# Patient Record
Sex: Male | Born: 1960 | Race: White | Hispanic: No | Smoking: Former smoker
Health system: Southern US, Community
[De-identification: ages and names within clinical notes are randomized; demographics above are authoritative.]

## PROBLEM LIST (undated history)

## (undated) DIAGNOSIS — Z125 Encounter for screening for malignant neoplasm of prostate: Secondary | ICD-10-CM

## (undated) DIAGNOSIS — N281 Cyst of kidney, acquired: Principal | ICD-10-CM

## (undated) DIAGNOSIS — I10 Essential (primary) hypertension: Secondary | ICD-10-CM

## (undated) DIAGNOSIS — M546 Pain in thoracic spine: Secondary | ICD-10-CM

## (undated) DIAGNOSIS — F40240 Claustrophobia: Secondary | ICD-10-CM

## (undated) DIAGNOSIS — K219 Gastro-esophageal reflux disease without esophagitis: Secondary | ICD-10-CM

## (undated) DIAGNOSIS — R030 Elevated blood-pressure reading, without diagnosis of hypertension: Secondary | ICD-10-CM

## (undated) DIAGNOSIS — U071 COVID-19: Secondary | ICD-10-CM

## (undated) DIAGNOSIS — M5416 Radiculopathy, lumbar region: Secondary | ICD-10-CM

## (undated) DIAGNOSIS — M5441 Lumbago with sciatica, right side: Secondary | ICD-10-CM

## (undated) DIAGNOSIS — Z765 Malingerer [conscious simulation]: Secondary | ICD-10-CM

## (undated) DIAGNOSIS — D649 Anemia, unspecified: Secondary | ICD-10-CM

## (undated) DIAGNOSIS — K649 Unspecified hemorrhoids: Secondary | ICD-10-CM

## (undated) DIAGNOSIS — M5020 Other cervical disc displacement, unspecified cervical region: Secondary | ICD-10-CM

## (undated) DIAGNOSIS — F419 Anxiety disorder, unspecified: Secondary | ICD-10-CM

## (undated) DIAGNOSIS — Z5189 Encounter for other specified aftercare: Secondary | ICD-10-CM

## (undated) HISTORY — PX: CERVICAL FUSION: SHX112

## (undated) HISTORY — DX: Encounter for other specified aftercare: Z51.89

## (undated) HISTORY — PX: SEPTOPLASTY: SUR1290

## (undated) HISTORY — PX: FRACTURE SURGERY: SHX138

---

## 1995-04-30 HISTORY — PX: OTHER SURGICAL HISTORY: SHX169

## 2000-03-16 ENCOUNTER — Encounter: Payer: Self-pay | Admitting: Emergency Medicine

## 2000-03-16 ENCOUNTER — Emergency Department (HOSPITAL_COMMUNITY): Admission: EM | Admit: 2000-03-16 | Discharge: 2000-03-16 | Payer: Self-pay | Admitting: Emergency Medicine

## 2006-10-29 ENCOUNTER — Emergency Department: Payer: Self-pay | Admitting: Internal Medicine

## 2006-10-29 ENCOUNTER — Other Ambulatory Visit: Payer: Self-pay

## 2009-11-09 ENCOUNTER — Emergency Department: Payer: Self-pay | Admitting: Emergency Medicine

## 2010-02-04 ENCOUNTER — Emergency Department: Payer: Self-pay | Admitting: Emergency Medicine

## 2010-11-07 ENCOUNTER — Emergency Department: Payer: Self-pay | Admitting: Emergency Medicine

## 2011-03-12 ENCOUNTER — Emergency Department: Payer: Self-pay | Admitting: Unknown Physician Specialty

## 2011-04-13 ENCOUNTER — Emergency Department: Payer: Self-pay

## 2011-04-13 LAB — CBC
HCT: 40.8 % (ref 40.0–52.0)
HGB: 13.5 g/dL (ref 13.0–18.0)
MCH: 30.6 pg (ref 26.0–34.0)
MCHC: 33.1 g/dL (ref 32.0–36.0)
MCV: 93 fL (ref 80–100)
Platelet: 188 10*3/uL (ref 150–440)
RBC: 4.4 10*6/uL (ref 4.40–5.90)
RDW: 13.8 % (ref 11.5–14.5)
WBC: 11.9 10*3/uL — ABNORMAL HIGH (ref 3.8–10.6)

## 2011-04-13 LAB — URINALYSIS, COMPLETE
Bilirubin,UR: NEGATIVE
Glucose,UR: NEGATIVE mg/dL (ref 0–75)
Hyaline Cast: 2
Leukocyte Esterase: NEGATIVE
Nitrite: NEGATIVE
Ph: 5 (ref 4.5–8.0)
Protein: 100
RBC,UR: 3 /HPF (ref 0–5)
Specific Gravity: 1.029 (ref 1.003–1.030)
Squamous Epithelial: NONE SEEN
WBC UR: 3 /HPF (ref 0–5)

## 2011-04-13 LAB — LIPASE, BLOOD: Lipase: 167 U/L (ref 73–393)

## 2011-04-13 LAB — COMPREHENSIVE METABOLIC PANEL
Albumin: 3.4 g/dL (ref 3.4–5.0)
Alkaline Phosphatase: 105 U/L (ref 50–136)
Anion Gap: 10 (ref 7–16)
BUN: 19 mg/dL — ABNORMAL HIGH (ref 7–18)
Bilirubin,Total: 0.7 mg/dL (ref 0.2–1.0)
Calcium, Total: 8.7 mg/dL (ref 8.5–10.1)
Chloride: 99 mmol/L (ref 98–107)
Co2: 29 mmol/L (ref 21–32)
Creatinine: 1.28 mg/dL (ref 0.60–1.30)
EGFR (African American): >60
EGFR (Non-African Amer.): >60
Glucose: 102 mg/dL — ABNORMAL HIGH (ref 65–99)
Osmolality: 278 (ref 275–301)
Potassium: 3.6 mmol/L (ref 3.5–5.1)
SGOT(AST): 43 U/L — ABNORMAL HIGH (ref 15–37)
SGPT (ALT): 40 U/L
Sodium: 138 mmol/L (ref 136–145)
Total Protein: 7.4 g/dL (ref 6.4–8.2)

## 2014-01-22 ENCOUNTER — Inpatient Hospital Stay: Payer: Self-pay | Admitting: Internal Medicine

## 2014-01-22 LAB — BASIC METABOLIC PANEL
Anion Gap: 7 (ref 7–16)
BUN: 6 mg/dL — AB (ref 7–18)
CREATININE: 0.9 mg/dL (ref 0.60–1.30)
Calcium, Total: 8.4 mg/dL — ABNORMAL LOW (ref 8.5–10.1)
Chloride: 105 mmol/L (ref 98–107)
Co2: 27 mmol/L (ref 21–32)
EGFR (African American): >60
EGFR (Non-African Amer.): >60
Glucose: 107 mg/dL — ABNORMAL HIGH (ref 65–99)
Osmolality: 276 (ref 275–301)
POTASSIUM: 3.8 mmol/L (ref 3.5–5.1)
Sodium: 139 mmol/L (ref 136–145)

## 2014-01-22 LAB — CBC WITH DIFFERENTIAL/PLATELET
BASOS ABS: 2 %
HCT: 24.4 % — ABNORMAL LOW (ref 40.0–52.0)
HGB: 6.9 g/dL — AB (ref 13.0–18.0)
Lymphocytes: 20 %
MCH: 19.6 pg — ABNORMAL LOW (ref 26.0–34.0)
MCHC: 28.1 g/dL — ABNORMAL LOW (ref 32.0–36.0)
MCV: 70 fL — ABNORMAL LOW (ref 80–100)
MONOS PCT: 6 %
Platelet: 309 10*3/uL (ref 150–440)
RBC: 3.5 10*6/uL — ABNORMAL LOW (ref 4.40–5.90)
RDW: 17.2 % — ABNORMAL HIGH (ref 11.5–14.5)
SEGMENTED NEUTROPHILS: 72 %
WBC: 5 10*3/uL (ref 3.8–10.6)

## 2014-01-22 LAB — URINALYSIS, COMPLETE
BLOOD: NEGATIVE
Bacteria: NONE SEEN
Bilirubin,UR: NEGATIVE
Glucose,UR: NEGATIVE mg/dL (ref 0–75)
Ketone: NEGATIVE
Leukocyte Esterase: NEGATIVE
Nitrite: NEGATIVE
PH: 6 (ref 4.5–8.0)
PROTEIN: NEGATIVE
RBC,UR: NONE SEEN /HPF (ref 0–5)
SPECIFIC GRAVITY: 1.003 (ref 1.003–1.030)
Squamous Epithelial: NONE SEEN
WBC UR: <1 /HPF (ref 0–5)

## 2014-01-22 LAB — TROPONIN I: Troponin-I: <0.02 ng/mL

## 2014-01-23 LAB — BASIC METABOLIC PANEL
ANION GAP: 7 (ref 7–16)
BUN: 5 mg/dL — ABNORMAL LOW (ref 7–18)
CALCIUM: 8 mg/dL — AB (ref 8.5–10.1)
CO2: 28 mmol/L (ref 21–32)
Chloride: 107 mmol/L (ref 98–107)
Creatinine: 0.84 mg/dL (ref 0.60–1.30)
Glucose: 100 mg/dL — ABNORMAL HIGH (ref 65–99)
OSMOLALITY: 280 (ref 275–301)
Potassium: 3.9 mmol/L (ref 3.5–5.1)
Sodium: 142 mmol/L (ref 136–145)

## 2014-01-23 LAB — CBC WITH DIFFERENTIAL/PLATELET
BASOS ABS: 0.1 10*3/uL (ref 0.0–0.1)
Basophil %: 1.5 %
EOS ABS: 0.1 10*3/uL (ref 0.0–0.7)
Eosinophil %: 1.3 %
HCT: 23.7 % — ABNORMAL LOW (ref 40.0–52.0)
HGB: 6.9 g/dL — AB (ref 13.0–18.0)
LYMPHS ABS: 1.5 10*3/uL (ref 1.0–3.6)
Lymphocyte %: 28 %
MCH: 20.8 pg — ABNORMAL LOW (ref 26.0–34.0)
MCHC: 29 g/dL — ABNORMAL LOW (ref 32.0–36.0)
MCV: 72 fL — ABNORMAL LOW (ref 80–100)
MONO ABS: 0.7 x10 3/mm (ref 0.2–1.0)
Monocyte %: 12.7 %
NEUTROS ABS: 3.1 10*3/uL (ref 1.4–6.5)
Neutrophil %: 56.5 %
Platelet: 271 10*3/uL (ref 150–440)
RBC: 3.3 10*6/uL — AB (ref 4.40–5.90)
RDW: 17.8 % — ABNORMAL HIGH (ref 11.5–14.5)
WBC: 5.5 10*3/uL (ref 3.8–10.6)

## 2014-01-23 LAB — HEMOGLOBIN
HGB: 7.9 g/dL — ABNORMAL LOW (ref 13.0–18.0)
HGB: 9 g/dL — AB (ref 13.0–18.0)

## 2014-01-23 LAB — IRON: Iron: 141 ug/dL (ref 65–175)

## 2014-01-23 LAB — HEMATOCRIT
HCT: 27.8 % — AB (ref 40.0–52.0)
HCT: 30.9 % — ABNORMAL LOW (ref 40.0–52.0)

## 2014-01-23 LAB — FERRITIN: Ferritin (ARMC): 3 ng/mL — ABNORMAL LOW (ref 8–388)

## 2014-01-24 ENCOUNTER — Inpatient Hospital Stay: Payer: Self-pay | Admitting: Internal Medicine

## 2014-01-24 LAB — CBC WITH DIFFERENTIAL/PLATELET
BASOS ABS: 0.1 10*3/uL (ref 0.0–0.1)
BASOS ABS: 0.2 10*3/uL — AB (ref 0.0–0.1)
Basophil %: 1.4 %
Basophil %: 2.3 %
EOS ABS: 0.1 10*3/uL (ref 0.0–0.7)
EOS PCT: 2.1 %
Eosinophil #: 0.1 10*3/uL (ref 0.0–0.7)
Eosinophil %: 2.1 %
HCT: 24.3 % — AB (ref 40.0–52.0)
HCT: 30 % — ABNORMAL LOW (ref 40.0–52.0)
HGB: 7.1 g/dL — ABNORMAL LOW (ref 13.0–18.0)
HGB: 8.6 g/dL — ABNORMAL LOW (ref 13.0–18.0)
LYMPHS PCT: 27.5 %
Lymphocyte #: 2 10*3/uL (ref 1.0–3.6)
Lymphocyte #: 1.6 10*3/uL (ref 1.0–3.6)
Lymphocyte %: 21.7 %
MCH: 21.6 pg — ABNORMAL LOW (ref 26.0–34.0)
MCH: 21.4 pg — ABNORMAL LOW (ref 26.0–34.0)
MCHC: 29.3 g/dL — AB (ref 32.0–36.0)
MCHC: 28.8 g/dL — AB (ref 32.0–36.0)
MCV: 74 fL — ABNORMAL LOW (ref 80–100)
MCV: 75 fL — AB (ref 80–100)
MONOS PCT: 11.6 %
Monocyte #: 0.8 x10 3/mm (ref 0.2–1.0)
Monocyte #: 0.7 x10 3/mm (ref 0.2–1.0)
Monocyte %: 10.3 %
NEUTROS ABS: 4.1 10*3/uL (ref 1.4–6.5)
Neutrophil #: 4.6 10*3/uL (ref 1.4–6.5)
Neutrophil %: 57.4 %
Neutrophil %: 63.6 %
PLATELETS: 252 10*3/uL (ref 150–440)
Platelet: 327 10*3/uL (ref 150–440)
RBC: 3.3 10*6/uL — ABNORMAL LOW (ref 4.40–5.90)
RBC: 4.03 10*6/uL — ABNORMAL LOW (ref 4.40–5.90)
RDW: 18.9 % — AB (ref 11.5–14.5)
RDW: 19.1 % — AB (ref 11.5–14.5)
WBC: 7.1 10*3/uL (ref 3.8–10.6)
WBC: 7.2 10*3/uL (ref 3.8–10.6)

## 2014-01-24 LAB — HEMOGLOBIN: HGB: 8.8 g/dL — ABNORMAL LOW (ref 13.0–18.0)

## 2014-01-24 LAB — COMPREHENSIVE METABOLIC PANEL
ANION GAP: 5 — AB (ref 7–16)
Albumin: 3.8 g/dL (ref 3.4–5.0)
Alkaline Phosphatase: 71 U/L
BILIRUBIN TOTAL: 1.2 mg/dL — AB (ref 0.2–1.0)
BUN: 7 mg/dL (ref 7–18)
CALCIUM: 8.5 mg/dL (ref 8.5–10.1)
CHLORIDE: 106 mmol/L (ref 98–107)
CO2: 30 mmol/L (ref 21–32)
CREATININE: 0.86 mg/dL (ref 0.60–1.30)
Glucose: 100 mg/dL — ABNORMAL HIGH (ref 65–99)
Osmolality: 279 (ref 275–301)
Potassium: 4.3 mmol/L (ref 3.5–5.1)
SGOT(AST): 21 U/L (ref 15–37)
SGPT (ALT): 17 U/L
Sodium: 141 mmol/L (ref 136–145)
Total Protein: 7.2 g/dL (ref 6.4–8.2)

## 2014-01-24 LAB — TROPONIN I

## 2014-01-25 LAB — CBC WITH DIFFERENTIAL/PLATELET
BASOS PCT: 0.7 %
Basophil #: 0.1 10*3/uL (ref 0.0–0.1)
EOS PCT: 0.7 %
Eosinophil #: 0.1 10*3/uL (ref 0.0–0.7)
HCT: 27.5 % — AB (ref 40.0–52.0)
HGB: 8.1 g/dL — AB (ref 13.0–18.0)
LYMPHS ABS: 1.4 10*3/uL (ref 1.0–3.6)
Lymphocyte %: 10.1 %
MCH: 21.7 pg — ABNORMAL LOW (ref 26.0–34.0)
MCHC: 29.5 g/dL — ABNORMAL LOW (ref 32.0–36.0)
MCV: 73 fL — AB (ref 80–100)
Monocyte #: 0.9 x10 3/mm (ref 0.2–1.0)
Monocyte %: 7.1 %
Neutrophil #: 10.9 10*3/uL — ABNORMAL HIGH (ref 1.4–6.5)
Neutrophil %: 81.4 %
PLATELETS: 312 10*3/uL (ref 150–440)
RBC: 3.74 10*6/uL — AB (ref 4.40–5.90)
RDW: 19.4 % — ABNORMAL HIGH (ref 11.5–14.5)
WBC: 13.3 10*3/uL — ABNORMAL HIGH (ref 3.8–10.6)

## 2014-01-25 LAB — BASIC METABOLIC PANEL
ANION GAP: 8 (ref 7–16)
BUN: 9 mg/dL (ref 7–18)
CO2: 28 mmol/L (ref 21–32)
Calcium, Total: 8.7 mg/dL (ref 8.5–10.1)
Chloride: 106 mmol/L (ref 98–107)
Creatinine: 0.8 mg/dL (ref 0.60–1.30)
EGFR (African American): >60
EGFR (Non-African Amer.): >60
Glucose: 87 mg/dL (ref 65–99)
OSMOLALITY: 281 (ref 275–301)
Potassium: 4 mmol/L (ref 3.5–5.1)
Sodium: 142 mmol/L (ref 136–145)

## 2014-01-30 ENCOUNTER — Emergency Department: Payer: Self-pay | Admitting: Emergency Medicine

## 2014-01-30 LAB — BASIC METABOLIC PANEL
Anion Gap: 8 (ref 7–16)
BUN: 11 mg/dL (ref 7–18)
CREATININE: 0.81 mg/dL (ref 0.60–1.30)
Calcium, Total: 9.1 mg/dL (ref 8.5–10.1)
Chloride: 104 mmol/L (ref 98–107)
Co2: 28 mmol/L (ref 21–32)
GLUCOSE: 95 mg/dL (ref 65–99)
OSMOLALITY: 279 (ref 275–301)
Potassium: 4 mmol/L (ref 3.5–5.1)
Sodium: 140 mmol/L (ref 136–145)

## 2014-01-30 LAB — CBC WITH DIFFERENTIAL/PLATELET
Basophil #: 0.1 10*3/uL (ref 0.0–0.1)
Basophil %: 1.1 %
EOS ABS: 0 10*3/uL (ref 0.0–0.7)
EOS PCT: 0.5 %
HCT: 32.4 % — ABNORMAL LOW (ref 40.0–52.0)
HGB: 9.7 g/dL — AB (ref 13.0–18.0)
Lymphocyte #: 1.4 10*3/uL (ref 1.0–3.6)
Lymphocyte %: 18.4 %
MCH: 22 pg — ABNORMAL LOW (ref 26.0–34.0)
MCHC: 29.8 g/dL — ABNORMAL LOW (ref 32.0–36.0)
MCV: 74 fL — ABNORMAL LOW (ref 80–100)
Monocyte #: 0.7 x10 3/mm (ref 0.2–1.0)
Monocyte %: 9.6 %
NEUTROS PCT: 70.4 %
Neutrophil #: 5.4 10*3/uL (ref 1.4–6.5)
Platelet: 314 10*3/uL (ref 150–440)
RBC: 4.39 10*6/uL — ABNORMAL LOW (ref 4.40–5.90)
RDW: 21.2 % — AB (ref 11.5–14.5)
WBC: 7.7 10*3/uL (ref 3.8–10.6)

## 2014-01-30 LAB — TROPONIN I: Troponin-I: <0.02 ng/mL

## 2014-02-05 HISTORY — PX: COLONOSCOPY: SHX174

## 2014-03-06 ENCOUNTER — Encounter: Payer: Self-pay | Admitting: *Deleted

## 2014-03-15 ENCOUNTER — Ambulatory Visit (INDEPENDENT_AMBULATORY_CARE_PROVIDER_SITE_OTHER): Payer: Medicaid Other | Admitting: General Surgery

## 2014-03-15 ENCOUNTER — Encounter: Payer: Self-pay | Admitting: General Surgery

## 2014-03-15 VITALS — BP 104/58 | HR 84 | Resp 14 | Ht 69.0 in | Wt 144.0 lb

## 2014-03-15 DIAGNOSIS — K625 Hemorrhage of anus and rectum: Secondary | ICD-10-CM | POA: Diagnosis not present

## 2014-03-15 DIAGNOSIS — K648 Other hemorrhoids: Secondary | ICD-10-CM | POA: Diagnosis not present

## 2014-03-15 NOTE — Progress Notes (Signed)
Patient ID: Shane Munsonimothy Aubrey Murtaugh, male   DOB: 06/29/1960, 54 y.o.   MRN: [id number]  Chief Complaint  Patient presents with  . Rectal Problems    hemorrhoids    HPI Shane [name] is a 54 y.o. male  Here for problems with hemorrhoids. He has had problems with bleeding hemorrhoids since 1999. He has a history of repaired prolapsed rectum in 1997 and has had bleeding since then. He states that this comes and goes and can be a lot at times. He had a colonoscopy done in February 2016 and only hemorrhoids were found. He started taking stool softeners about 2 weeks ago. He reports the bleeding is greatly reduced after starting stool softeners.   The [facility] was reviewed. He was hospitalized and transfused in 2005. He presented with a history of rectal bleeding on January 18 at that time his hemoglobin was found to be 6.9. He was transfused and refused colonoscopy, planning to [facility]. He left AGAINST MEDICAL ADVICE returning 2 hours later because he was dizzy. No change in hemoglobin at that time (8.8 post transfusion). He subsequently did have a colonoscopy completed by Lynnae Prudeobert Elliott, M.D. Large internal hemorrhoids were identified that did not prolapse. Scant active bleeding.    HPI  Past Medical History  Diagnosis Date  . Encounter for blood transfusion 2005, 02/2014    Past Surgical History  Procedure Laterality Date  . Colonoscopy  2/16    Dr. Mechele CollinElliott  . Prolapsed rectum surgery  04/30/1995    [facility], trans abdominal mesh repair per patient report    No family history on file.  Social History History  Substance Use Topics  . Smoking status: Former Smoker -- 20 years    Quit date: 01/05/2005  . Smokeless tobacco: Current User  . Alcohol Use: 0.0 oz/week    0 Standard drinks or equivalent per week    Allergies  Allergen Reactions  . Codeine     Stomach pain     Current Outpatient Prescriptions  Medication Sig Dispense  Refill  . docusate sodium (COLACE) 100 MG capsule Take 250 mg by mouth 2 (two) times daily.     Marland Kitchen. HYDROcodone-acetaminophen (NORCO) 10-325 MG per tablet Take 1 tablet by mouth every 6 (six) hours.  0  . methocarbamol (ROBAXIN) 750 MG tablet      No current facility-administered medications for this visit.    Review of Systems Review of Systems  Constitutional: Positive for unexpected weight change (weight loss). Negative for fever, chills, diaphoresis, activity change, appetite change and fatigue.  Respiratory: Negative.   Cardiovascular: Negative.   Gastrointestinal: Positive for anal bleeding. Negative for nausea, vomiting, abdominal pain, diarrhea, constipation, blood in stool, abdominal distention and rectal pain.    Blood pressure 104/58, pulse 84, resp. rate 14, height 5\' 9"  (1.753 m), weight 144 lb (65.318 kg).  Physical Exam Physical Exam  Constitutional: He is oriented to person, place, and time. He appears well-developed and well-nourished.  Cardiovascular: Normal rate and regular rhythm.   Pulmonary/Chest: Effort normal and breath sounds normal.  Abdominal: A hernia is present. Hernia confirmed positive in the right inguinal area (Modest bulge with straining at the external ring. Reducible and standing position.) and confirmed positive in the left inguinal area (small impulse at the left internal ring with straining.).    Neurological: He is alert and oriented to person, place, and time.   Anoscopy was completed. The visualized lower rectal mucosa was  unremarkable. There were no clearly identifiable internal hemorrhoids although there is somewhat prominent mucosa just at the dentate line. No bleeding noted. No prolapse with vigorous straining.  Data Reviewed Discharge CBC dated 01/30/2014 showed a hemoglobin of 9.7 with an MCV of 74, white blood cell count 7700. Normal platelet count.  Laboratory studies dated 02/20/2014 from Dr. Jake Church office showed a hemoglobin of 11.6  with an MCV of 82. Platelet count of 316,000. Normal electrolytes and renal function.  Assessment    Recurrent rectal bleeding likely secondary to internal hemorrhoids, now resolved.  Long-standing inguinal hernias without change.  Reported pending disc surgery for chronic back pain.    Plan    The patient finds it making use of stool softeners results in marked improvement in his rectal bleeding. This has been encouraged. He should go ahead with planned spinal surgery with the idea that discontinuation of chronic narcotics will improve his bowel lamination and lessen the likelihood of straining.  With minimal findings on anoscopy today, no intervention is required.  The small, long-standing hernias by patient report, did not require urgent intervention.    Ref: Dr Schuyler Amor, Merrily Pew 03/17/2014, 8:46 AM

## 2014-03-17 ENCOUNTER — Encounter: Payer: Self-pay | Admitting: General Surgery

## 2014-05-06 NOTE — Discharge Summary (Signed)
PATIENT NAME:  Shane [name], Juda A MR#:  [id number] DATE OF BIRTH:  1960-07-17  ADMITTING DIAGNOSIS:  Bright red blood per rectum.   DISCHARGE DIAGNOSES: 1.  Bright red blood per rectum, possible internal hemorrhoids, possible recurrence of his rectal prolapse. The patient recommended to have a colonoscopy by gastroenterology, but he did not want to have a colonoscopy here. He wanted to [facility].  I called [facility] to [facility] to transfer the patient, but they are on diversion, so patient wanted me to discharge him home.  2.  Acute blood loss anemia, status post transfusion.  3.  Near syncope due to anemia.  4.  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  PERTINENT LABORATORY AND EVALUATIONS: Admitting glucose 107, BUN 6, creatinine 0.90, sodium 139, potassium 3.8, chloride 105, CO2 of 27, calcium 8.4, troponin less than 0.02. WBC 5.0, hemoglobin 6.9, platelet count was 309,000.  Most recent hemoglobin at 10:42 was 8.8.   HOSPITAL COURSE: Please refer to H and P done by the admitting physician. The patient is a 54 year old white male with previous history of lower GI bleeding related to internal hemorrhoids, prolapsed rectum, who presented with bright red blood per rectum ongoing for a while. The patient was admitted and transfused. He was symptomatic with his anemia. The patient also was seen by Dr. Mechele CollinElliott, who recommended a colonoscopy on Thursday. However, the patient stated that he wants to have all of his workup done [facility]. Since his hemoglobin is stable, he is not having any active bleeding, he is being discharged.  I also attempted to call [facility], but they were on diversion. He is doing well and is stable for discharge.   DISCHARGE MEDICATIONS: Methocarbamol 750, 1/2 tab p.o. at bedtime, acetaminophen and hydrocodone 325/10, 0.5-1 tab p.o. q. 6 p.r.n. for pain, Colace 100, 1 tab p.o. b.i.d. p.r.n. for constipation, ranitidine 150 daily.   DIET: Low sodium.   ACTIVITY:  As tolerated.   FOLLOWUP:  With primary MD in 1-2 weeks, if not [facility]. If rebleeds, will need to go to [facility] since he does not want to have evaluation here.   TIME SPENT: 35 minutes.    ____________________________ Lacie ScottsShreyang H. Allena KatzPatel, MD shp:LT D: 01/24/2014 14:01:03 ET T: 01/24/2014 16:19:37 ET JOB#: [id number]  cc: Allen Egerton H. Allena KatzPatel, MD, <Dictator> Charise CarwinSHREYANG H Selicia Windom MD ELECTRONICALLY SIGNED 02/01/2014 10:00

## 2014-05-06 NOTE — Discharge Summary (Signed)
PATIENT NAME:  Shane [name], Shane [name] MR#:  [id number] DATE OF BIRTH:  08-Sep-1960  DATE OF ADMISSION:  01/24/2014 DATE OF DISCHARGE:  01/25/2014  ADMITTING DIAGNOSES:  1.  Symptomatic anemia, likely from rectal bleed.  2.  Rectal bleed, likely from underlying hemorrhoids. 3.  Dizziness versus presyncope from severe anemia. 4.  Chronic neck pain.  DISCHARGE DIAGNOSES:  1.  Symptomatic anemia, likely from rectal bleed.  2.  Rectal bleed from underlying internal hemorrhoids.  3.  Presyncope versus dizziness, improved with intravenous fluids.  4.  Chronic neck pain.   PROCEDURES: Colonoscopy performed by Dr. Mechele Collin on 01/25/2014.   CONSULTATIONS: Gastroenterology.  BRIEF HISTORY AND PHYSICAL AND HOSPITAL COURSE: The patient is a 54 year old Caucasian male with a history of internal hemorrhoids and hemorrhoidal bleed who came into the ED for rectal bleeding from January 18. He left the hospital against medical advice with the hope that he will get colonoscopy done at [facility]. He left the hospital and within 2 hours he went to [employer], feeling dizzy, and came back to the ED. His hemoglobin went down  Please review history and physical for details. The patient is admitted to the hospital for dizziness, symptomatic anemia, and rectal bleed. He was placed on IV fluids and started on GoLYTELY for possible colonoscopy. Admitting physician, Dr. Sherryll Burger, has discussed with the on-call GI doctor, Dr. Bluford Kaufmann, and scheduled for colonoscopy. The patient's hemoglobin and hematocrit were monitored closely. The patient had colonoscopy done today by Dr. Mechele Collin. Colonoscopy revealed internal hemorrhoids. Dr. Mechele Collin has recommended the patient to follow up with surgery regarding his hemorrhoids. The patient's dizziness is resolved with IV fluids. Hemoglobin being stable at 8.1 , no blood transfusions were provided. The patient was recommended to take iron supplements. GI has recommended that the patient can be discharged from their  standpoint. The patient was tolerating the diet well and no other episodes of bleeding were noted. Patient was ambulating in the hallway without any difficulty. Denies any abdominal pain. The patient was recommended to take iron supplements and over-the-counter Preparation H or Anusol rectal suppositories regarding his internal hemorrhoids. The patient was discharged home in stable condition. His vital signs were stable.   LABORATORY DATA AND IMAGING STUDIES: The patient's hemoglobin was at 8.8, 8.6, and 8.1. White count was elevated at 13.3, probably reactive.  The patient was afebrile. BMP was normal.  MEDICATIONS AT THE TIME OF DISCHARGE: Iron supplement Feosol over the counter 1 tablet p.o. once daily, hydrocortisone 25 mg rectal suppository rectally 2 times a day as needed for internal hemorrhoids, ranitidine 150 mg 1 tablet p.o. once daily in a.m., Colace 100 mg 1 tablet p.o. 2 times a day as needed for constipation, Tylenol with hydrocodone 325/10 mg 1/2 tablet to 1 tablet p.o. every 6 hours as needed for pain, methocarbamol 750 mg 1/2 tablet p.o. once daily at bedtime.  CODE STATUS: Full code.   DIET: Regular. Regular consistency.   ACTIVITY: As tolerated.   FOLLOWUP: Follow up with the primary care physician in a week, general surgery regarding internal hemorrhoids in 1 to 2 weeks as an outpatient, and gastroenterology in 2 to 4 weeks as needed.   Diagnosis and plan of care were discussed with the patient. He verbalized understanding of the plan.  TOTAL TIME SPENT ON THE DISCHARGE AND COORDINATION OF CARE: 45 minutes.   ____________________________ Ramonita Lab, MD ag:ST D: 01/25/2014 21:25:54 ET T: 01/25/2014 22:52:16 ET JOB#: [id number]  cc: Ramonita Lab, MD, <Dictator> Primary care physician  Gastroenterology Ramonita LabARUNA Kashia Brossard MD ELECTRONICALLY SIGNED 01/26/2014 19:03

## 2014-05-06 NOTE — Discharge Summary (Signed)
PATIENT NAME:  [name], Shane A MR#:  [id number] DATE OF BIRTH:  Mar 21, 1960  DATE OF ADMISSION:  01/22/2014 DATE OF DISCHARGE:  01/24/2014  ADMITTING DIAGNOSIS: Bright red blood per rectum.   DISCHARGE DIAGNOSES:  1.  Bright red blood per rectum due to a lower gastrointestinal bleed, possible causes include internal hemorrhoids and recurrence of rectal prolapse. The patient refused colonoscopy here. He stated that he will [facility] and [facility] this done.  2.  Acute blood loss anemia, symptomatic status post transfusion, hemoglobin stable.  3.  Chronic history of internal hemorrhoids.  4.  History of rectal prolapse.  5.  Chronic neck pain.  6.  History of lower gastrointestinal bleed due to internal hemorrhoids and prolapse in the past status post rectal prolapse surgery.   CONSULTANTS: Dr. Mechele CollinElliott.    PERTINENT LABORATORY AND EVALUATIONS:  Admitting glucose 107, BUN was 6, creatinine 0.90, sodium 139, potassium 3.8, hemoglobin was 6.9, platelet count was 309, and hemoglobin on discharge was 8.6.   HOSPITAL COURSE: Please refer to H and P done by me on admission. The patient is a 54 year old white male who was admitted with bright red blood per rectum. He has a history of internal hemorrhoids. He was admitted for further evaluation and treatment. The patient was transfused a total 2 units of packed RBCs and was seen in consultation by GI. He was recommended to stay in the hospital and have a colonoscopy; however, he stated that he wanted to [facility].  He wanted to be transferred there.  I recommended that he needed a colonoscopy regardless. The patient stated that I should call [facility].  I called [facility] for transfer; however, they were on diversion. He was very anxious and wanted to leave. So at this point he is being discharged to be followed up there and have his further evaluation. The patient was very anxious during the hospitalization and questioning his hemoglobin and other things. He is  stable for discharge.  DISCHARGE MEDICATIONS:  Methocarbamol 750 mg 0.5 at bedtime, acetaminophen hydrocodone 325/10 mg, 0.5 to 1 tab p.o. q.6 p.r.n. for pain, Colace 100 mg 1 tab p.o. b.i.d. p.r.n. for constipation, ranitidine 150 mg daily.   DIET: Low-sodium.   ACTIVITY: As tolerated.   FOLLOW UP:  Primary M.D. in 1 to 2 weeks, [facility] if no other M.D.    TIME SPENT: 35 minutes on this patient.    ____________________________ Lacie ScottsShreyang H. Allena KatzPatel, MD shp:at D: 01/25/2014 08:39:22 ET T: 01/25/2014 10:24:42 ET JOB#: [id number]  cc: Geet Hosking H. Allena KatzPatel, MD, <Dictator> Charise CarwinSHREYANG H Kennidee Heyne MD ELECTRONICALLY SIGNED 02/01/2014 10:01

## 2014-05-06 NOTE — Consult Note (Signed)
Pt with rectal bleeding and anemia with feeling weak and dizzy, hgb was 6.9 but now up to 9 with 2 units.  He had a large meal today so i will have to postpone his colonoscopy to Thursday.  Will prep tomorrow.  Possible neoplasm, possible prolapse, possible large internal hemorrhoids. etc.  Mom died of colon cancer.  Electronic Signatures: Scot JunElliott, Robert T (MD)  (Signed on 19-Jan-16 18:44)  Authored  Last Updated: 19-Jan-16 18:44 by Scot JunElliott, Robert T (MD)

## 2014-05-06 NOTE — H&P (Signed)
PATIENT NAME:  Shane [name], Shane A MR#:  [id number] DATE OF BIRTH:  05/04/1960  DATE OF ADMISSION:  01/24/2014  PRIMARY CARE PHYSICIAN:  Nonlocal.   REQUESTING PHYSICIAN:  Gwynneth AlbrightPhillip A Stafford, MD   CHIEF COMPLAINT: Dizziness.   HISTORY OF PRESENT ILLNESS: The patient is a 54 year old male with a known history of internal hemorrhoids and hemorrhoidal bleed who was in the hospital for rectal bleed from January 18, until this afternoon, when he left against medical advice, with the hope that he will get [facility]. He left less than 2 hours ago went to [employer], to exchange some of his clothes, got extremely dizzy and decided that he will not be able to make it to [facility], and came back to the Emergency Department. He is being readmitted for evaluation of rectal bleed. His hemoglobin is down to 8.6, from 8.8, this morning. He is agreeable to get colonoscopy here. He does feel dizzy but much better right now in the emergency bed. He does have tangential thought, and is going all over the place while talking in different subjects.   PAST MEDICAL HISTORY:  1.  Internal hemorrhoids.  2.  Hemorrhoidal bleed.  3.  Chronic neck pain.  4.  Rectal prolapse.   PAST SURGICAL HISTORY:  Rectal prolapse surgery.   ALLERGIES: AMOXICILLIN AND CODEINE.   PSYCHOSOCIAL HISTORY: Lives alone, no history of smoking, occasional alcohol, no illicit drug use.   FAMILY HISTORY: Mother died of intestinal cancer, history of diabetes mellitus also present.   REVIEW OF SYSTEMS:   CONSTITUTIONAL: No fever, positive fatigue and weakness.  EYES: No blurred or double vision.  ENT: No tinnitus or ear pain.  RESPIRATORY: No cough, wheezing, hemoptysis.  CARDIOVASCULAR: No chest pain, orthopnea, edema.  GASTROINTESTINAL: No nausea, vomiting, diarrhea; positive for rectal bleed.  GENITOURINARY:  No dysuria, hematuria.  ENDOCRINE: No polyuria or nocturia.  HEMATOLOGY: Positive for anemia and gastrointestinal bleed.   SKIN: No rash or lesion.  MUSCULOSKELETAL: No arthritis or muscle cramp.  NEUROLOGIC: No tingling or numbness, positive for dizziness.  PSYCHIATRIC: No history of anxiety or depression.   MEDICATIONS:  At home: 1.  Ranitidine 150 mg p.o. daily.  2.  Methocarbamol 0.5 mg p.o. daily.  3.  Colace 100 mg p.o. b.i.d.  4.  Acetaminophen/hydrocodone 325/10 mg 1 tablet p.o. every 6 hours as needed.   PHYSICAL EXAMINATION: VITAL SIGNS: Temperature 97.7, heart rate 92, per minute; respirations 16, per minute; blood pressure 149/94, he is saturating 100% on room air.  GENERAL: The patient is a 54 year old male lying in the bed comfortably without any acute distress.  EYES: Pupils are equal, round and reactive to light and accommodation, no scleral icterus, extraocular muscles intact.  HEENT: Head atraumatic, normocephalic, oropharynx and nasopharynx clear.  NECK: Supple. No jugular venous distention, No thyroid enlargement or tenderness.  LUNGS: Clear auscultation bilaterally. No wheezing, rales, rhonchi or crepitation.  CARDIOVASCULAR: S1, S2, normal; no murmurs, rubs, or gallops.  ABDOMEN: Soft, nontender, nondistended, bowel sounds present, no organomegaly or mass.  EXTREMITIES: No peripheral edema, cyanosis or clubbing.  NEUROLOGIC: Cranial nerves III through XII intact, muscle strength 5/5 in extremities, sensation intact.  PSYCHIATRIC: The patient is alert and oriented x 3.  SKIN: No obvious rash, lesion, or ulcer.  MUSCULOSKELETAL: No joint effusion, or tenderness.   LABORATORY PANEL: Normal BMP, normal liver function tests, normal troponin, CBC within normal limits except hemoglobin of 8.6, hematocrit 30.0.   IMPRESSION AND PLAN: 1.  Symptomatic  anemia, likely from rectal bleed. Does not need any transfusion at this point; we will monitor his hemoglobin, recheck in the morning. He has not had any further bleed at this point.  2.  Rectal bleed, likely from underlying hemorrhoids. He does  have a strong family history of gastrointestinal cancer, he was already scheduled for colonoscopy tomorrow. I have discussed the case with Dr. Lutricia Feil, who will put him on scheduled for tomorrow for colonoscopy. The patient is agreeable. I will start him on GoLYTELY at this time.  3.  Dizziness/presyncope, likely from severe anemia. We will hydrate him and monitor for need for blood transfusion if need.  4.  Chronic neck pain. We will continue his Percocet as needed basis.   CODE STATUS: Full code.   Total time taking care of this patient is 40 minutes.    ____________________________ Ellamae Sia. Sherryll Burger, MD vss:nt D: 01/24/2014 19:17:16 ET T: 01/24/2014 19:36:15 ET JOB#: [id number]  cc: Shaylee Stanislawski S. Sherryll Burger, MD, <Dictator> Scot Jun, MD Ellamae Sia [facility] MD ELECTRONICALLY SIGNED 01/25/2014 10:22

## 2014-05-06 NOTE — H&P (Signed)
PATIENT NAME:  Shane [name], Shane [name] MR#:  [id number] DATE OF BIRTH:  April 26, 1960  DATE OF ADMISSION:  01/22/2014  PRIMARY CARE PHYSICIAN: Nonlocal.    CHIEF COMPLAINT:  Lower GI bleed.  HISTORY OF PRESENT ILLNESS:  The patient is [name] 54 year old pleasant Caucasian man with [name] history of rectal prolapse in the past, internal hemorrhoids, is presenting to the ED with [name] chief complaint of lower GI bleed. The patient reports that he has been squirting fresh blood from the lower gastrointestinal tract whenever he is bearing down. He has chronic history of internal hemorrhoids and he bleeds on and off. In the year 2005, he was admitted to [facility] and had blood transfusion for the same reason.  In the year 2013, his hemoglobin was at 13.5, but today it is at 6.9. The patient is feeling weak and dizzy.  Denies any loss of consciousness. No abdominal pain. Denies any nausea, vomiting or hematemesis. He is not acutely bleeding.  He bleeds whenever he bears down. No other complaints in the ED.  Blood transfusion consent was done by the ED physician and the patient is going to get 1 unit of blood transfusion.   PAST MEDICAL HISTORY: Chronic history of internal hemorrhoids, history of hemorrhoidal bleed in the past, chronic neck pain, rectal prolapse.   PAST SURGICAL HISTORY: Rectal prolapse surgery.   ALLERGIES: AMOXICILLIN AND CODEINE.   PSYCHOSOCIAL HISTORY: Lives alone. No history of smoking. Occasional alcohol. Denies any illicit drug usage.   FAMILY HISTORY: Mother deceased with intestinal cancer and has [name] history of diabetes mellitus.   REVIEW OF SYSTEMS:  CONSTITUTIONAL: Denies any fever. Complaining of fatigue and weakness.  EYES: Denies blurry vision, double vision.  EARS, NOSE, THROAT: Denies epistaxis or discharge.  RESPIRATION: Denies cough, COPD.   CARDIOVASCULAR: No chest pain, palpitations.  Complaining of dizziness.  No syncope.   GASTROINTESTINAL: Denies nausea, vomiting,  diarrhea.  No abdominal pain. Complaining of fresh rectal bleed whenever he is bearing down.  Has history of internal hemorrhoids.  GENITOURINARY: No dysuria or hematuria or renal calculi.  ENDOCRINE: Denies polyuria, nocturia, thyroid problems. HEMATOLOGIC/LYMPHATICS:  Has acute anemia probably from blood loss.  No easy bruising, bleeding.  INTEGUMENTARY: No acne, rash, lesions.  MUSCULOSKELETAL: No joint pain.   BACK:  Complaining of neck pain which is chronic.  Denies gout.   NEUROLOGIC:  Denies any vertigo, ataxia, dementia. PSYCHIATRIC: No ADD or OCD.  HOME MEDICATIONS:  methocarbamol 750 mg half tablet p.o. once daily, Colace 100 mg 1 capsule p.o. b.i.d., acetaminophen/hydrocodone 325/10 half tablet to 1 tablet p.o. every 6 hours as needed for pain.  PHYSICAL EXAMINATION: VITAL SIGNS:  Temperature 98.1, pulse 103, respirations 20, blood pressure 147/90, pulse oximetry 100% on room air.   GENERAL APPEARANCE: Not in acute distress. Moderately built and nourished.  HEENT: Normocephalic, atraumatic. Pupils are equally reacting to light and accommodation. No scleral icterus. No conjunctival injection. No sinus tenderness. No postnasal drip. Moist mucous membranes.  NECK: Supple. No JVD. No thyromegaly. Range of motion is intact.  LUNGS: Clear to auscultation bilaterally. No accessory muscle use and no anterior chest wall tenderness on palpation.  CARDIAC: S1 and S2 normal. Regular rate and rhythm. Positive ESM.   GASTROINTESTINAL: Soft. Bowel sounds are positive in all 4 quadrants. Nontender, nondistended. No hepatosplenomegaly. No masses felt.  NEUROLOGIC: Awake, alert and oriented x 3. Cranial nerves II through XII are grossly intact. Motor and sensory are intact. Reflexes are 2+.  EXTREMITIES: No edema.  No cyanosis. No clubbing.  SKIN: Warm to touch. Normal turgor. No rashes. No lesions.  MUSCULOSKELETAL: No joint effusion, tenderness, erythema.  PSYCHIATRIC: Normal mood and affect.    LABORATORY DATA AND IMAGING STUDIES: Accu-Chek 106, glucose 107, BUN 6.  The rest of the BMP is normal. Troponin less than 0.02. WBC 5.0, hemoglobin 6.9, hematocrit 24.4, platelet count 309,000. MCV is 70. He is AB positive, antibody positive.  Urinalysis:  Straw-colored, clear in appearance; nitrites and leukocyte esterase are negative. Protein is negative, glucose negative. [name] 12-lead EKG: Normal sinus rhythm at 82 beats per minute. No acute ST-T wave changes.   ASSESSMENT AND PLAN: [name] 54 year old Caucasian male with past medical history of internal hemorrhoids and history of lower gastrointestinal bleed in the past, status post colonoscopy is presenting to the Emergency Department with [name] chief complaint of rectal bleed. The patient is also complaining of dizziness and weakness. The patient reports that intermittently he bleeds whenever he bears down, but lately it is worse. Hemoglobin is at 6.9.  Emergency Room physician has ordered 1 unit of blood transfusion and 2 more units of crossmatched.   1. Acute lower gastrointestinal bleed, probably from internal hemorrhoids.  Admit him to  telemetry. We will put him on clear liquid diet, provide Protonix 40 mg IV q. 12 hours.  Transfuse 1 unit of blood.  Check hemoglobin and hematocrit every 6 hours more blood transfusion will be provided if needed. Gastroenterology consult is placed. Currently, the patient is not complaining of any abdominal pain. We will provide pain management as needed basis.  2. Near syncope, probably from acute blood loss anemia. We will provide intravenous fluids and [name] blood transfusion and monitor closely and no further syncope workup is needed at this time.   3. Acute blood loss anemia from lower gastrointestinal bleed.  Monitor hemoglobin and hematocrits closely and provide blood transfusion.  4. Chronic neck pain.  The patient is on Percocet on an as-needed basis. We will continue the same.  5. History of rectal prolapse status  post surgery. No complaints at this time.   6. He is full code.  Brother is the medical power of attorney.  7. Gastrointestinal prophylaxis with Protonix 40 mg IV q. 12 hours.  8. Deep vein thrombosis prophylaxis with sequential compression devices.    Plan of care discussed with the patient. He is aware of the plan.   TOTAL TIME SPENT: 50 minutes.    ____________________________ Ramonita Lab, MD ag:by D: 01/22/2014 19:54:07 ET T: 01/22/2014 20:56:34 ET JOB#: [id number]  cc: Ramonita Lab, MD, <Dictator> Ramonita Lab MD ELECTRONICALLY SIGNED 01/26/2014 18:54

## 2014-05-06 NOTE — Consult Note (Signed)
Pt had large hemorrhoids with irritation on the mucosa, no other bleeding site in the entire colon. Can go home on suppositories and possible stool softeners.  May wish to have surgery for this, if so please get appt to see a surgeon or consult one in the hospital.  Electronic Signatures: Scot JunElliott, Robert T (MD)  (Signed on 21-Jan-16 15:09)  Authored  Last Updated: 21-Jan-16 15:09 by Scot JunElliott, Robert T (MD)

## 2014-05-06 NOTE — Consult Note (Signed)
PATIENT NAME:  Shane [name], Shane [name] MR#:  [id number] DATE OF BIRTH:  27-May-1960  DATE OF CONSULTATION:  01/23/2014  REFERRING PHYSICIAN:  Ramonita Lab, MD  CONSULTING PHYSICIAN:  Ranae Plumber. Arvilla Market, ANP (Adult Nurse Practitioner), Dr. Scot Jun, MD    REASON FOR CONSULTATION: GI bleed.   HISTORY OF PRESENT ILLNESS: This 54 year old Caucasian male reports a history of rectal prolapse requiring surgery in the 1990s. He says he has had internal hemorrhoids that bleed periodically since 2005. He says he will see squirting fresh blood when he bears down. This may or may not be associated with a bowel movement. He does not utilize Anusol suppositories. Stool habits are normal, formed, every day, sometimes he has gas.  He reports in 2013, his hemoglobin was 13.5, but on admission yesterday his hemoglobin was 6.9. He came in because he started to feel weak and dizzy. He denies nausea, vomiting, hematemesis  or acute lower GI bleed. He says he has had good appetite, but he has had some unspecified weight loss from about 12 pounds. The patient denies prior history of colonoscopy and did have a remote flexible sigmoidoscopy when he was incarcerated.   PAST MEDICAL HISTORY: 1.  Internal hemorrhoids.  2.  Hemorrhoidal bleed in the past.  3.  Chronic neck pain.  4.  Rectal prolapse.  5.  Patient has cervical back pain and is anticipating surgery once he has medical clearance. He is followed by pain clinic.   PAST SURGICAL HISTORY:  Rectal prolapse surgery.   ALLERGIES: To amoxicillin and codeine.   PSYCHOSOCIAL:  1.  The patient lives alone.  2.  Divorced, no children.  3.  No history of tobacco.  4.  Reports occasional alcohol.  5.  Denies illicit drug use.  6.  Multiple tattoos, none are new.  7.  Incarcerated in 1997.   FAMILY HISTORY: Mother deceased with some type of intestinal cancer at age 56; father deceased at 37, with alcoholic complications.   HOME MEDICATIONS:   1.  Methocarbamol 750 mg  half tablet once daily.  2.  Colace 100 mg 1 tablet twice daily.  3.  Acetaminophen/hydrocodone 325/10 half tablet to 1 tablet every 6 hours as needed for chronic back pain.   REVIEW OF SYSTEMS: Ten systems are reviewed, positive as noted in the history of present illness, remaining systems otherwise negative.   PHYSICAL EXAMINATION: VITAL SIGNS: Temperature 97.9, pulse 90, respirations 18, blood pressure 111/65, pulse oximetry on room air is 100%.  GENERAL: Well-appearing male sitting at the side of bed eating a regular dinner meal. He is outgoing in personality.  HEENT: Shows head is normocephalic, conjunctivae are pink, sclerae are anicteric.  NECK: Supple, trachea midline.  CARDIAC: S1, S2, regular rhythm.  LUNGS: CTA, respirations are nonlabored.  GASTROINTESTINAL: Abdomen is soft, flat, he has a little umbilical firmness secondary to postsurgical changes.  RECTAL: Deferred.  SKIN: Warm and dry without rash or edema. Multiple tattoos noted.  NEUROLOGIC: He is alert and oriented. Cranial nerves II through XII grossly intact. Motor and sensory appear intact.  MUSCULOSKELETAL: No joint effusion or tenderness or erythema.  PSYCHIATRIC: Affect and mood within normal. The patient states that he wants nothing done to him that he does not have an input in.   DIAGNOSTIC DATA: Admission blood work notable for BUN of 6, creatinine 0.90; electrolytes unremarkable; troponin negative, WBC 5.0, hemoglobin 6.9, hematocrit 24.4; hemoglobin up 7.9, after 1 unit, he has had 2 units of blood; hemoglobin 9.0  today, hematocrit 30.9, MCV on admission 70, MCH 19.6, consistent with chronic history; urinalysis unremarkable, no radiological studies to report.   IMPRESSION: The patient presents with bright red blood per rectum over several decade history. He reports internal hemorrhoids in the distant past with rectal prolapse surgery. He described his rectal bleeding as fresh blood dripping into the toilet even with  bearing down without passage of stool, likely etiology of hemorrhoids. The patient acknowledges that he has never had a full colonoscopy. Etiology for rectal bleeding can include colorectal neoplasm, polyps, internal hemorrhoids. He does report some weight loss. Unsure if the patient has had history of rectal trauma. FH of likely colon cancer in mother noted.   PLAN:  Recommend colonoscopy after patient's hemoglobin. Recommend colonoscopy and since the patient's hemoglobin is now about 8, Dr. Mechele CollinElliott is to decide on timing. The patient denies upper gastrointestinal complaints. No history of EGD. Further gastroenterology recommendations pending.   Thanks for this consultation.   These services provided by Theadore NanKimberly A Avrianna Smart, MS, APRN, [facility], ANP (Adult Nurse Practitioner) under collaborative agreement with Scot Junobert T Elliott, MD.    ____________________________ Ranae PlumberKimberly A. Arvilla MarketMills, ANP (Adult Nurse Practitioner) kam:nt D: 01/23/2014 17:43:13 ET T: 01/23/2014 18:24:31 ET JOB#: [id number]  cc: Cala BradfordKimberly A. Arvilla MarketMills, ANP (Adult Nurse Practitioner), <Dictator> Ranae PlumberKimberly A. Suzette BattiestMills RN, MSN, ANP-BC Adult Nurse Practitioner ELECTRONICALLY SIGNED 01/24/2014 8:35

## 2014-05-08 ENCOUNTER — Ambulatory Visit (INDEPENDENT_AMBULATORY_CARE_PROVIDER_SITE_OTHER): Payer: Medicaid Other | Admitting: General Surgery

## 2014-05-08 ENCOUNTER — Encounter: Payer: Self-pay | Admitting: General Surgery

## 2014-05-08 VITALS — BP 130/72 | HR 74 | Resp 12 | Ht 69.0 in | Wt 149.0 lb

## 2014-05-08 DIAGNOSIS — L989 Disorder of the skin and subcutaneous tissue, unspecified: Secondary | ICD-10-CM

## 2014-05-08 DIAGNOSIS — K625 Hemorrhage of anus and rectum: Secondary | ICD-10-CM | POA: Diagnosis not present

## 2014-05-08 DIAGNOSIS — K649 Unspecified hemorrhoids: Secondary | ICD-10-CM | POA: Diagnosis not present

## 2014-05-08 NOTE — Progress Notes (Signed)
Patient ID: Shane [name], male   DOB: 07/15/1960, 54 y.o.   MRN: [id number]  Chief Complaint  Patient presents with  . Follow-up    hemorrhiods    HPI Shane [name] is a 54 y.o. male here today following up with hemorrhoids. He states he is still bleeding in the bowel and on the paper. He states he is using stool softer and they are not helping as they had at the time of his last visit. The patient reports that the spine physician will not operate until his rectal bleeding is resolved.  HPI  Past Medical History  Diagnosis Date  . Encounter for blood transfusion 2005, 02/2014    Past Surgical History  Procedure Laterality Date  . Colonoscopy  2/16    Dr. Mechele CollinElliott  . Prolapsed rectum surgery  04/30/1995    [facility], trans abdominal mesh repair per patient report    No family history on file.  Social History History  Substance Use Topics  . Smoking status: Former Smoker -- 20 years    Quit date: 01/05/2005  . Smokeless tobacco: Current User  . Alcohol Use: 0.0 oz/week    0 Standard drinks or equivalent per week    Allergies  Allergen Reactions  . Codeine     Stomach pain     Current Outpatient Prescriptions  Medication Sig Dispense Refill  . docusate sodium (COLACE) 100 MG capsule Take 250 mg by mouth 2 (two) times daily.     . Ferrous Sulfate (IRON) 325 (65 FE) MG TABS Take 1 tablet by mouth daily.    . fluticasone (FLONASE) 50 MCG/ACT nasal spray     . HYDROcodone-acetaminophen (NORCO) 10-325 MG per tablet Take 1 tablet by mouth every 6 (six) hours.  0  . methocarbamol (ROBAXIN) 750 MG tablet     . omeprazole (PRILOSEC) 20 MG capsule      No current facility-administered medications for this visit.    Review of Systems Review of Systems  Constitutional: Negative.   Respiratory: Negative.   Cardiovascular: Negative.   Gastrointestinal: Positive for anal bleeding. Negative for nausea, vomiting, abdominal pain, diarrhea, constipation, blood in  stool, abdominal distention and rectal pain.    Blood pressure 130/72, pulse 74, resp. rate 12, height 5\' 9"  (1.753 m), weight 149 lb (67.586 kg).  Physical Exam Physical Exam  Constitutional: He is oriented to person, place, and time. He appears well-developed and well-nourished.  Genitourinary:     Neurological: He is alert and oriented to person, place, and time.  Skin: Skin is warm and dry.    Data Reviewed The patient reports a recently completed hemoglobin at his primary care office was 14. This result will be requested.  Assessment    Internal and external hemorrhoids, symptomatic with bleeding.  Skin nodule, possible melanoma.    Plan    We'll arrange for hemorrhoidectomy and excision of the suspicious skin nodule as an outpatient in the near future.  The patient had questions about whether he needed to undergo general anesthesia. This could be completed under local anesthesia with sedation if desired. He will discuss this with the anesthesia staff.  The risk of bleeding and incontinence and ongoing pain were reviewed.      PCP:  Darliss RidgelEason, Ernest Bridgette Wolden W 05/09/2014, 9:20 PM

## 2014-05-09 DIAGNOSIS — L989 Disorder of the skin and subcutaneous tissue, unspecified: Secondary | ICD-10-CM | POA: Insufficient documentation

## 2014-05-09 DIAGNOSIS — K649 Unspecified hemorrhoids: Secondary | ICD-10-CM | POA: Insufficient documentation

## 2014-05-10 ENCOUNTER — Other Ambulatory Visit: Payer: Self-pay | Admitting: General Surgery

## 2014-05-10 DIAGNOSIS — Z9889 Other specified postprocedural states: Secondary | ICD-10-CM

## 2014-05-10 MED ORDER — ERTAPENEM SODIUM 1 G IJ SOLR: 1.0000 g | Freq: Once | INTRAMUSCULAR | Status: AC

## 2014-05-10 NOTE — H&P (Signed)
HPI Shane [name] is a 54 y.o. male here today following up with hemorrhoids. He states he is still bleeding in the bowel and on the paper. He states he is using stool softer and they are not helping as they had at the time of his last visit. The patient reports that the spine physician will not operate until his rectal bleeding is resolved. HPI  Past Medical History  Diagnosis Date  . Encounter for blood transfusion 2005, 02/2014    Past Surgical History  Procedure Laterality Date  . Colonoscopy  2/16    Dr. Mechele CollinElliott  . Prolapsed rectum surgery  04/30/1995    [facility], trans abdominal mesh repair per patient report    No family history on file.  Social History History  Substance Use Topics  . Smoking status: Former Smoker -- 20 years    Quit date: 01/05/2005  . Smokeless tobacco: Current User  . Alcohol Use: 0.0 oz/week    0 Standard drinks or equivalent per week    Allergies  Allergen Reactions  . Codeine     Stomach pain     Current Outpatient Prescriptions  Medication Sig Dispense Refill  . docusate sodium (COLACE) 100 MG capsule Take 250 mg by mouth 2 (two) times daily.     . Ferrous Sulfate (IRON) 325 (65 FE) MG TABS Take 1 tablet by mouth daily.    . fluticasone (FLONASE) 50 MCG/ACT nasal spray     . HYDROcodone-acetaminophen (NORCO) 10-325 MG per tablet Take 1 tablet by mouth every 6 (six) hours.  0  . methocarbamol (ROBAXIN) 750 MG tablet     . omeprazole (PRILOSEC) 20 MG capsule      No current facility-administered medications for this visit.    Review of Systems Review of Systems  Constitutional: Negative.  Respiratory: Negative.  Cardiovascular: Negative.  Gastrointestinal: Positive for anal bleeding. Negative for nausea, vomiting, abdominal pain, diarrhea, constipation, blood in stool, abdominal distention and rectal pain.    Blood  pressure 130/72, pulse 74, resp. rate 12, height 5\' 9"  (1.753 m), weight 149 lb (67.586 kg).  Physical Exam Physical Exam  Constitutional: He is oriented to person, place, and time. He appears well-developed and well-nourished.  Genitourinary:     Neurological: He is alert and oriented to person, place, and time.  Skin: Skin is warm and dry.    Data Reviewed The patient reports a recently completed hemoglobin at his primary care office was 14. This result will be requested.  Assessment    Internal and external hemorrhoids, symptomatic with bleeding.  Skin nodule, possible melanoma.    Plan    We'll arrange for hemorrhoidectomy and excision of the suspicious skin nodule as an outpatient in the near future.  The patient had questions about whether he needed to undergo general anesthesia. This could be completed under local anesthesia with sedation if desired. He will discuss this with the anesthesia staff.  The risk of bleeding and incontinence and ongoing pain were reviewed.

## 2014-05-11 ENCOUNTER — Telehealth: Payer: Self-pay

## 2014-05-11 NOTE — Telephone Encounter (Signed)
Patient is scheduled for surgery at [facility] on 05/21/14. He will pre admit at the hospital on 05/14/14 at 10:15 am. Patient is aware of dates, time, and instructions.

## 2014-05-12 ENCOUNTER — Emergency Department
Admission: EM | Admit: 2014-05-12 | Discharge: 2014-05-13 | Discharge status: Against Medical Advice | Payer: Medicaid Other | Attending: Emergency Medicine | Admitting: Emergency Medicine

## 2014-05-12 DIAGNOSIS — R42 Dizziness and giddiness: Secondary | ICD-10-CM | POA: Insufficient documentation

## 2014-05-12 DIAGNOSIS — R079 Chest pain, unspecified: Secondary | ICD-10-CM | POA: Diagnosis not present

## 2014-05-12 LAB — CBC
HCT: 42.6 % (ref 40.0–52.0)
Hemoglobin: 14 g/dL (ref 13.0–18.0)
MCH: 30.7 pg (ref 26.0–34.0)
MCHC: 33 g/dL (ref 32.0–36.0)
MCV: 93.1 fL (ref 80.0–100.0)
Platelets: 238 10*3/uL (ref 150–440)
RBC: 4.57 MIL/uL (ref 4.40–5.90)
RDW: 15.1 % — ABNORMAL HIGH (ref 11.5–14.5)
WBC: 7.6 10*3/uL (ref 3.8–10.6)

## 2014-05-12 LAB — COMPREHENSIVE METABOLIC PANEL
ALT: 30 U/L (ref 17–63)
AST: 30 U/L (ref 15–41)
Albumin: 4.1 g/dL (ref 3.5–5.0)
Alkaline Phosphatase: 118 U/L (ref 38–126)
Anion gap: 7 (ref 5–15)
BUN: 13 mg/dL (ref 6–20)
CO2: 31 mmol/L (ref 22–32)
Calcium: 9.2 mg/dL (ref 8.9–10.3)
Chloride: 103 mmol/L (ref 101–111)
Creatinine, Ser: 0.82 mg/dL (ref 0.61–1.24)
GFR calc Af Amer: >60 mL/min (ref >60)
GFR calc non Af Amer: >60 mL/min (ref >60)
Glucose, Bld: 106 mg/dL — ABNORMAL HIGH (ref 65–99)
Potassium: 4.3 mmol/L (ref 3.5–5.1)
Sodium: 141 mmol/L (ref 135–145)
Total Bilirubin: 0.5 mg/dL (ref 0.3–1.2)
Total Protein: 7.3 g/dL (ref 6.5–8.1)

## 2014-05-12 LAB — URINALYSIS COMPLETE WITH MICROSCOPIC (ARMC ONLY)
Bacteria, UA: NONE SEEN
Bilirubin Urine: NEGATIVE
Glucose, UA: NEGATIVE mg/dL
Hgb urine dipstick: NEGATIVE
Ketones, ur: NEGATIVE mg/dL
Leukocytes, UA: NEGATIVE
Nitrite: NEGATIVE
Protein, ur: NEGATIVE mg/dL
RBC / HPF: NONE SEEN RBC/hpf (ref 0–5)
Specific Gravity, Urine: 1.003 — ABNORMAL LOW (ref 1.005–1.030)
Squamous Epithelial / HPF: NONE SEEN
WBC, UA: NONE SEEN WBC/hpf (ref 0–5)
pH: 7 (ref 5.0–8.0)

## 2014-05-12 LAB — TROPONIN I

## 2014-05-12 NOTE — ED Notes (Signed)
Pt states hx of anemia, reports dizziness today. Pt ambulatory with steady gait. States dizziness for 2 weeks.

## 2014-05-13 ENCOUNTER — Encounter: Payer: Self-pay | Admitting: Emergency Medicine

## 2014-05-13 ENCOUNTER — Other Ambulatory Visit: Payer: Self-pay

## 2014-05-13 ENCOUNTER — Emergency Department
Admission: EM | Admit: 2014-05-13 | Discharge: 2014-05-13 | Disposition: A | Discharge status: Home / Self Care | Payer: Medicaid Other | Attending: Emergency Medicine | Admitting: Emergency Medicine

## 2014-05-13 DIAGNOSIS — R42 Dizziness and giddiness: Secondary | ICD-10-CM | POA: Diagnosis not present

## 2014-05-13 DIAGNOSIS — Z87891 Personal history of nicotine dependence: Secondary | ICD-10-CM | POA: Diagnosis not present

## 2014-05-13 DIAGNOSIS — Z79899 Other long term (current) drug therapy: Secondary | ICD-10-CM | POA: Diagnosis not present

## 2014-05-13 DIAGNOSIS — R079 Chest pain, unspecified: Secondary | ICD-10-CM | POA: Insufficient documentation

## 2014-05-13 DIAGNOSIS — R002 Palpitations: Secondary | ICD-10-CM | POA: Diagnosis present

## 2014-05-13 DIAGNOSIS — Z7951 Long term (current) use of inhaled steroids: Secondary | ICD-10-CM | POA: Insufficient documentation

## 2014-05-13 DIAGNOSIS — Z880 Allergy status to penicillin: Secondary | ICD-10-CM | POA: Diagnosis not present

## 2014-05-13 HISTORY — DX: Other cervical disc displacement, unspecified cervical region: M50.20

## 2014-05-13 HISTORY — DX: Unspecified hemorrhoids: K64.9

## 2014-05-13 LAB — CBC
HEMATOCRIT: 43 % (ref 40.0–52.0)
HEMOGLOBIN: 14.1 g/dL (ref 13.0–18.0)
MCH: 30.4 pg (ref 26.0–34.0)
MCHC: 32.8 g/dL (ref 32.0–36.0)
MCV: 92.6 fL (ref 80.0–100.0)
Platelets: 229 10*3/uL (ref 150–440)
RBC: 4.64 MIL/uL (ref 4.40–5.90)
RDW: 14.7 % — AB (ref 11.5–14.5)
WBC: 6.1 10*3/uL (ref 3.8–10.6)

## 2014-05-13 LAB — TROPONIN I: Troponin I: <0.03 ng/mL (ref <0.031)

## 2014-05-13 NOTE — ED Notes (Signed)
Pt up to desk, inquiring about wait times. Pt in NAD. RN offered comfort measures.

## 2014-05-13 NOTE — Discharge Instructions (Signed)
Please seek medical attention for any high fevers, chest pain, shortness of breath, change in behavior, persistent vomiting, bloody stool or any other new or concerning symptoms. ° °Chest Pain (Nonspecific) °It is often hard to give a specific diagnosis for the cause of chest pain. There is always a chance that your pain could be related to something serious, such as a heart attack or a blood clot in the lungs. You need to follow up with your health care provider for further evaluation. °CAUSES  °· Heartburn. °· Pneumonia or bronchitis. °· Anxiety or stress. °· Inflammation around your heart (pericarditis) or lung (pleuritis or pleurisy). °· A blood clot in the lung. °· A collapsed lung (pneumothorax). It can develop suddenly on its own (spontaneous pneumothorax) or from trauma to the chest. °· Shingles infection (herpes zoster virus). °The chest wall is composed of bones, muscles, and cartilage. Any of these can be the source of the pain. °· The bones can be bruised by injury. °· The muscles or cartilage can be strained by coughing or overwork. °· The cartilage can be affected by inflammation and become sore (costochondritis). °DIAGNOSIS  °Lab tests or other studies may be needed to find the cause of your pain. Your health care provider may have you take a test called an ambulatory electrocardiogram (ECG). An ECG records your heartbeat patterns over a 24-hour period. You may also have other tests, such as: °· Transthoracic echocardiogram (TTE). During echocardiography, sound waves are used to evaluate how blood flows through your heart. °· Transesophageal echocardiogram (TEE). °· Cardiac monitoring. This allows your health care provider to monitor your heart rate and rhythm in real time. °· Holter monitor. This is a portable device that records your heartbeat and can help diagnose heart arrhythmias. It allows your health care provider to track your heart activity for several days, if needed. °· Stress tests by  exercise or by giving medicine that makes the heart beat faster. °TREATMENT  °· Treatment depends on what may be causing your chest pain. Treatment may include: °¨ Acid blockers for heartburn. °¨ Anti-inflammatory medicine. °¨ Pain medicine for inflammatory conditions. °¨ Antibiotics if an infection is present. °· You may be advised to change lifestyle habits. This includes stopping smoking and avoiding alcohol, caffeine, and chocolate. °· You may be advised to keep your head raised (elevated) when sleeping. This reduces the chance of acid going backward from your stomach into your esophagus. °Most of the time, nonspecific chest pain will improve within 2-3 days with rest and mild pain medicine.  °HOME CARE INSTRUCTIONS  °· If antibiotics were prescribed, take them as directed. Finish them even if you start to feel better. °· For the next few days, avoid physical activities that bring on chest pain. Continue physical activities as directed. °· Do not use any tobacco products, including cigarettes, chewing tobacco, or electronic cigarettes. °· Avoid drinking alcohol. °· Only take medicine as directed by your health care provider. °· Follow your health care provider's suggestions for further testing if your chest pain does not go away. °· Keep any follow-up appointments you made. If you do not go to an appointment, you could develop lasting (chronic) problems with pain. If there is any problem keeping an appointment, call to reschedule. °SEEK MEDICAL CARE IF:  °· Your chest pain does not go away, even after treatment. °· You have a rash with blisters on your chest. °· You have a fever. °SEEK IMMEDIATE MEDICAL CARE IF:  °· You have increased chest   pain or pain that spreads to your arm, neck, jaw, back, or abdomen. °· You have shortness of breath. °· You have an increasing cough, or you cough up blood. °· You have severe back or abdominal pain. °· You feel nauseous or vomit. °· You have severe weakness. °· You  faint. °· You have chills. °This is an emergency. Do not wait to see if the pain will go away. Get medical help at once. Call your local emergency services (911 in [place] Do not drive yourself to the hospital. °MAKE SURE YOU:  °· Understand these instructions. °· Will watch your condition. °· Will get help right away if you are not doing well or get worse. °Document Released: 10/01/2004 Document Revised: 12/27/2012 Document Reviewed: 07/28/2007 °[employer]® Patient Information ©2015 [employer]. This information is not intended to replace advice given to you by your health care provider. Make sure you discuss any questions you have with your health care provider. ° °

## 2014-05-13 NOTE — ED Notes (Signed)
States feels palpitations and face gets splotchy

## 2014-05-13 NOTE — ED Notes (Signed)
Patient reports feeling "heart fluttering", dizziness, and general malaise for last 1-2 weeks. Reports he currently has hemorrhoids that he needs to have surgery for, as well as back/neck surgery. Patient states he was recently seen here for anemia r/t hemorrhoids. Patient states he was here last night, but couldn't wait to see MD. +"numbness" down left arm. +Tingling to lips.

## 2014-05-13 NOTE — ED Provider Notes (Signed)
[facility] Emergency Department Provider Note   ____________________________________________  Time seen: 1815  I have reviewed the triage vital signs and the nursing notes.   HISTORY  Chief Complaint Palpitations and Dizziness   History limited by: Not Limited   HPI Shane [name] is a 54 y.o. male who comes into the emergency department today because of concerns for episodes of brief sharp left sided chest pain that is accompanied by facial flushing and "splotching". These episodes have been going on for weeks now. No obvious eliciting factors. Patient not currently having one of these episodes. Patient did come to the emergency department last night to be evaluated for this but left before being evaluated. He states that these episodes are accompanied with some dizziness.   Past Medical History  Diagnosis Date  . Encounter for blood transfusion 2005, 02/2014  . Hemorrhoid   . Herniated cervical disc     Patient Active Problem List   Diagnosis Date Noted  . Hemorrhoid 05/09/2014  . Skin abnormality 05/09/2014    Past Surgical History  Procedure Laterality Date  . Colonoscopy  2/16    Dr. Mechele CollinElliott  . Prolapsed rectum surgery  04/30/1995    [facility], trans abdominal mesh repair per patient report    Current Outpatient Rx  Name  Route  Sig  Dispense  Refill  . docusate sodium (COLACE) 100 MG capsule   Oral   Take 250 mg by mouth 2 (two) times daily.          . Ferrous Sulfate (IRON) 325 (65 FE) MG TABS   Oral   Take 1 tablet by mouth daily.         . fluticasone (FLONASE) 50 MCG/ACT nasal spray               . HYDROcodone-acetaminophen (NORCO) 10-325 MG per tablet   Oral   Take 1 tablet by mouth every 6 (six) hours.      0   . methocarbamol (ROBAXIN) 750 MG tablet               . omeprazole (PRILOSEC) 20 MG capsule                 Allergies Codeine and Amoxicillin  History reviewed. No pertinent family  history.  Social History History  Substance Use Topics  . Smoking status: Former Smoker -- 20 years    Quit date: 01/05/2005  . Smokeless tobacco: Current User  . Alcohol Use: 0.0 oz/week    0 Standard drinks or equivalent per week    Review of Systems  Constitutional: Negative for fever. Cardiovascular: Positive for chest pain. Respiratory: Negative for shortness of breath. Gastrointestinal: Negative for abdominal pain, vomiting and diarrhea. Genitourinary: Negative for dysuria. Musculoskeletal: Negative for back pain. Skin: Negative for rash. Neurological: Negative for headaches, focal weakness or numbness.   10-point ROS otherwise negative.  ____________________________________________   PHYSICAL EXAM:  VITAL SIGNS: ED Triage Vitals  Enc Vitals Group     BP 05/13/14 1609 129/80 mmHg     Pulse Rate 05/13/14 1609 95     Resp 05/13/14 1609 18     Temp 05/13/14 1609 98.1 F (36.7 C)     Temp Source 05/13/14 1609 Oral     SpO2 05/13/14 1609 99 %     Weight 05/13/14 1609 149 lb (67.586 kg)     Height 05/13/14 1609 5\' 9"  (1.753 m)   Constitutional: Alert and oriented. Well appearing and  in no distress. Eyes: Conjunctivae are normal. PERRL. Normal extraocular movements. ENT   Head: Normocephalic and atraumatic.   Nose: No congestion/rhinnorhea.   Mouth/Throat: Mucous membranes are moist.   Neck: No stridor. Hematological/Lymphatic/Immunilogical: No cervical lymphadenopathy. Cardiovascular: Normal rate, regular rhythm.  No murmurs, rubs, or gallops. Respiratory: Normal respiratory effort without tachypnea nor retractions. Breath sounds are clear and equal bilaterally. No wheezes/rales/rhonchi. Gastrointestinal: Soft and nontender. No distention.  Genitourinary: Deferred Musculoskeletal: Normal range of motion in all extremities. No joint effusions.  No lower extremity tenderness nor edema. Neurologic:  Normal speech and language. No gross focal  neurologic deficits are appreciated. Speech is normal.  Skin:  Skin is warm, dry and intact. No rash noted. Psychiatric: Mood and affect are normal. Speech and behavior are normal. Patient exhibits appropriate insight and judgment.  ____________________________________________    LABS (pertinent positives/negatives)  Labs Reviewed  CBC - Abnormal; Notable for the following:    RDW 14.7 (*)    All other components within normal limits  TROPONIN I     ____________________________________________   EKG  EKG Time: 1606 Rate: 94 Rhythm: Normal sinus rhythm Axis: Normal Intervals: QTc 425 QRS: Normal ST changes: No ST elevation or depression    ____________________________________________    RADIOLOGY  None  ____________________________________________   PROCEDURES  Procedure(s) performed: None  Critical Care performed: No  ____________________________________________   INITIAL IMPRESSION / ASSESSMENT AND PLAN / ED COURSE  Pertinent labs & imaging results that were available during my care of the patient were reviewed by me and considered in my medical decision making (see chart for details).  Patient presents because of intermittent episodes of brief sharp chest pain that is accompanied with facial flushing and some dizziness.  On exam patient appears very well, no concerning physical exam findings.  Blood work without concerning findings, troponin negative.  Discussed and encouraged patient to follow up with primary care doctor for further evaluation for the symptoms.  In my judgment, in view of the above findings, the patient has a reassuring evaluation and can be safely discharged.Issues concerning treatment and diagnosis were discussed. There were no barriers to understanding. The plan of treatment explanation was well received by the patient and/or family who then verbalized understanding.  ____________________________________________   FINAL  CLINICAL IMPRESSION(S) / ED DIAGNOSES  Final diagnoses:  Chest pain, unspecified chest pain type     Shane SemenGraydon Caulder Wehner, MD 05/13/14 [phone]

## 2014-05-14 ENCOUNTER — Inpatient Hospital Stay: Admission: RE | Admit: 2014-05-14 | Payer: Medicaid Other | Source: Ambulatory Visit

## 2014-05-15 ENCOUNTER — Encounter
Admission: RE | Admit: 2014-05-15 | Discharge: 2014-05-15 | Disposition: A | Discharge status: Home / Self Care | Payer: Medicaid Other | Source: Ambulatory Visit | Attending: General Surgery | Admitting: General Surgery

## 2014-05-15 ENCOUNTER — Other Ambulatory Visit: Payer: Self-pay

## 2014-05-15 HISTORY — DX: Gastro-esophageal reflux disease without esophagitis: K21.9

## 2014-05-15 HISTORY — DX: Anxiety disorder, unspecified: F41.9

## 2014-05-15 HISTORY — DX: Anemia, unspecified: D64.9

## 2014-05-21 ENCOUNTER — Ambulatory Visit: Admission: RE | Admit: 2014-05-21 | Payer: Medicaid Other | Source: Ambulatory Visit | Admitting: General Surgery

## 2014-05-21 ENCOUNTER — Telehealth: Payer: Self-pay | Admitting: *Deleted

## 2014-05-21 ENCOUNTER — Encounter: Admission: RE | Payer: Self-pay | Source: Ambulatory Visit

## 2014-05-21 SURGERY — HEMORRHOIDECTOMY
Anesthesia: General

## 2014-05-21 MED ORDER — BUPIVACAINE LIPOSOME 1.3 % IJ SUSP
INTRAMUSCULAR | Status: AC
Start: 2014-05-21 — End: 2014-05-21
  Filled 2014-05-21: qty 20

## 2014-05-21 MED ORDER — BUPIVACAINE-EPINEPHRINE (PF) 0.5% -1:200000 IJ SOLN
INTRAMUSCULAR | Status: AC
  Filled 2014-05-21: qty 30

## 2014-05-21 NOTE — Telephone Encounter (Signed)
Message for patient to call the office.  

## 2014-05-21 NOTE — Telephone Encounter (Signed)
Schedule an elective appointment, Not a work in/ urgent appt to review plans.

## 2014-05-21 NOTE — Telephone Encounter (Signed)
Patient has an appointment tomorrow, 05-22-14 at 3:15 pm.

## 2014-05-21 NOTE — Telephone Encounter (Signed)
[name] surgery has been cancelled today.   Dr. Lemar LivingsByrnett to inform the O.R.   This patient is undecided on treatment options at this time.

## 2014-05-22 ENCOUNTER — Ambulatory Visit (INDEPENDENT_AMBULATORY_CARE_PROVIDER_SITE_OTHER): Payer: Medicaid Other | Admitting: General Surgery

## 2014-05-22 ENCOUNTER — Encounter: Payer: Self-pay | Admitting: General Surgery

## 2014-05-22 VITALS — BP 108/58 | HR 84 | Resp 16 | Ht 69.0 in | Wt 150.0 lb

## 2014-05-22 DIAGNOSIS — K649 Unspecified hemorrhoids: Secondary | ICD-10-CM

## 2014-05-22 MED ORDER — HYDROCORTISONE 2.5 % RE CREA: 1.0000 "application " | TOPICAL_CREAM | Freq: Two times a day (BID) | RECTAL | Status: DC

## 2014-05-22 MED ORDER — HYDROCORTISONE ACETATE 25 MG RE SUPP: 25.0000 mg | Freq: Two times a day (BID) | RECTAL | Status: AC

## 2014-05-22 NOTE — Patient Instructions (Signed)
Follow up as scheduled.  

## 2014-05-22 NOTE — Progress Notes (Signed)
Patient ID: Shane [name], male   DOB: 03/21/1960, 54 y.o.   MRN: [id number]  Chief Complaint  Patient presents with  . Rectal Problems    HPI Shane [name] is a 54 y.o. male.  Here today to discuss options for hemorrhoids. His planned hemorrhoid surgery was cancelled yesterday. The patient had found the infrared coagulator video 1 unit tube and questioned whether this would be a more appropriate than surgical excision.    HPI  Past Medical History  Diagnosis Date  . Encounter for blood transfusion 2005, 02/2014  . Hemorrhoid   . Herniated cervical disc   . Anxiety   . GERD (gastroesophageal reflux disease)   . Anemia     Past Surgical History  Procedure Laterality Date  . Colonoscopy  2/16    Dr. Mechele CollinElliott  . Prolapsed rectum surgery  04/30/1995    [facility], trans abdominal mesh repair per patient report  . Septoplasty    . Fracture surgery Left     fractured femur    No family history on file.  Social History History  Substance Use Topics  . Smoking status: Former Smoker -- 20 years    Quit date: 01/05/2005  . Smokeless tobacco: Current User  . Alcohol Use: 0.0 oz/week    0 Standard drinks or equivalent per week    Allergies  Allergen Reactions  . Codeine     Stomach pain   . Amoxicillin Rash    Current Outpatient Prescriptions  Medication Sig Dispense Refill  . docusate sodium (COLACE) 100 MG capsule Take 250 mg by mouth 2 (two) times daily.     . Ferrous Sulfate (IRON) 325 (65 FE) MG TABS Take 1 tablet by mouth daily.    . fluticasone (FLONASE) 50 MCG/ACT nasal spray Place 1 spray into both nostrils daily.     Marland Kitchen. HYDROcodone-acetaminophen (NORCO) 10-325 MG per tablet Take 1 tablet by mouth every 6 (six) hours as needed.   0  . methocarbamol (ROBAXIN) 750 MG tablet Take 750 mg by mouth at bedtime.     Marland Kitchen. omeprazole (PRILOSEC) 20 MG capsule Take 20 mg by mouth daily.     . hydrocortisone (ANUSOL-HC) 25 MG suppository Place 1 suppository (25 mg total)  rectally 2 (two) times daily. 12 suppository 1  . hydrocortisone (PROCTOSOL HC) 2.5 % rectal cream Place 1 application rectally 2 (two) times daily. 30 g 1   Current Facility-Administered Medications  Medication Dose Route Frequency Provider Last Rate Last Dose  . ertapenem (INVANZ) injection 1 g  1 g Intravenous Once Earline MayotteJeffrey W Lamarr Feenstra, MD        Review of Systems Review of Systems  Constitutional: Negative.   Respiratory: Negative.   Cardiovascular: Negative.   Neurological: Positive for light-headedness.    Blood pressure 108/58, pulse 84, resp. rate 16, height 5\' 9"  (1.753 m), weight 150 lb (68.04 kg).  Physical Exam Physical Exam  Constitutional: He is oriented to person, place, and time. He appears well-developed and well-nourished.  Neurological: He is alert and oriented to person, place, and time.  Skin: Skin is warm and dry.       Assessment    Symptomatic internal and sternal hemorrhoids. Patient presently declined surgical intervention.     Plan    The patient brought up seeing a "specialist" and hemorrhoid surgery. This would be a colon Art gallery managerrectal surgeon. None available in town. He might find one at [place], [place] or in [place]. He can speak with his  PCP for referral in this regard. In the interval he expressed an interest in using suppositories. These are not covered under his Medicaid coverage, and for that reason Proctosol cream will be substituted. Follow-up here will be on an as-needed basis.      Proctosol HC 2.5% rectal cream BID  PCP:  Darliss Ridgelason, Ernest  Donalyn Schneeberger W 05/22/2014, 5:09 PM

## 2014-06-08 ENCOUNTER — Emergency Department
Admission: EM | Admit: 2014-06-08 | Discharge: 2014-06-08 | Discharge status: Against Medical Advice | Payer: Medicaid Other | Attending: Emergency Medicine | Admitting: Emergency Medicine

## 2014-06-08 DIAGNOSIS — K922 Gastrointestinal hemorrhage, unspecified: Secondary | ICD-10-CM | POA: Diagnosis not present

## 2014-06-08 DIAGNOSIS — Z87891 Personal history of nicotine dependence: Secondary | ICD-10-CM | POA: Diagnosis not present

## 2014-06-08 DIAGNOSIS — R42 Dizziness and giddiness: Secondary | ICD-10-CM | POA: Insufficient documentation

## 2014-06-08 LAB — CBC
HEMATOCRIT: 42.4 % (ref 40.0–52.0)
Hemoglobin: 14.1 g/dL (ref 13.0–18.0)
MCH: 31.7 pg (ref 26.0–34.0)
MCHC: 33.3 g/dL (ref 32.0–36.0)
MCV: 95.2 fL (ref 80.0–100.0)
PLATELETS: 247 10*3/uL (ref 150–440)
RBC: 4.45 MIL/uL (ref 4.40–5.90)
RDW: 14 % (ref 11.5–14.5)
WBC: 5.9 10*3/uL (ref 3.8–10.6)

## 2014-06-08 LAB — TYPE AND SCREEN
ABO/RH(D): AB POS
ANTIBODY SCREEN: NEGATIVE

## 2014-06-08 LAB — BASIC METABOLIC PANEL
ANION GAP: 5 (ref 5–15)
BUN: 12 mg/dL (ref 6–20)
CALCIUM: 9.2 mg/dL (ref 8.9–10.3)
CHLORIDE: 105 mmol/L (ref 101–111)
CO2: 29 mmol/L (ref 22–32)
Creatinine, Ser: 0.76 mg/dL (ref 0.61–1.24)
Glucose, Bld: 105 mg/dL — ABNORMAL HIGH (ref 65–99)
POTASSIUM: 4.3 mmol/L (ref 3.5–5.1)
SODIUM: 139 mmol/L (ref 135–145)

## 2014-06-08 LAB — ABO/RH: ABO/RH(D): AB POS

## 2014-06-08 NOTE — ED Notes (Signed)
Pt c/o generalilzed dizziness, bright red blood from rectum for the past week..states he has a hx of blood tranfusions

## 2015-10-22 ENCOUNTER — Emergency Department
Admission: EM | Admit: 2015-10-22 | Discharge: 2015-10-22 | Disposition: A | Discharge status: Home / Self Care | Payer: Medicaid Other | Attending: Emergency Medicine | Admitting: Emergency Medicine

## 2015-10-22 DIAGNOSIS — Z79899 Other long term (current) drug therapy: Secondary | ICD-10-CM | POA: Diagnosis not present

## 2015-10-22 DIAGNOSIS — F17200 Nicotine dependence, unspecified, uncomplicated: Secondary | ICD-10-CM | POA: Diagnosis not present

## 2015-10-22 DIAGNOSIS — Z7951 Long term (current) use of inhaled steroids: Secondary | ICD-10-CM | POA: Insufficient documentation

## 2015-10-22 DIAGNOSIS — M4802 Spinal stenosis, cervical region: Secondary | ICD-10-CM

## 2015-10-22 DIAGNOSIS — G8929 Other chronic pain: Secondary | ICD-10-CM | POA: Diagnosis not present

## 2015-10-22 DIAGNOSIS — M542 Cervicalgia: Secondary | ICD-10-CM | POA: Diagnosis present

## 2015-10-22 MED ORDER — HYDROCODONE-ACETAMINOPHEN 10-325 MG PO TABS: 1.0000 | ORAL_TABLET | Freq: Four times a day (QID) | ORAL | 0 refills | Status: DC | PRN

## 2015-10-22 NOTE — ED Notes (Signed)
Pt ambulatory to treatment room. C/o recurrent chronic neck pain that radiates "everywhere'". Pt alert and oriented X4, active, cooperative, pt in NAD. RR even and unlabored, color WNL.

## 2015-10-22 NOTE — ED Notes (Signed)
Pt states understanding of discharge instructions. NAD noted at this time.  

## 2015-10-22 NOTE — Discharge Instructions (Signed)
Your chart, records, and MRI have been reviewed. The [place] Controlled Substances Database has also been reviewed which confirms your prescription practices. Take the provided Norco tabs as directed for pain relief. Follow-up with your primary care provider, spinal surgeon, or your selected pain management specialist for ongoing care. You should consider a pain management provider in [place], [place], or [place]. Please review the Chronic Pain Management Policy of [facility], below.   Emergency care providers appreciate that many patients coming to us are in severe pain and we wish to address their pain in the safest, most responsible manner.  It is important to recognize, however, that the proper treatment of chronic pain differs from that of the pain of injuries and acute illnesses.  Our goal is to provider quality, safe, personalized care and we thank you for giving us the opportunity to serve you.  The use of narcotics and related agents for chronic pain syndromes may lead to additional physical and psychological problems.  Nearly as many people die from prescription narcotics each year as die from car crashes.  Additionally, this risk is increased if such prescriptions are obtained from a variety of sources.  Therefore, only your primary care physician or a pain management specialist is able to safely treat such syndromes with narcotic medications long-term.  Documentation revealing such prescriptions have been sought from multiple sources may prohibit us from providing a refill or different narcotic medication.  Your name may be checked first through the [facility] Controlled Substances Reporting System.  This database is a record of controlled substance medication prescriptions that the patient has received.  This has been established by [facility] in an effort to eliminate the dangerous, and often life-threatening, practice of obtaining multiple prescriptions from  different medical providers.  If you have a chronic pain syndrome (i.e. chronic headaches, recurrent back or neck pain, dental pain, abdominal or pelvic pain without a specific diagnosis, or neuropathic pain such as fibromyalgia) or recurrent visits for the same condition without an acute diagnosis, you may be treated with non-narcotics and other non-addictive medicines.  Allergic reactions or negative side effects that may be reported by a patient to such medications will not typically lead to the use of a narcotic analgesic or other controlled substance as an alternative.  Patients managing chronic pain with a personal physician should have provisions in place for breakthrough pain.  If you are in crisis, you should call your physician.  If your physician directs you to the emergency department, please have the doctor call and speak to our attending physician concerning your care.  When patients come to the Emergency Department (ED) with acute medical conditions in which the ED physician feels it is appropriate to prescribe narcotic or sedating pain medication, the physician will prescribe these in very limited quantities.  The amount of these medications will last only until you can see your primary care physician in his/her office.  Any patient who returns to the ED seeking refills should expect only non-narcotic pain medications.  In the event an acute medical condition exists and the emergency physician feels it is necessary that the patient be given a narcotic or sedating medication, a responsible adult driver should be present in the room prior to the medication being given by the nurse.  Prescriptions for narcotic or sedating medications that have been lost, stolen, or expired will NOT be refilled in the ED.  Patients who have chronic pain may receive non-narcotic prescriptions until seen  by their primary care physician.  It is every patient's personal responsibility to maintain active  prescriptions with his or her primary care physician or specialist.

## 2015-10-22 NOTE — ED Triage Notes (Signed)
Pt c/o neck pain that is radiating "everywhere".. Pt is scheduled for surgery in less then a month.

## 2015-10-25 NOTE — ED Provider Notes (Signed)
[facility] Emergency Department Provider Note ____________________________________________  Time seen: 1535  I have reviewed the triage vital signs and the nursing notes.  HISTORY  Chief Complaint  Neck Pain  HPI Shane [name] is a 55 y.o. male presents to the ED for evaluation and management of his chronic neck pain. The patient presents his health records and MRI images for review. He is forthright and honest about his premature discharge from his pain management provider last month. He was apparently discharged after a profane outburst while on the phone trying to rectify his prior approval for prescriptions. He completed his last oxycodone last week. His is scheduled for cervical ACDF surgery on November 12, 2014 with Dr. Jaynie Collins. His is at this time unable to secure a timely referral to pain management ahead of his surgery. He denies any recent injury, trauma, or accident.   Past Medical History:  Diagnosis Date  . Anemia   . Anxiety   . Encounter for blood transfusion 2005, 02/2014  . GERD (gastroesophageal reflux disease)   . Hemorrhoid   . Herniated cervical disc     Patient Active Problem List   Diagnosis Date Noted  . Hemorrhoid 05/09/2014  . Skin abnormality 05/09/2014    Past Surgical History:  Procedure Laterality Date  . COLONOSCOPY  2/16   Dr. Mechele Collin  . FRACTURE SURGERY Left    fractured femur  . prolapsed rectum surgery  04/30/1995   [facility], trans abdominal mesh repair per patient report  . SEPTOPLASTY      Prior to Admission medications   Medication Sig Start Date End Date Taking? Authorizing Provider  docusate sodium (COLACE) 100 MG capsule Take 250 mg by mouth 2 (two) times daily.     Historical Provider, MD  Ferrous Sulfate (IRON) 325 (65 FE) MG TABS Take 1 tablet by mouth daily.    Historical Provider, MD  fluticasone (FLONASE) 50 MCG/ACT nasal spray Place 1 spray into both nostrils daily.  04/20/14   Historical Provider, MD   HYDROcodone-acetaminophen (NORCO) 10-325 MG per tablet Take 1 tablet by mouth every 6 (six) hours as needed.  03/02/14   Historical Provider, MD  HYDROcodone-acetaminophen (NORCO) 10-325 MG tablet Take 1 tablet by mouth every 6 (six) hours as needed. 10/22/15   Turquoise Esch V Bacon Kongmeng Santoro, PA-C  hydrocortisone (ANUSOL-HC) 25 MG suppository Place 1 suppository (25 mg total) rectally 2 (two) times daily. 05/22/14   Earline Mayotte, MD  hydrocortisone (PROCTOSOL HC) 2.5 % rectal cream Place 1 application rectally 2 (two) times daily. 05/22/14   Earline Mayotte, MD  methocarbamol (ROBAXIN) 750 MG tablet Take 750 mg by mouth at bedtime.  03/02/14   Historical Provider, MD  omeprazole (PRILOSEC) 20 MG capsule Take 20 mg by mouth daily.  04/16/14   Historical Provider, MD    Allergies Codeine and Amoxicillin  No family history on file.  Social History Social History  Substance Use Topics  . Smoking status: Former Smoker    Years: 20.00    Quit date: 01/05/2005  . Smokeless tobacco: Current User  . Alcohol use 0.0 oz/week    Review of Systems  Constitutional: Negative for fever. Cardiovascular: Negative for chest pain. Respiratory: Negative for shortness of breath. Gastrointestinal: Negative for abdominal pain, vomiting and diarrhea. Musculoskeletal: Positive for neck pain. Skin: Negative for rash. Neurological: Negative for headaches, focal weakness or numbness. ____________________________________________  PHYSICAL EXAM:  VITAL SIGNS: ED Triage Vitals  Enc Vitals Group  BP 10/22/15 1457 119/70     Pulse Rate 10/22/15 1457 83     Resp 10/22/15 1457 16     Temp 10/22/15 1457 97.6 F (36.4 C)     Temp Source 10/22/15 1457 Oral     SpO2 10/22/15 1457 97 %     Weight 10/22/15 1458 155 lb (70.3 kg)     Height 10/22/15 1458 5\' 10"  (1.778 m)     Head Circumference --      Peak Flow --      Pain Score 10/22/15 1458 10     Pain Loc --      Pain Edu? --      Excl. in GC? --      Constitutional: Alert and oriented. Well appearing and in no distress. Head: Normocephalic and atraumatic. Respiratory: Normal respiratory effort.  Musculoskeletal: Normal neck ROM. Nontender with normal range of motion in all extremities.  Neurologic:  Normal gait without ataxia. Normal speech and language. No gross focal neurologic deficits are appreciated. Skin:  Skin is warm, dry and intact. No rash noted. Psychiatric: Mood and affect are normal. Patient exhibits appropriate insight and judgment. ____________________________________________  INITIAL IMPRESSION / ASSESSMENT AND PLAN / ED COURSE  Patient is a very talkative and pleasant male on this encounter. I advised him that I appreciate his honesty, openness, and candor. He is further advised about the ED's chronic pain policy. Give the review of the [place] Controlled Substances Database, it appears his prescriptions have been filled as prescribed. He is provided with a short 7-day course of hydrocodone 10-325 mg. He is encouraged to dose the meds sparingly. He will follow-up with his PMD and Dr. Jaynie CollinsLim as scheduled.   Clinical Course   ____________________________________________  FINAL CLINICAL IMPRESSION(S) / ED DIAGNOSES  Final diagnoses:  Chronic cervical pain  Spinal stenosis of cervical region      Lissa HoardJenise V Bacon Demarquez Ciolek, PA-C 10/25/15 1710    Minna AntisKevin Paduchowski, MD 10/28/15 [phone]

## 2015-12-05 ENCOUNTER — Emergency Department
Admission: EM | Admit: 2015-12-05 | Discharge: 2015-12-05 | Disposition: A | Discharge status: Home / Self Care | Payer: Medicaid Other | Attending: Emergency Medicine | Admitting: Emergency Medicine

## 2015-12-05 DIAGNOSIS — F17200 Nicotine dependence, unspecified, uncomplicated: Secondary | ICD-10-CM | POA: Diagnosis not present

## 2015-12-05 DIAGNOSIS — G8918 Other acute postprocedural pain: Secondary | ICD-10-CM | POA: Diagnosis not present

## 2015-12-05 DIAGNOSIS — Z9889 Other specified postprocedural states: Secondary | ICD-10-CM | POA: Diagnosis not present

## 2015-12-05 DIAGNOSIS — Z79899 Other long term (current) drug therapy: Secondary | ICD-10-CM | POA: Diagnosis not present

## 2015-12-05 DIAGNOSIS — M542 Cervicalgia: Secondary | ICD-10-CM | POA: Diagnosis not present

## 2015-12-05 DIAGNOSIS — R42 Dizziness and giddiness: Secondary | ICD-10-CM | POA: Insufficient documentation

## 2015-12-05 HISTORY — DX: Malingerer (conscious simulation): Z76.5

## 2015-12-05 LAB — CBC
HEMATOCRIT: 44.8 % (ref 40.0–52.0)
Hemoglobin: 15.4 g/dL (ref 13.0–18.0)
MCH: 32.7 pg (ref 26.0–34.0)
MCHC: 34.3 g/dL (ref 32.0–36.0)
MCV: 95.5 fL (ref 80.0–100.0)
Platelets: 287 10*3/uL (ref 150–440)
RBC: 4.69 MIL/uL (ref 4.40–5.90)
RDW: 13.9 % (ref 11.5–14.5)
WBC: 7 10*3/uL (ref 3.8–10.6)

## 2015-12-05 LAB — URINALYSIS COMPLETE WITH MICROSCOPIC (ARMC ONLY)
BACTERIA UA: NONE SEEN
BILIRUBIN URINE: NEGATIVE
GLUCOSE, UA: NEGATIVE mg/dL
HGB URINE DIPSTICK: NEGATIVE
Ketones, ur: NEGATIVE mg/dL
Leukocytes, UA: NEGATIVE
NITRITE: NEGATIVE
Protein, ur: NEGATIVE mg/dL
SQUAMOUS EPITHELIAL / LPF: NONE SEEN
Specific Gravity, Urine: 1.002 — ABNORMAL LOW (ref 1.005–1.030)
WBC, UA: NONE SEEN WBC/hpf (ref 0–5)
pH: 7 (ref 5.0–8.0)

## 2015-12-05 LAB — BASIC METABOLIC PANEL
ANION GAP: 7 (ref 5–15)
BUN: 10 mg/dL (ref 6–20)
CO2: 32 mmol/L (ref 22–32)
Calcium: 9.6 mg/dL (ref 8.9–10.3)
Chloride: 102 mmol/L (ref 101–111)
Creatinine, Ser: 0.83 mg/dL (ref 0.61–1.24)
GLUCOSE: 78 mg/dL (ref 65–99)
Potassium: 4.1 mmol/L (ref 3.5–5.1)
SODIUM: 141 mmol/L (ref 135–145)

## 2015-12-05 NOTE — ED Provider Notes (Signed)
[facility] Emergency Department Provider Note  ____________________________________________   First MD Initiated Contact with Patient 12/05/15 1608     (approximate)  I have reviewed the triage vital signs and the nursing notes.   HISTORY  Chief Complaint Dizziness    HPI Shane [name] is a 55 y.o. male with a medical history that includes both a recent anterior approach cervical spinal fusion at [facility] (about 3 weeks ago) and a history of drug-seeking behavior and narcotics dependence who presents with complaints of dizziness.  He states that he has been dizzy for 3 weeks since his surgery.  He also complains that his pain is severe and not getting any better after his surgery.  He also states that he is worried that his hemoglobin is too low because he has a history of hemorrhoids and sometimes he passes blood in his stool and he thinks he maybe anemic and needed a blood transfusion.  Mostly he is worried about the pain in his neck after the surgery.  He is ambulatory without difficulty.  He is wearing a soft neck collar at this time.  He denies any numbness or tingling in his extremities as well as any focal weakness in any of his extremities.  He has not had any episodes of passing out.  He saw his surgeon, Dr. Jaynie CollinsLim, for a postoperative visit couple of weeks ago.  He denies fever/chills, chest pain, shortness of breath, nausea, vomiting, abdominal pain.  He states that the pain in his neck is "all over" and that it is severe and everything makes it worse and rest makes it a little bit better but he is out of the Norco that his  Spine surgeon provided.  He cannot describe exactly how his dizziness feels, he just does that he thinks is hemoglobin is too low.  Past Medical History:  Diagnosis Date  . Anemia   . Anxiety   . Drug-seeking behavior    hx of narcotics dependence, previously discharged from pain management clinic  . Encounter for blood transfusion  2005, 02/2014  . GERD (gastroesophageal reflux disease)   . Hemorrhoid   . Herniated cervical disc     Patient Active Problem List   Diagnosis Date Noted  . Hemorrhoid 05/09/2014  . Skin abnormality 05/09/2014    Past Surgical History:  Procedure Laterality Date  . CERVICAL FUSION    . COLONOSCOPY  2/16   Dr. Mechele CollinElliott  . FRACTURE SURGERY Left    fractured femur  . prolapsed rectum surgery  04/30/1995   [facility], [facility] per patient report  . SEPTOPLASTY      Prior to Admission medications   Medication Sig Start Date End Date Taking? Authorizing Provider  docusate sodium (COLACE) 100 MG capsule Take 250 mg by mouth 2 (two) times daily.     Historical Provider, MD  Ferrous Sulfate (IRON) 325 (65 FE) MG TABS Take 1 tablet by mouth daily.    Historical Provider, MD  fluticasone (FLONASE) 50 MCG/ACT nasal spray Place 1 spray into both nostrils daily.  04/20/14   Historical Provider, MD  HYDROcodone-acetaminophen (NORCO) 10-325 MG per tablet Take 1 tablet by mouth every 6 (six) hours as needed.  03/02/14   Historical Provider, MD  HYDROcodone-acetaminophen (NORCO) 10-325 MG tablet Take 1 tablet by mouth every 6 (six) hours as needed. 10/22/15   Jenise V Bacon Menshew, PA-C  hydrocortisone (ANUSOL-HC) 25 MG suppository Place 1 suppository (25 mg total) rectally 2 (  two) times daily. 05/22/14   Earline MayotteJeffrey W Byrnett, MD  hydrocortisone (PROCTOSOL HC) 2.5 % rectal cream Place 1 application rectally 2 (two) times daily. 05/22/14   Earline MayotteJeffrey W Byrnett, MD  methocarbamol (ROBAXIN) 750 MG tablet Take 750 mg by mouth at bedtime.  03/02/14   Historical Provider, MD  omeprazole (PRILOSEC) 20 MG capsule Take 20 mg by mouth daily.  04/16/14   Historical Provider, MD    Allergies Codeine and Amoxicillin  No family history on file.  Social History Social History  Substance Use Topics  . Smoking status: Former Smoker    Years: 20.00    Quit date: 01/05/2005  . Smokeless tobacco:  Current User  . Alcohol use 0.0 oz/week    Review of Systems Constitutional: No fever/chills Eyes: No visual changes. ENT: No sore throat. Cardiovascular: Denies chest pain. Respiratory: Denies shortness of breath. Gastrointestinal: No abdominal pain.  No nausea, no vomiting.  No diarrhea.  No constipation. Genitourinary: Negative for dysuria. Musculoskeletal: Severe postoperative neck pain Skin: Negative for rash. Neurological: Negative for headaches, focal weakness or numbness.  Generalized dizziness  10-point ROS otherwise negative.  ____________________________________________   PHYSICAL EXAM:  VITAL SIGNS: ED Triage Vitals  Enc Vitals Group     BP 12/05/15 1250 120/79     Pulse Rate 12/05/15 1250 87     Resp 12/05/15 1250 18     Temp 12/05/15 1250 97.9 F (36.6 C)     Temp Source 12/05/15 1250 Oral     SpO2 12/05/15 1250 100 %     Weight 12/05/15 1251 144 lb (65.3 kg)     Height 12/05/15 1251 5\' 9"  (1.753 m)     Head Circumference --      Peak Flow --      Pain Score 12/05/15 1252 9     Pain Loc --      Pain Edu? --      Excl. in GC? --     Constitutional: Alert and oriented. Well appearing and in no acute distress. Eyes: Conjunctivae are normal. PERRL. EOMI. Head: Atraumatic. Nose: No congestion/rhinnorhea. Mouth/Throat: Mucous membranes are moist.  Oropharynx non-erythematous. Wearing a soft neck collar.  I removed it and his surgical wound has completely healed with no fluctuance, induration, or dehiscence or surrounding cellulitis Neck: No stridor.  No meningeal signs.   Cardiovascular: Normal rate, regular rhythm. Good peripheral circulation. Grossly normal heart sounds. Respiratory: Normal respiratory effort.  No retractions. Lungs CTAB. Gastrointestinal: Soft and nontender. No distention.  Musculoskeletal: No lower extremity tenderness nor edema. No gross deformities of extremities. Neurologic:  Normal speech and language. No gross focal neurologic  deficits are appreciated.  Good muscle strength throughout upper and lower extremities Skin:  Skin is warm, dry and intact. No rash noted. Psychiatric: Mood and affect are normal. Speech and behavior are normal.  ____________________________________________   LABS (all labs ordered are listed, but only abnormal results are displayed)  Labs Reviewed  URINALYSIS COMPLETEWITH MICROSCOPIC ([facility] ONLY) - Abnormal; Notable for the following:       Result Value   Color, Urine COLORLESS (*)    APPearance CLEAR (*)    Specific Gravity, Urine 1.002 (*)    All other components within normal limits  BASIC METABOLIC PANEL  CBC   ____________________________________________  EKG  ED ECG REPORT I, Merie Wulf, the attending physician, personally viewed and interpreted this ECG.  Date: 12/05/2015 EKG Time: 13:19 Rate: 83 Rhythm: Sinus rhythm with short PR interval QRS Axis:  normal Intervals: PR interval is  ST/T Wave abnormalities: normal Conduction Disturbances: none Narrative Interpretation: unremarkable  ____________________________________________  RADIOLOGY   No results found.  ____________________________________________   PROCEDURES  Procedure(s) performed:   Procedures   Critical Care performed: No ____________________________________________   INITIAL IMPRESSION / ASSESSMENT AND PLAN / ED COURSE  Pertinent labs & imaging results that were available during my care of the patient were reviewed by me and considered in my medical decision making (see chart for details).  I reviewed the patient's chart prior to seeing him and saw that in his postoperative visit 2 weeks ago he was told that his pain medication would not be refilled.  There is also a telephone note from 2 days ago where he called Dr. Duanne Moron office for a refill and was once again declined and referred back to his primary care doctor or his pain management clinic.  However a prior note from another  emergency department visit mentions that the patient admitted to being discharged from his pain management clinic due to inappropriate behavior.  I addressed these issues and concerns with the patient and explained that his medical screening exam is very reassuring today.  He became somewhat upset and was pressuring me regarding his hemoglobin which I assured him was normal and then started asking for a list of all of his medical records.  I explained to him that the proper channels for this is to go through [facility] medical records and that I cannot simply pronounced copy of his results and give it to him.  He did assure him that his vital signs are within normal limits as are his lab results and that he needs to follow up with his outpatient doctor as well as his spine surgeon.  I gave him the name and number of a general surgeon for him to follow up with if he wishes to proceed with surgical treatment for his hemorrhoids.  I reiterated once again the running pain policy of the [facility] system.  He was displeased and yelling angrily to someone on his cell phone as he left with no sign of acute distress nor of any dizziness nor focal neurological deficits.  ____________________________________________  FINAL CLINICAL IMPRESSION(S) / ED DIAGNOSES  Final diagnoses:  Post-operative pain  Dizziness     MEDICATIONS GIVEN DURING THIS VISIT:  Medications - No data to display   NEW OUTPATIENT MEDICATIONS STARTED DURING THIS VISIT:  Discharge Medication List as of 12/05/2015  4:26 PM      Discharge Medication List as of 12/05/2015  4:26 PM      Discharge Medication List as of 12/05/2015  4:26 PM       Note:  This document was prepared using Dragon voice recognition software and may include unintentional dictation errors.    Loleta Rose, MD 12/05/15 1750

## 2015-12-05 NOTE — Discharge Instructions (Signed)
As we discussed, your workup today was reassuring.  You have normal vitals signs, normal labs (including a hemoglobin of 15.4), and you need to follow up with your surgeon.  As per the [facility] chronic pain policy, we will not prescribe any additional pain medication.  This needs to be handled by your surgeon, primary care doctor, or pain management clinic.

## 2015-12-05 NOTE — ED Triage Notes (Addendum)
Pt c/o dizziness since having cervical fusion 3 weeks ago..Marland Kitchen

## 2016-04-12 ENCOUNTER — Emergency Department
Admission: EM | Admit: 2016-04-12 | Discharge: 2016-04-12 | Disposition: A | Discharge status: Home / Self Care | Payer: Medicaid Other | Attending: Emergency Medicine | Admitting: Emergency Medicine

## 2016-04-12 DIAGNOSIS — F17290 Nicotine dependence, other tobacco product, uncomplicated: Secondary | ICD-10-CM | POA: Insufficient documentation

## 2016-04-12 DIAGNOSIS — Z79899 Other long term (current) drug therapy: Secondary | ICD-10-CM | POA: Diagnosis not present

## 2016-04-12 DIAGNOSIS — K922 Gastrointestinal hemorrhage, unspecified: Secondary | ICD-10-CM | POA: Insufficient documentation

## 2016-04-12 DIAGNOSIS — K649 Unspecified hemorrhoids: Secondary | ICD-10-CM | POA: Diagnosis not present

## 2016-04-12 DIAGNOSIS — M5416 Radiculopathy, lumbar region: Secondary | ICD-10-CM | POA: Diagnosis not present

## 2016-04-12 DIAGNOSIS — R21 Rash and other nonspecific skin eruption: Secondary | ICD-10-CM | POA: Insufficient documentation

## 2016-04-12 DIAGNOSIS — K625 Hemorrhage of anus and rectum: Secondary | ICD-10-CM | POA: Diagnosis present

## 2016-04-12 LAB — CBC
HEMATOCRIT: 44.4 % (ref 40.0–52.0)
HEMOGLOBIN: 15.2 g/dL (ref 13.0–18.0)
MCH: 32.4 pg (ref 26.0–34.0)
MCHC: 34.1 g/dL (ref 32.0–36.0)
MCV: 95.1 fL (ref 80.0–100.0)
Platelets: 243 10*3/uL (ref 150–440)
RBC: 4.67 MIL/uL (ref 4.40–5.90)
RDW: 13.5 % (ref 11.5–14.5)
WBC: 7.9 10*3/uL (ref 3.8–10.6)

## 2016-04-12 LAB — COMPREHENSIVE METABOLIC PANEL
ALBUMIN: 3.9 g/dL (ref 3.5–5.0)
ALT: 25 U/L (ref 17–63)
ANION GAP: 7 (ref 5–15)
AST: 31 U/L (ref 15–41)
Alkaline Phosphatase: 80 U/L (ref 38–126)
BILIRUBIN TOTAL: 1.3 mg/dL — AB (ref 0.3–1.2)
BUN: 11 mg/dL (ref 6–20)
CHLORIDE: 105 mmol/L (ref 101–111)
CO2: 27 mmol/L (ref 22–32)
Calcium: 9.6 mg/dL (ref 8.9–10.3)
Creatinine, Ser: 0.73 mg/dL (ref 0.61–1.24)
GFR calc Af Amer: >60 mL/min (ref >60)
GFR calc non Af Amer: >60 mL/min (ref >60)
GLUCOSE: 101 mg/dL — AB (ref 65–99)
POTASSIUM: 4.2 mmol/L (ref 3.5–5.1)
SODIUM: 139 mmol/L (ref 135–145)
TOTAL PROTEIN: 7.2 g/dL (ref 6.5–8.1)

## 2016-04-12 MED ORDER — HYDROXYZINE HCL 10 MG PO TABS: 10.0000 mg | ORAL_TABLET | Freq: Three times a day (TID) | ORAL | 0 refills | Status: AC | PRN

## 2016-04-12 NOTE — ED Triage Notes (Signed)
Pt presents to ED c/o rectal bleeding x 2-3 days that is bright red. Pt has two internal hemorrhoids in which he has been trying to get them fixed. Pt also admits to a rash that has radiated from his chest to upper extremities , itchy,bright pink and  raised

## 2016-04-12 NOTE — ED Notes (Signed)
Pt verbalized understanding of discharge instructions. NAD at this time. 

## 2016-04-12 NOTE — ED Notes (Signed)
Pt states that he has hx/o internal hemorrhoids. Pt states that he has been having rectal bleeding x 3 days. Pt was supposed to have hemorrhoids banded last year but he missed his appt. Pt states that he has hx/o rectal bleeding and has had to have several blood transfusions in the past.   Took laxative 2 nights ago, pt had BM today and had a little blood. Pt states that he ate lunch and then started having diarrhea and bleeding. Pt states he put toilet paper in his rectum to stop the bleeding.  Pt does not appear to be in any distress at this time. Pt is drinking water during assessment.

## 2016-04-12 NOTE — ED Provider Notes (Signed)
[facility] Emergency Department Provider Note  ____________________________________________   First MD Initiated Contact with Patient 04/12/16 1703     (approximate)  I have reviewed the triage vital signs and the nursing notes.   HISTORY  Chief Complaint Rectal Bleeding and Rash (x5 months radiates from chest to upper extremities, itchy)   HPI Shane [name] is a 56 y.o. male with a history of anxiety as well as cervical spine surgery and hemorrhoids who is presenting to the emergency department today with bleeding, rectally over the past several days. He says that he is a well-known history of hemorrhoids and was having a banding surgery planned several years ago but missed the appointment. He says that he has had brown stool over the past several days but with blood dripping from his rectum during and after the bowel movement. He says that he has taken Colace as well as an additional stool softener and now is having loose stool. Does not report any bleeding when he is not moving his bowels. Also reports a rash with intermittent itching to his chest and upper extremities over the past several months. Denies any new clothing, detergents or soaps. Does not report any difficulty breathing or swelling in the pharynx or mouth. Also with recent cervical fusion. Says that he has also had right lower summary numbness over the past several months which is unchanged. His denying any weakness. No difficulty with walking.  Patient denies taking any blood thinners.  Past Medical History:  Diagnosis Date  . Anemia   . Anxiety   . Drug-seeking behavior    hx of narcotics dependence, previously discharged from pain management clinic  . Encounter for blood transfusion 2005, 02/2014  . GERD (gastroesophageal reflux disease)   . Hemorrhoid   . Herniated cervical disc     Patient Active Problem List   Diagnosis Date Noted  . Hemorrhoid 05/09/2014  . Skin abnormality  05/09/2014    Past Surgical History:  Procedure Laterality Date  . CERVICAL FUSION    . COLONOSCOPY  2/16   Dr. Mechele Collin  . FRACTURE SURGERY Left    fractured femur  . prolapsed rectum surgery  04/30/1995   [facility], trans abdominal mesh repair per patient report  . SEPTOPLASTY      Prior to Admission medications   Medication Sig Start Date End Date Taking? Authorizing Provider  docusate sodium (COLACE) 100 MG capsule Take 250 mg by mouth 2 (two) times daily.     Historical Provider, MD  Ferrous Sulfate (IRON) 325 (65 FE) MG TABS Take 1 tablet by mouth daily.    Historical Provider, MD  fluticasone (FLONASE) 50 MCG/ACT nasal spray Place 1 spray into both nostrils daily.  04/20/14   Historical Provider, MD  HYDROcodone-acetaminophen (NORCO) 10-325 MG per tablet Take 1 tablet by mouth every 6 (six) hours as needed.  03/02/14   Historical Provider, MD  HYDROcodone-acetaminophen (NORCO) 10-325 MG tablet Take 1 tablet by mouth every 6 (six) hours as needed. 10/22/15   Jenise V Bacon Menshew, PA-C  hydrocortisone (ANUSOL-HC) 25 MG suppository Place 1 suppository (25 mg total) rectally 2 (two) times daily. 05/22/14   Earline Mayotte, MD  hydrocortisone (PROCTOSOL HC) 2.5 % rectal cream Place 1 application rectally 2 (two) times daily. 05/22/14   Earline Mayotte, MD  methocarbamol (ROBAXIN) 750 MG tablet Take 750 mg by mouth at bedtime.  03/02/14   Historical Provider, MD  omeprazole (PRILOSEC) 20 MG capsule Take 20  mg by mouth daily.  04/16/14   Historical Provider, MD    Allergies Codeine and Amoxicillin  No family history on file.  Social History Social History  Substance Use Topics  . Smoking status: Former Smoker    Years: 20.00    Quit date: 01/05/2005  . Smokeless tobacco: Current User  . Alcohol use 0.0 oz/week    Review of Systems Constitutional: No fever/chills Eyes: No visual changes. ENT: No sore throat. Cardiovascular: Denies chest pain. Respiratory: Denies  shortness of breath. Gastrointestinal: No abdominal pain.  No nausea, no vomiting.  No constipation. Genitourinary: Negative for dysuria. Musculoskeletal: Negative for back pain. Skin: Negative for rash. Neurological: Negative for headaches, focal weakness  10-point ROS otherwise negative.  ____________________________________________   PHYSICAL EXAM:  VITAL SIGNS: ED Triage Vitals  Enc Vitals Group     BP 04/12/16 1544 112/73     Pulse Rate 04/12/16 1544 79     Resp 04/12/16 1544 18     Temp 04/12/16 1544 98.3 F (36.8 C)     Temp Source 04/12/16 1544 Oral     SpO2 04/12/16 1544 99 %     Weight 04/12/16 1545 155 lb (70.3 kg)     Height 04/12/16 1545  (1.778 m)     Head Circumference --      Peak Flow --      Pain Score 04/12/16 1546 8     Pain Loc --      Pain Edu? --      Excl. in GC? --     Constitutional: Alert and oriented. Well appearing and in no acute distress. Eyes: Conjunctivae are normal. PERRL. EOMI. Head: Atraumatic. Nose: No congestion/rhinnorhea. Mouth/Throat: Mucous membranes are moist.  Neck: No stridor.   Cardiovascular: Normal rate, regular rhythm. Grossly normal heart sounds.   Respiratory: Normal respiratory effort.  No retractions. Lungs CTAB. Gastrointestinal: Soft and nontender. No distention.No external hemorrhoids visualized. Rectal exam without any stool on the glove. Heme negative. Musculoskeletal: No lower extremity tenderness nor edema.  No joint effusions. Neurologic:  Normal speech and language. No gross focal neurologic deficits are appreciated. No gait instability.  Walks unassisted with a normal gait. Skin:  Skin is warm, dry and intact. Pink, blanchable, papular rash over the anterior chest as well as onto the upper extremities, bilaterally. The papules are scattered and not coalescing. There is no induration, pus. Nontender.3 Psychiatric: Mood and affect are normal. Speech and behavior are  normal.  ____________________________________________   LABS (all labs ordered are listed, but only abnormal results are displayed)  Labs Reviewed  COMPREHENSIVE METABOLIC PANEL - Abnormal; Notable for the following:       Result Value   Glucose, Bld 101 (*)    Total Bilirubin 1.3 (*)    All other components within normal limits  CBC  POC OCCULT BLOOD, ED   ____________________________________________  EKG   ____________________________________________  RADIOLOGY   ____________________________________________   PROCEDURES  Procedure(s) performed:   Procedures  Critical Care performed:   ____________________________________________   INITIAL IMPRESSION / ASSESSMENT AND PLAN / ED COURSE  Pertinent labs & imaging results that were available during my care of the patient were reviewed by me and considered in my medical decision making (see chart for details).  Recommended the patient follow up with his known providers for his chronic back pain and numbness. Also given the patient follow up back at [facility] for his banding of his hemorrhoids. We'll prescribe Atarax for the rash. Patient  is understanding of these plans willing to comply. Very reassuring lab work.      ____________________________________________   FINAL CLINICAL IMPRESSION(S) / ED DIAGNOSES  Rash. GI bleeding. Radiculopathy.    NEW MEDICATIONS STARTED DURING THIS VISIT:  New Prescriptions   No medications on file     Note:  This document was prepared using Dragon voice recognition software and may include unintentional dictation errors.    Myrna Blazer, MD 04/12/16 [phone]

## 2016-12-22 ENCOUNTER — Emergency Department
Admission: EM | Admit: 2016-12-22 | Discharge: 2016-12-22 | Disposition: A | Discharge status: Home / Self Care | Payer: Medicaid Other | Attending: Emergency Medicine | Admitting: Emergency Medicine

## 2016-12-22 ENCOUNTER — Encounter: Payer: Self-pay | Admitting: Intensive Care

## 2016-12-22 DIAGNOSIS — M549 Dorsalgia, unspecified: Secondary | ICD-10-CM | POA: Insufficient documentation

## 2016-12-22 DIAGNOSIS — K625 Hemorrhage of anus and rectum: Secondary | ICD-10-CM

## 2016-12-22 DIAGNOSIS — Z87891 Personal history of nicotine dependence: Secondary | ICD-10-CM | POA: Diagnosis not present

## 2016-12-22 DIAGNOSIS — Z79899 Other long term (current) drug therapy: Secondary | ICD-10-CM | POA: Insufficient documentation

## 2016-12-22 DIAGNOSIS — K649 Unspecified hemorrhoids: Secondary | ICD-10-CM | POA: Diagnosis not present

## 2016-12-22 DIAGNOSIS — G8929 Other chronic pain: Secondary | ICD-10-CM

## 2016-12-22 LAB — CBC
HCT: 44.6 % (ref 40.0–52.0)
Hemoglobin: 14.9 g/dL (ref 13.0–18.0)
MCH: 32.5 pg (ref 26.0–34.0)
MCHC: 33.4 g/dL (ref 32.0–36.0)
MCV: 97.3 fL (ref 80.0–100.0)
PLATELETS: 227 10*3/uL (ref 150–440)
RBC: 4.58 MIL/uL (ref 4.40–5.90)
RDW: 13.8 % (ref 11.5–14.5)
WBC: 6.2 10*3/uL (ref 3.8–10.6)

## 2016-12-22 LAB — COMPREHENSIVE METABOLIC PANEL
ALT: 21 U/L (ref 17–63)
AST: 27 U/L (ref 15–41)
Albumin: 3.9 g/dL (ref 3.5–5.0)
Alkaline Phosphatase: 80 U/L (ref 38–126)
Anion gap: 8 (ref 5–15)
BUN: 9 mg/dL (ref 6–20)
CHLORIDE: 103 mmol/L (ref 101–111)
CO2: 30 mmol/L (ref 22–32)
Calcium: 9.2 mg/dL (ref 8.9–10.3)
Creatinine, Ser: 0.75 mg/dL (ref 0.61–1.24)
Glucose, Bld: 93 mg/dL (ref 65–99)
POTASSIUM: 4.4 mmol/L (ref 3.5–5.1)
SODIUM: 141 mmol/L (ref 135–145)
Total Bilirubin: 0.8 mg/dL (ref 0.3–1.2)
Total Protein: 6.7 g/dL (ref 6.5–8.1)

## 2016-12-22 MED ORDER — HYDROCORTISONE ACE-PRAMOXINE 1-1 % RE FOAM: 1.0000 | Freq: Two times a day (BID) | RECTAL | 0 refills | Status: AC

## 2016-12-22 NOTE — ED Triage Notes (Signed)
C/o dizziness and Patient reports he has been having bloody stools X2 weeks. HX of the same and getting blood transfusions and internal hemorrhoids. Spinal decompression fusion surgery November of 2017 and replaced 3 levels of spine with separates. Has been having pain in spine that shoots numbness down R leg for about two years that has gotten worse recently. Ambulatory to triage. A&O x4. States "I need something for pain" HX spinal cord damage.

## 2016-12-22 NOTE — ED Provider Notes (Signed)
[facility] Emergency Department Provider Note  ____________________________________________   First MD Initiated Contact with Patient 12/22/16 1718     (approximate)  I have reviewed the triage vital signs and the nursing notes.   HISTORY  Chief Complaint Rectal Bleeding    HPI Shane [name] is a 56 y.o. male with a long and well-documented history of chronic back pain status post multiple spine surgeries and drug-seeking behavior and narcotics dependence who is been discharged from multiple prior pain management clinics.  He presents today with 2 complaints, that of rectal bleeding similar to prior with associated occasional lightheadedness and dizziness which she associated with blood loss anemia, as well as for evaluation of his ongoing back pain both in the middle and lower part of his back she has been ongoing for more than a year since his last back surgery.  Rectal bleeding: Describes this as severe and intermittent, always occurring with bowel movements.  He says it is similar to when he was told in the past he has 2 "jellybean sized internal hemorrhoids".  He has an appointment with a "rectal doctor" in [place] but he states that he has transportation issues so he has missed some appointments.  He believes that his hemoglobin is low and that he needs a blood transfusion because this happened at some point years ago.  He is not have any significant pain and mostly has blood on the toilet paper when he wipes although sometimes it can be significant.  It is bright red.  He denies abdominal pain, fever/chills, chest pain, and shortness of breath.  Nothing makes it better and having bowel movements makes it worse.  Back pain: Chronic, present for at least a year, and the patient states that nothing makes it better and movement makes it worse even though he is ambulatory without difficulty.  His surgeon is Dr. Jaynie Collins, and apparently he has had some postoperative  visits, but has not had a visit recently.  He has been discharged from prior pain clinics due to drug-seeking behavior and being aggressive.  He was establish as a new patient at a pain clinic in [place] within the last few months and was started on a nonnarcotic regimen of gabapentin which he says "doesn't do shit".  He wants Vicodin which she says works in the past.  There is nothing new or different about his pain today and he says it is the same as it has been for the year and he has just been "toughing it out".  Frequently has pain that is sharp and stabbing which radiates down his right leg but that is also chronic.  Past Medical History:  Diagnosis Date  . Anemia   . Anxiety   . Drug-seeking behavior    hx of narcotics dependence, previously discharged from pain management clinic  . Encounter for blood transfusion 2005, 02/2014  . GERD (gastroesophageal reflux disease)   . Hemorrhoid   . Herniated cervical disc     Patient Active Problem List   Diagnosis Date Noted  . Hemorrhoid 05/09/2014  . Skin abnormality 05/09/2014    Past Surgical History:  Procedure Laterality Date  . CERVICAL FUSION    . COLONOSCOPY  2/16   Dr. Mechele Collin  . FRACTURE SURGERY Left    fractured femur  . prolapsed rectum surgery  04/30/1995   [facility], trans abdominal mesh repair per patient report  . SEPTOPLASTY      Prior to Admission medications   Medication  Sig Start Date End Date Taking? Authorizing Provider  cyclobenzaprine (FLEXERIL) 10 MG tablet Take 1 tablet by mouth 2 (two) times daily as needed. 09/04/16  Yes [provider]  gabapentin (NEURONTIN) 100 MG capsule Take 1 capsule by mouth 3 (three) times daily. 10/02/16  Yes [provider]  terbinafine (LAMISIL) 250 MG tablet Take 250 mg by mouth daily. 08/11/16  Yes [provider]  docusate sodium (COLACE) 100 MG capsule Take 250 mg by mouth 2 (two) times daily.     [provider]  Ferrous Sulfate  (IRON) 325 (65 FE) MG TABS Take 1 tablet by mouth daily.    [provider]  fluticasone (FLONASE) 50 MCG/ACT nasal spray Place 1 spray into both nostrils daily.  04/20/14   [provider]  HYDROcodone-acetaminophen (NORCO) 10-325 MG per tablet Take 1 tablet by mouth every 6 (six) hours as needed.  03/02/14   [provider]  HYDROcodone-acetaminophen (NORCO) 10-325 MG tablet Take 1 tablet by mouth every 6 (six) hours as needed. 10/22/15   Menshew, Charlesetta IvoryJenise V Bacon, PA-C  hydrocortisone (ANUSOL-HC) 25 MG suppository Place 1 suppository (25 mg total) rectally 2 (two) times daily. 05/22/14   Earline MayotteByrnett, Jeffrey W, MD  hydrocortisone (PROCTOSOL HC) 2.5 % rectal cream Place 1 application rectally 2 (two) times daily. 05/22/14   Earline MayotteByrnett, Jeffrey W, MD  hydrocortisone 2.5 % cream Apply 1 application topically daily. 11/30/16   [provider]  hydrocortisone-pramoxine (PROCTOFOAM HC) rectal foam Place 1 applicator rectally 2 (two) times daily. 12/22/16   Loleta RoseForbach, Sussie Minor, MD  hydrOXYzine (ATARAX/VISTARIL) 10 MG tablet Take 1 tablet (10 mg total) by mouth 3 (three) times daily as needed for itching. 04/12/16   Schaevitz, Myra Rudeavid Matthew, MD  methocarbamol (ROBAXIN) 750 MG tablet Take 750 mg by mouth at bedtime.  03/02/14   [provider]  omeprazole (PRILOSEC) 20 MG capsule Take 20 mg by mouth daily.  04/16/14   [provider]  pantoprazole (PROTONIX) 40 MG tablet Take 40 mg by mouth daily. 11/30/16   [provider]    Allergies Codeine and Amoxicillin  History reviewed. No pertinent family history.  Social History Social History   Tobacco Use  . Smoking status: Former Smoker    Years: 20.00    Last attempt to quit: 01/05/2005    Years since quitting: 11.9  . Smokeless tobacco: Current User  Substance Use Topics  . Alcohol use: Yes    Alcohol/week: 0.0 oz  . Drug use: No    Review of Systems Constitutional: No fever/chills Cardiovascular:  Denies chest pain. Respiratory: Denies shortness of breath. Gastrointestinal: No abdominal pain.  No nausea, no vomiting.  No diarrhea.  No constipation.  Rectal bleeding intermittent for weeks to months Genitourinary: Negative for dysuria. Musculoskeletal: Chronic middle and lower back pain for more than a year Integumentary: Negative for rash. Neurological: Negative for headaches, focal weakness or numbness.   ____________________________________________   PHYSICAL EXAM:  VITAL SIGNS: ED Triage Vitals  Enc Vitals Group     BP 12/22/16 1511 (!) 129/91     Pulse Rate 12/22/16 1511 85     Resp 12/22/16 1511 14     Temp 12/22/16 1511 97.8 F (36.6 C)     Temp Source 12/22/16 1511 Oral     SpO2 12/22/16 1511 99 %     Weight 12/22/16 1511 72.9 kg (160 lb 11.2 oz)     Height 12/22/16 1511 1.753 m (5\' 9" )  Head Circumference --      Peak Flow --      Pain Score 12/22/16 1510 10     Pain Loc --      Pain Edu? --      Excl. in GC? --     Constitutional: Alert and oriented. Well appearing and in no acute distress. Eyes: Conjunctivae are normal.  Head: Atraumatic. Neck: No stridor.  No meningeal signs.   Cardiovascular: Normal rate, regular rhythm. Good peripheral circulation. Grossly normal heart sounds. Respiratory: Normal respiratory effort.  No retractions. Lungs CTAB. Gastrointestinal: Soft and nontender. No distention.  Rectal exam deferred Musculoskeletal: No lower extremity tenderness nor edema. No gross deformities of extremities.  Ambulatory without difficulty Neurologic:  Normal speech and language. No gross focal neurologic deficits are appreciated.  Skin:  Skin is warm, dry and intact. No rash noted. Psychiatric: Mood and affect are normal. Speech and behavior are normal.  ____________________________________________   LABS (all labs ordered are listed, but only abnormal results are displayed)  Labs Reviewed  COMPREHENSIVE METABOLIC PANEL  CBC  POC OCCULT  BLOOD, ED  TYPE AND SCREEN   ____________________________________________  EKG  ED ECG REPORT I, Loleta Rose, the attending physician, personally viewed and interpreted this ECG.  Date: 12/22/2016 EKG Time: 15:18 Rate: 75 Rhythm: normal sinus rhythm QRS Axis: normal Intervals: normal ST/T Wave abnormalities: normal Narrative Interpretation: no evidence of acute ischemia  ____________________________________________  RADIOLOGY   No results found.  ____________________________________________   PROCEDURES  Critical Care performed: No   Procedure(s) performed:   Procedures   ____________________________________________   INITIAL IMPRESSION / ASSESSMENT AND PLAN / ED COURSE  As part of my medical decision making, I reviewed the following data within the electronic MEDICAL RECORD NUMBER Nursing notes reviewed and incorporated, Labs reviewed , Old chart reviewed, Notes from prior ED visits and  Controlled Substance Database    I reviewed the patient's medical record extensively including notes from his pain clinic, prior ED visits, etc.  He has a long-standing history of drug-seeking behavior, including when I saw the patient last which was just over a year ago in the emergency department.  His complaint at that time was similar.  Today as with his last visit he has a normal hemoglobin and once I reassured him several times that his hemoglobin was normal and that he does not need a blood transfusion, he no longer wanted to talk about additional GI workup including rectal exam.  He wanted to focus on his back pain.  I explained to him that I had read the recent note from his pain clinic about gabapentin and about why they did not prescribe narcotics and why I will not prescribe narcotics for chronic pain.  He became agitated but not violent or aggressive.  He tried to convince me that because he was on them last year he should be on them again and that the pain clinic was not  helping him.  I generated the [place] chronic pain policy and explained there was nothing else I could do for him and that he needs to follow-up as planned for repeat MRIs as ordered by Dr. Jaynie Collins and that he needs to go to his pain clinic for additional help.  He began ripping off his arm bands but never acted aggressively towards me or the ED staff.   He was, however, very adamant that he leave immediately and I provided paperwork and gave him my usual customary return precautions.  As  he did his last visit with me he once again ask for pronounce of all of his medical information and I once again referred him to medical records.  Clinical Course as of Dec 23 2043  Tue Dec 22, 2016  1716 I reviewed the patient's prescription history over the last 24 months in the multi-state controlled substances database(s) that includes [place], [place], [place], [place], [place], [place], [place], [place], [place], [place], [place], [place], [place], [place], and [place].  Results were notable for monthly prescriptions for a large quantity of Vicodin last year, but he has had no controlled substances prescribed for what appears to be 13 months.   [CF]    Clinical Course User Index [CF] Loleta RoseForbach, Marv Alfrey, MD    ____________________________________________  FINAL CLINICAL IMPRESSION(S) / ED DIAGNOSES  Final diagnoses:  Rectal bleeding  Chronic bilateral back pain, unspecified back location  Hemorrhoids, unspecified hemorrhoid type     MEDICATIONS GIVEN DURING THIS VISIT:  Medications - No data to display   ED Discharge Orders        Ordered    hydrocortisone-pramoxine (PROCTOFOAM HC) rectal foam  2 times daily     12/22/16 1810       Note:  This document was prepared using Dragon voice recognition software and may include unintentional dictation errors.    Loleta RoseForbach, Luisantonio Adinolfi, MD 12/22/16 2045

## 2016-12-22 NOTE — Discharge Instructions (Signed)
Your blood work was all normal today, including a hemoglobin of 14.9.  Although your hemorrhoids are continuing to cause you trouble, fortunately they are not resulting in acute blood loss anemia.  Please follow up with your rectal/GI doctor at the next available opportunity to discuss both surgical and non-surgical options to help with they symptoms.  You may also try the prescribed foam medication; be sure and ask the pharmacy how much it will cost, because even with Medicaid it may be expensive, but there is no equivalent generic option.  Regarding your back pain, as we discussed, we cannot write a prescription for narcotics for chronic pain.  Please follow up with your pain clinic doctor at the next available opportunity and let her know that your current plan is not working for you.

## 2016-12-22 NOTE — ED Notes (Signed)
Pt called for redraw and is out in the parking lot.

## 2017-02-27 ENCOUNTER — Emergency Department
Admission: EM | Admit: 2017-02-27 | Discharge: 2017-02-27 | Disposition: A | Discharge status: Home / Self Care | Payer: Medicaid Other | Attending: Emergency Medicine | Admitting: Emergency Medicine

## 2017-02-27 ENCOUNTER — Other Ambulatory Visit: Payer: Self-pay

## 2017-02-27 DIAGNOSIS — Z8719 Personal history of other diseases of the digestive system: Secondary | ICD-10-CM | POA: Diagnosis not present

## 2017-02-27 DIAGNOSIS — Z87891 Personal history of nicotine dependence: Secondary | ICD-10-CM | POA: Diagnosis not present

## 2017-02-27 DIAGNOSIS — K625 Hemorrhage of anus and rectum: Secondary | ICD-10-CM | POA: Diagnosis not present

## 2017-02-27 LAB — COMPREHENSIVE METABOLIC PANEL
ALT: 21 U/L (ref 17–63)
AST: 28 U/L (ref 15–41)
Albumin: 4.4 g/dL (ref 3.5–5.0)
Alkaline Phosphatase: 76 U/L (ref 38–126)
Anion gap: 9 (ref 5–15)
BILIRUBIN TOTAL: 0.9 mg/dL (ref 0.3–1.2)
BUN: 10 mg/dL (ref 6–20)
CO2: 27 mmol/L (ref 22–32)
CREATININE: 0.64 mg/dL (ref 0.61–1.24)
Calcium: 9.3 mg/dL (ref 8.9–10.3)
Chloride: 104 mmol/L (ref 101–111)
GFR calc Af Amer: >60 mL/min (ref >60)
GFR calc non Af Amer: >60 mL/min (ref >60)
GLUCOSE: 104 mg/dL — AB (ref 65–99)
Potassium: 3.8 mmol/L (ref 3.5–5.1)
Sodium: 140 mmol/L (ref 135–145)
TOTAL PROTEIN: 7.7 g/dL (ref 6.5–8.1)

## 2017-02-27 LAB — CBC
HCT: 45.6 % (ref 40.0–52.0)
Hemoglobin: 15.1 g/dL (ref 13.0–18.0)
MCH: 32.4 pg (ref 26.0–34.0)
MCHC: 33.2 g/dL (ref 32.0–36.0)
MCV: 97.6 fL (ref 80.0–100.0)
PLATELETS: 231 10*3/uL (ref 150–440)
RBC: 4.67 MIL/uL (ref 4.40–5.90)
RDW: 13.6 % (ref 11.5–14.5)
WBC: 6.8 10*3/uL (ref 3.8–10.6)

## 2017-02-27 MED ORDER — HYDROCORTISONE ACETATE 25 MG RE SUPP: 25.0000 mg | Freq: Two times a day (BID) | RECTAL | 1 refills | Status: AC

## 2017-02-27 MED ORDER — HYDROCORTISONE 2.5 % RE CREA: 1.0000 "application " | TOPICAL_CREAM | Freq: Two times a day (BID) | RECTAL | 1 refills | Status: AC

## 2017-02-27 NOTE — ED Triage Notes (Signed)
Patient reports having rectal bleeding.  Reports history of internal hemorrhoids.

## 2017-02-27 NOTE — ED Notes (Signed)
Patient refused to leave without copy of labs. Per Dr. Mayford KnifeWilliams, labs were printed and given to the patient.

## 2017-02-27 NOTE — ED Provider Notes (Signed)
[facility] Emergency Department Provider Note       Time seen: ----------------------------------------- 10:34 PM on 02/27/2017 -----------------------------------------   I have reviewed the triage vital signs and the nursing notes.  HISTORY   Chief Complaint Rectal Bleeding    HPI Shane [name] is a 57 y.o. male with a history of anemia, anxiety, drug-seeking behavior, GERD, internal hemorrhoids who presents to the ED for rectal bleeding today.  Patient states he had at least one episode of rectal bleeding.  He reports a history of internal hemorrhoids and has been seen by general surgery in the past for same.  He denies any other complaints at this time other than chronic neck and back pain for which she is requesting narcotics.  Past Medical History:  Diagnosis Date  . Anemia   . Anxiety   . Drug-seeking behavior    hx of narcotics dependence, previously discharged from pain management clinic  . Encounter for blood transfusion 2005, 02/2014  . GERD (gastroesophageal reflux disease)   . Hemorrhoid   . Herniated cervical disc     Patient Active Problem List   Diagnosis Date Noted  . Hemorrhoid 05/09/2014  . Skin abnormality 05/09/2014    Past Surgical History:  Procedure Laterality Date  . CERVICAL FUSION    . COLONOSCOPY  2/16   Dr. Mechele Collin  . FRACTURE SURGERY Left    fractured femur  . prolapsed rectum surgery  04/30/1995   [facility], trans abdominal mesh repair per patient report  . SEPTOPLASTY      Allergies Codeine and Amoxicillin  Social History Social History   Tobacco Use  . Smoking status: Former Smoker    Years: 20.00    Last attempt to quit: 01/05/2005    Years since quitting: 12.1  . Smokeless tobacco: Current User  Substance Use Topics  . Alcohol use: Yes    Alcohol/week: 0.0 oz  . Drug use: No    Review of Systems Constitutional: Negative for fever. Cardiovascular: Negative for chest pain. Respiratory:  Negative for shortness of breath. Gastrointestinal: Positive for rectal bleeding Musculoskeletal: Positive for neck and back pain Skin: Negative for rash. Neurological: Negative for headaches, focal weakness or numbness.  All systems negative/normal/unremarkable except as stated in the HPI  ____________________________________________   PHYSICAL EXAM:  VITAL SIGNS: ED Triage Vitals [02/27/17 2026]  Enc Vitals Group     BP (!) 117/94     Pulse Rate 92     Resp 18     Temp 98.7 F (37.1 C)     Temp Source Oral     SpO2 100 %     Weight 160 lb (72.6 kg)     Height 5\' 9"  (1.753 m)     Head Circumference      Peak Flow      Pain Score      Pain Loc      Pain Edu?      Excl. in GC?     Constitutional: Alert and oriented. Well appearing and in no distress. Eyes: Conjunctivae are normal. Normal extraocular movements. Cardiovascular: Normal rate, regular rhythm. No murmurs, rubs, or gallops. Respiratory: Normal respiratory effort without tachypnea nor retractions. Breath sounds are clear and equal bilaterally. No wheezes/rales/rhonchi. Gastrointestinal: Soft and nontender. Normal bowel sounds Musculoskeletal: Nontender with normal range of motion in extremities. No lower extremity tenderness nor edema. Neurologic:  Normal speech and language. No gross focal neurologic deficits are appreciated.  Skin:  Skin is warm, dry  and intact. No rash noted. ____________________________________________  ED COURSE:  As part of my medical decision making, I reviewed the following data within the electronic MEDICAL RECORD NUMBER History obtained from family if available, nursing notes, old chart and ekg, as well as notes from prior ED visits. Patient presented for rectal bleeding, we will assess with labs and likely treat with suppositories   Procedures ____________________________________________   LABS (pertinent positives/negatives)  Labs Reviewed  COMPREHENSIVE METABOLIC PANEL - Abnormal;  Notable for the following components:      Result Value   Glucose, Bld 104 (*)    All other components within normal limits  CBC  POC OCCULT BLOOD, ED   ____________________________________________  DIFFERENTIAL DIAGNOSIS   Internal hemorrhoid, external hemorrhoid, diverticulosis, colonic polyp, chronic pain  FINAL ASSESSMENT AND PLAN  Rectal bleeding   Plan: Patient had presented for rectal bleeding. [name] labs are normal.  Patient will be discharged with rectal suppositories and rectal cream and is encouraged to continue follow-up on Tuesday as scheduled.  He is requesting narcotics for his chronic neck and back pain which I have declined to fill.   Shane DashJohnathan [name] Yahaira Bruski, MD   Note: This note was generated in part or whole with voice recognition software. Voice recognition is usually quite accurate but there are transcription errors that can and very often do occur. I apologize for any typographical errors that were not detected and corrected.     Shane [name], Shane Winner E, MD 02/27/17 2236

## 2017-02-27 NOTE — ED Notes (Signed)
Pt arrived via ems and brought to waiting room in a wheelchair; EMS reports pt has had intermittent rectal bleeding due to internal hemorrhoids for about 3 years; has an appointment with [facility] this next week for evaluation for surgery; pt reports bright red rectal bleeding and feeling dizzy with standing; history of GERD; taking iron supplement; HR 83, palpated pressure 140/?; oxygen 97% room air; pt's skin color pink

## 2017-04-07 ENCOUNTER — Emergency Department
Admission: EM | Admit: 2017-04-07 | Discharge: 2017-04-07 | Disposition: A | Discharge status: Home / Self Care | Payer: Medicaid Other | Attending: Emergency Medicine | Admitting: Emergency Medicine

## 2017-04-07 ENCOUNTER — Encounter: Payer: Self-pay | Admitting: Emergency Medicine

## 2017-04-07 DIAGNOSIS — R739 Hyperglycemia, unspecified: Secondary | ICD-10-CM

## 2017-04-07 DIAGNOSIS — R42 Dizziness and giddiness: Secondary | ICD-10-CM

## 2017-04-07 DIAGNOSIS — R358 Other polyuria: Secondary | ICD-10-CM | POA: Diagnosis not present

## 2017-04-07 DIAGNOSIS — H538 Other visual disturbances: Secondary | ICD-10-CM

## 2017-04-07 DIAGNOSIS — Z87891 Personal history of nicotine dependence: Secondary | ICD-10-CM | POA: Insufficient documentation

## 2017-04-07 DIAGNOSIS — R3589 Other polyuria: Secondary | ICD-10-CM

## 2017-04-07 LAB — CBC
HCT: 44.9 % (ref 40.0–52.0)
HEMOGLOBIN: 14.9 g/dL (ref 13.0–18.0)
MCH: 32 pg (ref 26.0–34.0)
MCHC: 33.1 g/dL (ref 32.0–36.0)
MCV: 96.5 fL (ref 80.0–100.0)
PLATELETS: 258 10*3/uL (ref 150–440)
RBC: 4.66 MIL/uL (ref 4.40–5.90)
RDW: 13.6 % (ref 11.5–14.5)
WBC: 7.6 10*3/uL (ref 3.8–10.6)

## 2017-04-07 LAB — URINALYSIS, COMPLETE (UACMP) WITH MICROSCOPIC
Bacteria, UA: NONE SEEN
Bilirubin Urine: NEGATIVE
GLUCOSE, UA: NEGATIVE mg/dL
Hgb urine dipstick: NEGATIVE
Ketones, ur: 5 mg/dL — AB
Leukocytes, UA: NEGATIVE
Nitrite: NEGATIVE
PROTEIN: NEGATIVE mg/dL
RBC / HPF: NONE SEEN RBC/hpf (ref 0–5)
SPECIFIC GRAVITY, URINE: 1.006 (ref 1.005–1.030)
Squamous Epithelial / LPF: NONE SEEN
pH: 5 (ref 5.0–8.0)

## 2017-04-07 LAB — BASIC METABOLIC PANEL
Anion gap: 8 (ref 5–15)
BUN: 10 mg/dL (ref 6–20)
CALCIUM: 9.5 mg/dL (ref 8.9–10.3)
CO2: 28 mmol/L (ref 22–32)
CREATININE: 0.79 mg/dL (ref 0.61–1.24)
Chloride: 104 mmol/L (ref 101–111)
GFR calc Af Amer: >60 mL/min (ref >60)
GFR calc non Af Amer: >60 mL/min (ref >60)
GLUCOSE: 144 mg/dL — AB (ref 65–99)
Potassium: 4 mmol/L (ref 3.5–5.1)
Sodium: 140 mmol/L (ref 135–145)

## 2017-04-07 LAB — GLUCOSE, CAPILLARY: Glucose-Capillary: 170 mg/dL — ABNORMAL HIGH (ref 65–99)

## 2017-04-07 NOTE — ED Provider Notes (Signed)
[facility] Emergency Department Provider Note  ____________________________________________  Time seen: Approximately 9:30 PM  I have reviewed the triage vital signs and the nursing notes.   HISTORY  Chief Complaint Dizziness    HPI COALTON ARCH is a 57 y.o. male with a family history of diabetes presenting with polyuria, and lightheadedness.  The patient reports that for the past 7 or 8 months, he has had an increase in urination.  The other night, he urinated 10 times within 30 minutes.  He does not have dysuria or hematuria.  In addition, the patient has a "swimmy headed" sensation "all the time."  It does not change with positional changes and he does not have an associated headache.  He has also noted blurred vision wearing over the last several months, the numbers on his microwave look hazy.  The patient denies any new symptoms today but states that he has been unable to see a primary care physician in [place] because he does not have transportation, and tonight he was able to pay his roommate $7 to bring him here.  The patient is concerned because he has a strong family history of diabetes.  Prior to coming to the hospital, he ate a hamburger and drinks sweet tea; his CBG here is 170.  Past Medical History:  Diagnosis Date  . Anemia   . Anxiety   . Drug-seeking behavior    hx of narcotics dependence, previously discharged from pain management clinic  . Encounter for blood transfusion 2005, 02/2014  . GERD (gastroesophageal reflux disease)   . Hemorrhoid   . Herniated cervical disc     Patient Active Problem List   Diagnosis Date Noted  . Hemorrhoid 05/09/2014  . Skin abnormality 05/09/2014    Past Surgical History:  Procedure Laterality Date  . CERVICAL FUSION    . COLONOSCOPY  2/16   Dr. Mechele Collin  . FRACTURE SURGERY Left    fractured femur  . prolapsed rectum surgery  04/30/1995   [facility], trans abdominal mesh repair per patient  report  . SEPTOPLASTY      Current Outpatient Rx  . Order #: [id number] Class: Historical Med  . Order #: [id number] Class: Historical Med  . Order #: [id number] Class: Historical Med  . Order #: [id number] Class: Historical Med  . Order #: [id number] Class: Historical Med  . Order #: [id number] Class: Historical Med  . Order #: [id number] Class: Print  . Order #: [id number] Class: Normal  . Order #: [id number] Class: Print  . Order #: [id number] Class: Print  . Order #: [id number] Class: Historical Med  . Order #: [id number] Class: Print  . Order #: [id number] Class: Print  . Order #: [id number] Class: Historical Med  . Order #: [id number] Class: Historical Med  . Order #: [id number] Class: Historical Med  . Order #: [id number] Class: Historical Med    Allergies Codeine and Amoxicillin  No family history on file.  Social History Social History   Tobacco Use  . Smoking status: Former Smoker    Years: 20.00    Last attempt to quit: 01/05/2005    Years since quitting: 12.2  . Smokeless tobacco: Current User  Substance Use Topics  . Alcohol use: Yes    Alcohol/week: 0.0 oz  . Drug use: No    Review of Systems Constitutional: No fever/chills.  Positive lightheadedness without syncope.  No dizziness.  No diaphoresis. Eyes: Positive progressive blurry vision.  No diplopia.. ENT: No sore throat. No congestion or rhinorrhea. Cardiovascular: Denies chest pain. Denies palpitations. Respiratory: Denies shortness of breath.  No cough. Gastrointestinal: No abdominal pain.  No nausea, no vomiting.  No diarrhea.  No constipation. Genitourinary: Negative for dysuria.  Positive for polyuria.  Negative for hematuria. Musculoskeletal: Negative for back pain. Skin: Negative for rash. Neurological: Negative for headaches. No focal numbness, tingling or weakness.  Positive blurred vision that has been progressive over months.  No diplopia.  No changes in speech or mental status.  No difficulty  walking.    ____________________________________________   PHYSICAL EXAM:  VITAL SIGNS: ED Triage Vitals  Enc Vitals Group     BP 04/07/17 2049 134/82     Pulse Rate 04/07/17 2049 84     Resp 04/07/17 2049 20     Temp 04/07/17 2049 98 F (36.7 C)     Temp Source 04/07/17 2049 Oral     SpO2 04/07/17 2049 100 %     Weight 04/07/17 2050 160 lb (72.6 kg)     Height 04/07/17 2050 5\' 9"  (1.753 m)     Head Circumference --      Peak Flow --      Pain Score 04/07/17 2055 0     Pain Loc --      Pain Edu? --      Excl. in GC? --     Constitutional: Alert and oriented. Well appearing and in no acute distress. Answers questions appropriately. Eyes: Conjunctivae are normal.  EOMI. No scleral icterus. Head: Atraumatic. Nose: No congestion/rhinnorhea. Mouth/Throat: Mucous membranes are moist.  Neck: No stridor.  Supple.  No JVD.  No meningismus. Cardiovascular: Normal rate, regular rhythm. No murmurs, rubs or gallops.  Respiratory: Normal respiratory effort.  No accessory muscle use or retractions. Lungs CTAB.  No wheezes, rales or ronchi. Gastrointestinal: Soft, nontender and nondistended.  No guarding or rebound.  No peritoneal signs. Musculoskeletal: No LE edema. No ttp in the calves or palpable cords.  Negative Homan's sign. Neurologic:  A&Ox3.  Speech is clear.  Face and smile are symmetric.  EOMI.  Moves all extremities well. Skin:  Skin is warm, dry and intact. No rash noted. Psychiatric: Normal mood with bizarre affect.  Normal speech and normal insight.  ____________________________________________   LABS (all labs ordered are listed, but only abnormal results are displayed)  Labs Reviewed  GLUCOSE, CAPILLARY - Abnormal; Notable for the following components:      Result Value   Glucose-Capillary 170 (*)    All other components within normal limits  CBC  BASIC METABOLIC PANEL  URINALYSIS, COMPLETE (UACMP) WITH MICROSCOPIC  CBG MONITORING, ED    ____________________________________________  EKG  ED ECG REPORT I, Rockne Menghini, the attending physician, personally viewed and interpreted this ECG.   Date: 04/07/2017  EKG Time: 2045  Rate: 78  Rhythm: normal sinus rhythm  Axis: normal  Intervals:none  ST&T Change: No STEMI  ____________________________________________  RADIOLOGY  No results found.  ____________________________________________   PROCEDURES  Procedure(s) performed: None  Procedures  Critical Care performed: No ____________________________________________   INITIAL IMPRESSION / ASSESSMENT AND PLAN / ED COURSE  Pertinent labs & imaging results that were available during my care of the patient were reviewed by me and considered in my medical decision making (see chart for details).  57 y.o. male presenting for months of polyuria, progressive blurred vision, and "swimmyheadedness."  Overall, the pt is hemodynamically stable and has no neurologic or cardiopulmonary abnormalities on exam.  His CBG is 170, but this is just after a hamburger and sweet tea.  Plan to r/u UTi. I  have offered to give the pt information about a PMD office in [place], which will be closer to his home and easier for follow up.  ____________________________________________  FINAL CLINICAL IMPRESSION(S) / ED DIAGNOSES  Final diagnoses:  Polyuria  Blurred vision, bilateral  Lightheadedness         NEW MEDICATIONS STARTED DURING THIS VISIT:  New Prescriptions   No medications on file      Rockne MenghiniNorman, Anne-Caroline, MD 04/07/17 2139

## 2017-04-07 NOTE — Discharge Instructions (Addendum)
Please return to the emergency department if you develop severe pain, fever, inability to keep down fluids, or any other symptoms concerning to you. °

## 2017-04-07 NOTE — ED Triage Notes (Signed)
First nurse note: Patient to front desk c/o dizziness and light headedness, feeling "jittery". Patient states he was seen here recently and told his glucose was elevated, but has been unable to follow up with PCP for further testing for diabetes (glucose on lab sheet patient brought was 104). Patient also reports intermittent blurry vision. Patient states he just ate, but doesn't feel any better or different than prior to eating. Patient declined wheelchair. Ambulatory with steady gait. Denies any unilateral weakness.

## 2017-09-25 ENCOUNTER — Encounter: Payer: Self-pay | Admitting: Emergency Medicine

## 2017-09-25 ENCOUNTER — Other Ambulatory Visit: Payer: Self-pay

## 2017-09-25 ENCOUNTER — Emergency Department
Admission: EM | Admit: 2017-09-25 | Discharge: 2017-09-25 | Disposition: A | Discharge status: Home / Self Care | Payer: Medicaid Other | Attending: Emergency Medicine | Admitting: Emergency Medicine

## 2017-09-25 DIAGNOSIS — Z87891 Personal history of nicotine dependence: Secondary | ICD-10-CM | POA: Diagnosis not present

## 2017-09-25 DIAGNOSIS — K625 Hemorrhage of anus and rectum: Secondary | ICD-10-CM

## 2017-09-25 DIAGNOSIS — Z79899 Other long term (current) drug therapy: Secondary | ICD-10-CM | POA: Diagnosis not present

## 2017-09-25 LAB — CBC
HCT: 46.5 % (ref 40.0–52.0)
HEMOGLOBIN: 15.8 g/dL (ref 13.0–18.0)
MCH: 33.6 pg (ref 26.0–34.0)
MCHC: 34.1 g/dL (ref 32.0–36.0)
MCV: 98.6 fL (ref 80.0–100.0)
Platelets: 240 10*3/uL (ref 150–440)
RBC: 4.72 MIL/uL (ref 4.40–5.90)
RDW: 13.7 % (ref 11.5–14.5)
WBC: 6.8 10*3/uL (ref 3.8–10.6)

## 2017-09-25 LAB — COMPREHENSIVE METABOLIC PANEL
ALK PHOS: 78 U/L (ref 38–126)
ALT: 23 U/L (ref 0–44)
ANION GAP: 6 (ref 5–15)
AST: 27 U/L (ref 15–41)
Albumin: 4.2 g/dL (ref 3.5–5.0)
BILIRUBIN TOTAL: 1.1 mg/dL (ref 0.3–1.2)
BUN: 7 mg/dL (ref 6–20)
CALCIUM: 9.5 mg/dL (ref 8.9–10.3)
CO2: 30 mmol/L (ref 22–32)
Chloride: 105 mmol/L (ref 98–111)
Creatinine, Ser: 0.82 mg/dL (ref 0.61–1.24)
GLUCOSE: 87 mg/dL (ref 70–99)
Potassium: 4.4 mmol/L (ref 3.5–5.1)
Sodium: 141 mmol/L (ref 135–145)
TOTAL PROTEIN: 7.3 g/dL (ref 6.5–8.1)

## 2017-09-25 MED ORDER — HYDROCORTISONE ACETATE 25 MG RE SUPP: 25.0000 mg | Freq: Two times a day (BID) | RECTAL | 1 refills | Status: AC

## 2017-09-25 MED ORDER — GLYCERIN (ADULT) 2 G RE SUPP: 1.0000 | RECTAL | 0 refills | Status: AC | PRN

## 2017-09-25 NOTE — ED Notes (Signed)
ED Provider at bedside. 

## 2017-09-25 NOTE — ED Provider Notes (Signed)
[facility] Emergency Department Provider Note   ____________________________________________   First MD Initiated Contact with Patient 09/25/17 2000     (approximate)  I have reviewed the triage vital signs and the nursing notes.   HISTORY  Chief Complaint GI Bleeding   HPI Shane [name] is a 57 y.o. male patient reports he has had GI bleeding off and on for many years.  He said his primary care doctor told him he has internal hemorrhoids.  He says Dr. Doristine Counter told him he had to jellybean size hemorrhoids inside sometime ago and offered to cut them out but he does not want to do that he wants to get them banded.  He is also seeing gastroenterology.  It he says he has had 3 blood transfusions last one was in 2016.  Past Medical History:  Diagnosis Date  . Anemia   . Anxiety   . Drug-seeking behavior    hx of narcotics dependence, previously discharged from pain management clinic  . Encounter for blood transfusion 2005, 02/2014  . GERD (gastroesophageal reflux disease)   . Hemorrhoid   . Herniated cervical disc     Patient Active Problem List   Diagnosis Date Noted  . Hemorrhoid 05/09/2014  . Skin abnormality 05/09/2014    Past Surgical History:  Procedure Laterality Date  . CERVICAL FUSION    . COLONOSCOPY  2/16   Dr. Mechele Collin  . FRACTURE SURGERY Left    fractured femur  . prolapsed rectum surgery  04/30/1995   [facility], trans abdominal mesh repair per patient report  . SEPTOPLASTY      Prior to Admission medications   Medication Sig Start Date End Date Taking? Authorizing Provider  cyclobenzaprine (FLEXERIL) 10 MG tablet Take 1 tablet by mouth 2 (two) times daily as needed. 09/04/16   [provider]  docusate sodium (COLACE) 100 MG capsule Take 250 mg by mouth 2 (two) times daily.     [provider]  Ferrous Sulfate (IRON) 325 (65 FE) MG TABS Take 1 tablet by mouth daily.    [provider]  fluticasone  (FLONASE) 50 MCG/ACT nasal spray Place 1 spray into both nostrils daily.  04/20/14   [provider]  gabapentin (NEURONTIN) 100 MG capsule Take 1 capsule by mouth 3 (three) times daily. 10/02/16   [provider]  glycerin adult 2 g suppository Place 1 suppository rectally as needed for constipation. 09/25/17   Arnaldo Natal, MD  HYDROcodone-acetaminophen (NORCO) 10-325 MG per tablet Take 1 tablet by mouth every 6 (six) hours as needed.  03/02/14   [provider]  HYDROcodone-acetaminophen (NORCO) 10-325 MG tablet Take 1 tablet by mouth every 6 (six) hours as needed. 10/22/15   Menshew, Charlesetta Ivory, PA-C  hydrocortisone (ANUSOL-HC) 25 MG suppository Place 1 suppository (25 mg total) rectally 2 (two) times daily. 05/22/14   Earline Mayotte, MD  hydrocortisone (ANUSOL-HC) 25 MG suppository Place 1 suppository (25 mg total) rectally every 12 (twelve) hours. 02/27/17 02/27/18  Emily Filbert, MD  hydrocortisone (ANUSOL-HC) 25 MG suppository Place 1 suppository (25 mg total) rectally every 12 (twelve) hours. 09/25/17 09/25/18  Arnaldo Natal, MD  hydrocortisone (PROCTOSOL HC) 2.5 % rectal cream Place 1 application rectally 2 (two) times daily. 02/27/17   Emily Filbert, MD  hydrocortisone 2.5 % cream Apply 1 application topically daily. 11/30/16   [provider]  hydrocortisone-pramoxine (PROCTOFOAM HC) rectal foam Place 1 applicator rectally 2 (two) times  daily. 12/22/16   Loleta Rose, MD  hydrOXYzine (ATARAX/VISTARIL) 10 MG tablet Take 1 tablet (10 mg total) by mouth 3 (three) times daily as needed for itching. 04/12/16   Schaevitz, Myra Rude, MD  methocarbamol (ROBAXIN) 750 MG tablet Take 750 mg by mouth at bedtime.  03/02/14   [provider]  omeprazole (PRILOSEC) 20 MG capsule Take 20 mg by mouth daily.  04/16/14   [provider]  pantoprazole (PROTONIX) 40 MG tablet Take 40 mg by mouth daily. 11/30/16   [provider]    terbinafine (LAMISIL) 250 MG tablet Take 250 mg by mouth daily. 08/11/16   [provider]    Allergies Codeine and Amoxicillin  History reviewed. No pertinent family history.  Social History Social History   Tobacco Use  . Smoking status: Former Smoker    Years: 20.00    Last attempt to quit: 01/05/2005    Years since quitting: 12.7  . Smokeless tobacco: Current User  Substance Use Topics  . Alcohol use: Yes    Alcohol/week: 0.0 standard drinks  . Drug use: No    Review of Systems  Constitutional: No fever/chills Eyes: No visual changes. ENT: No sore throat. Cardiovascular: Denies chest pain. Respiratory: Denies shortness of breath. Gastrointestinal: No abdominal pain.  No nausea, no vomiting.  No diarrhea.  No constipation. Genitourinary: Negative for dysuria. Musculoskeletal: Negative for back pain. Skin: Negative for rash. Neurological: Negative for headaches, focal weakness   ____________________________________________   PHYSICAL EXAM:  VITAL SIGNS: ED Triage Vitals  Enc Vitals Group     BP 09/25/17 1716 (!) 115/91     Pulse Rate 09/25/17 1716 86     Resp 09/25/17 1716 20     Temp 09/25/17 1716 98.2 F (36.8 C)     Temp Source 09/25/17 1716 Oral     SpO2 09/25/17 1716 99 %     Weight 09/25/17 1716 160 lb (72.6 kg)     Height 09/25/17 1716 5\' 10"  (1.778 m)     Head Circumference --      Peak Flow --      Pain Score 09/25/17 2043 0     Pain Loc --      Pain Edu? --      Excl. in GC? --     Constitutional: Alert and oriented. Well appearing and in no acute distress. Eyes: Conjunctivae are normal.  Head: Atraumatic. Nose: No congestion/rhinnorhea. Mouth/Throat: Mucous membranes are moist.  Oropharynx non-erythematous. Neck: No stridor.   Cardiovascular: Normal rate, regular rhythm. Grossly normal heart sounds.  Good peripheral circulation. Respiratory: Normal respiratory effort.  No retractions. Lungs CTAB. Gastrointestinal: Soft and  nontender. No distention. No abdominal bruits. No CVA tenderness. Rectal: No external hemorrhoids no obvious bleeding no palpable internal hemorrhoids no masses stool is brown and Hemoccult negative musculoskeletal: No lower extremity tenderness nor edema.  No joint effusions. Neurologic:  Normal speech and language. No gross focal neurologic deficits are appreciated. No gait instability. Skin:  Skin is warm, dry and intact. No rash noted. Psychiatric: Mood and affect are normal. Speech and behavior are normal.  ____________________________________________   LABS (all labs ordered are listed, but only abnormal results are displayed)  Labs Reviewed  COMPREHENSIVE METABOLIC PANEL  CBC  TYPE AND SCREEN   ____________________________________________  EKG   ____________________________________________  RADIOLOGY  ED MD interpretation:    Official radiology report(s): No results found.  ____________________________________________   PROCEDURES  Procedure(s) performed:  Procedures  Critical Care performed:  ____________________________________________   INITIAL IMPRESSION / ASSESSMENT AND PLAN / ED COURSE  Discussed patient with Dr. Allegra LaiVanga gastroenterologist on call she will follow-up in the office [facility]  Recommend Anusol suppositories and may be glycerin suppositories as well with some Anusol stuck on the end of them.  Patient course will return if he is worse.         ____________________________________________   FINAL CLINICAL IMPRESSION(S) / ED DIAGNOSES  Final diagnoses:  Rectal bleeding     ED Discharge Orders         Ordered    glycerin adult 2 g suppository  As needed     09/25/17 2110    hydrocortisone (ANUSOL-HC) 25 MG suppository  Every 12 hours     09/25/17 2110           Note:  This document was prepared using Dragon voice recognition software and may include unintentional dictation errors.    Arnaldo NatalMalinda,  Paul F, MD 09/25/17 2111

## 2017-09-25 NOTE — ED Notes (Signed)
Discharge instructions reviewed with patient. Questions fielded by this RN. Patient verbalizes understanding of instructions. Patient discharged home in stable condition per malinda. No acute distress noted at time of discharge.   No peripheral IV placed this visit.

## 2017-09-25 NOTE — ED Notes (Signed)
Pt reports vision went "yellow" after after going to sitting position from lying, requesting meal tray

## 2017-09-25 NOTE — ED Triage Notes (Addendum)
Pt arrived via POV with reports of ongoing rectal bleeding that he has had problems with over the past several years.   Pt reports he has been having bright red blood when wiping. Pt showed picture in triage of large amount of bright red blood on toilet paper.    Pt states he has a hx of internal hemorrhoids.  Pt reports he has been having some dizziness as well and has hx of receiving blood transfusions.  Pt unsure if he has noticed any dark stools.  Pt states he has followed up with GI in the past and has had colonoscopy and anoscopy.  Pt states he also has hx of rectal prolapse.

## 2017-09-25 NOTE — ED Notes (Addendum)
Pt reports a hx of bright red blood from rectum post BM, hx of polyps and GI bleeds  No bleeding att and denies pain

## 2017-09-25 NOTE — Discharge Instructions (Signed)
Please return for heavier bleeding or more lightheadedness.  Use the Anusol suppository 1 in the rectum every 12 hours.  You can use a glycerin suppository in between if needed to make the stool soft.  We will also help lubricate things.  Please follow-up with Dr. Allegra LaiVanga.  She is a gastroenterologist.  Of course if you wish to go to [facility] that is fine to.  At the present time you do not need a transfusion.  Everything looks good right this minute.

## 2017-09-26 LAB — BPAM RBC
Blood Product Expiration Date: 201910162359
Blood Product Expiration Date: 201910182359
UNIT TYPE AND RH: 5100
Unit Type and Rh: 5100

## 2017-09-26 LAB — TYPE AND SCREEN
ABO/RH(D): AB POS
Antibody Screen: NEGATIVE
UNIT DIVISION: 0
Unit division: 0

## 2017-09-27 ENCOUNTER — Telehealth: Payer: Self-pay | Admitting: Gastroenterology

## 2017-09-27 NOTE — Telephone Encounter (Signed)
Attempted to call pt to schedule ed fu  Busy  tone

## 2017-10-11 ENCOUNTER — Encounter: Payer: Self-pay | Admitting: Gastroenterology

## 2018-09-29 ENCOUNTER — Other Ambulatory Visit: Payer: Self-pay

## 2018-09-29 ENCOUNTER — Encounter: Payer: Self-pay | Admitting: Emergency Medicine

## 2018-09-29 DIAGNOSIS — Y999 Unspecified external cause status: Secondary | ICD-10-CM | POA: Insufficient documentation

## 2018-09-29 DIAGNOSIS — Y939 Activity, unspecified: Secondary | ICD-10-CM | POA: Diagnosis not present

## 2018-09-29 DIAGNOSIS — Y929 Unspecified place or not applicable: Secondary | ICD-10-CM | POA: Diagnosis not present

## 2018-09-29 DIAGNOSIS — W07XXXA Fall from chair, initial encounter: Secondary | ICD-10-CM | POA: Diagnosis not present

## 2018-09-29 DIAGNOSIS — Z87891 Personal history of nicotine dependence: Secondary | ICD-10-CM | POA: Diagnosis not present

## 2018-09-29 DIAGNOSIS — M545 Low back pain: Secondary | ICD-10-CM | POA: Diagnosis present

## 2018-09-29 DIAGNOSIS — Z79899 Other long term (current) drug therapy: Secondary | ICD-10-CM | POA: Diagnosis not present

## 2018-09-29 DIAGNOSIS — M5431 Sciatica, right side: Secondary | ICD-10-CM | POA: Insufficient documentation

## 2018-09-29 NOTE — ED Triage Notes (Addendum)
Patient ambulatory to triage with steady gait, without difficulty or distress noted, mask in place; pt reports hx back surgery; st fell recently and is having back pain radiating down rt leg and into foot

## 2018-09-30 ENCOUNTER — Emergency Department
Admission: EM | Admit: 2018-09-30 | Discharge: 2018-09-30 | Disposition: A | Discharge status: Home / Self Care | Payer: Medicaid Other | Attending: Emergency Medicine | Admitting: Emergency Medicine

## 2018-09-30 ENCOUNTER — Emergency Department: Payer: Medicaid Other

## 2018-09-30 DIAGNOSIS — M5431 Sciatica, right side: Secondary | ICD-10-CM

## 2018-09-30 MED ORDER — TRAMADOL HCL 50 MG PO TABS: 50.0000 mg | ORAL_TABLET | Freq: Four times a day (QID) | ORAL | 0 refills | Status: DC | PRN

## 2018-09-30 MED ORDER — LIDOCAINE 5 % EX PTCH
2.0000 | MEDICATED_PATCH | CUTANEOUS | Status: DC
  Administered 2018-09-30: 2 via TRANSDERMAL
  Filled 2018-09-30: qty 2

## 2018-09-30 MED ORDER — KETOROLAC TROMETHAMINE 10 MG PO TABS
10.0000 mg | ORAL_TABLET | Freq: Once | ORAL | Status: DC
  Filled 2018-09-30: qty 1

## 2018-09-30 NOTE — ED Notes (Signed)
Pt's asked multiple times to speak to Dr. Owens Shark after Dr. Owens Shark had evaluated him. Dr. Owens Shark made aware of pt's desire to speak to him regarding obtaining narcotic pain meds for his chronic pain which was exacerbated by a fall x 2 days ago. Explained to pt that we could not administer narcotics if he was driving himself home. Pt encouraged to obtain a ride home. Pt suggested we give him one does of oral medication, said that he was told by his doctor that it took 40 minutes before oral narcotics began to work, said that he knew what the law was regarding driving under the influence. Pt made several vaguely threatening statements regarding other RN's of his acquaintance. When I attempted to administer the medication that was ordered by the EDP, pt began stating that Dr. Owens Shark was giving him tramadol, not toradol. Pt stated he was not refusing the medication, but he wanted to talk to Dr. Owens Shark before he took it. Pt stated he never used lidocaine patches before and I applied the first to the back of his thigh without incident. Pt then indicated his other area of extreme pain was his mid back. When I applied that patch, pt arched his back and swung his arms behind him , striking me on my arms while I was attempting to apply the lidocaine patch. At this point, I stopped my interaction with this patient and asked another RN to discharge him.

## 2018-09-30 NOTE — ED Notes (Signed)
This RN in room with MD for assessment. Pt requesting pain medication to take home and is informed that he will not be provided by such. Pt requesting that this RN leave room to speak with MD.

## 2018-09-30 NOTE — ED Provider Notes (Signed)
[facility] Emergency Department Provider Note   First MD Initiated Contact with Patient 09/30/18 0034     (approximate)  I have reviewed the triage vital signs and the nursing notes.   HISTORY  Chief Complaint Back Pain   HPI Shane [name] is a 58 y.o. male with below list of previous medical conditions including chronic pain and narcotic seeking behavior presents to the emergency department secondary to history of accidental fall from a chair yesterday with resultant neck and low back injury.  Patient states 10 out of 10 pain since that time.  Patient states his low back pain radiates down the posterior portion of his right leg.  Patient denies any weakness numbness gait instability.  Patient denies any urinary or bowel incontinence.  Patient denies any head injury no loss of consciousness.        Past Medical History:  Diagnosis Date   Anemia    Anxiety    Drug-seeking behavior    hx of narcotics dependence, previously discharged from pain management clinic   Encounter for blood transfusion 2005, 02/2014   GERD (gastroesophageal reflux disease)    Hemorrhoid    Herniated cervical disc     Patient Active Problem List   Diagnosis Date Noted   Hemorrhoid 05/09/2014   Skin abnormality 05/09/2014    Past Surgical History:  Procedure Laterality Date   CERVICAL FUSION     COLONOSCOPY  2/16   Dr. Vira Agar   FRACTURE SURGERY Left    fractured femur   prolapsed rectum surgery  04/30/1995   [facility], trans abdominal mesh repair per patient report   SEPTOPLASTY      Prior to Admission medications   Medication Sig Start Date End Date Taking? Authorizing Provider  cyclobenzaprine (FLEXERIL) 10 MG tablet Take 1 tablet by mouth 2 (two) times daily as needed. 09/04/16   [provider]  docusate sodium (COLACE) 100 MG capsule Take 250 mg by mouth 2 (two) times daily.     [provider]  Ferrous Sulfate (IRON) 325  (65 FE) MG TABS Take 1 tablet by mouth daily.    [provider]  fluticasone (FLONASE) 50 MCG/ACT nasal spray Place 1 spray into both nostrils daily.  04/20/14   [provider]  gabapentin (NEURONTIN) 100 MG capsule Take 1 capsule by mouth 3 (three) times daily. 10/02/16   [provider]  glycerin adult 2 g suppository Place 1 suppository rectally as needed for constipation. 09/25/17   Nena Polio, MD  HYDROcodone-acetaminophen (NORCO) 10-325 MG per tablet Take 1 tablet by mouth every 6 (six) hours as needed.  03/02/14   [provider]  HYDROcodone-acetaminophen (NORCO) 10-325 MG tablet Take 1 tablet by mouth every 6 (six) hours as needed. 10/22/15   Menshew, Dannielle Karvonen, PA-C  hydrocortisone (ANUSOL-HC) 25 MG suppository Place 1 suppository (25 mg total) rectally 2 (two) times daily. 05/22/14   Robert Bellow, MD  hydrocortisone (PROCTOSOL HC) 2.5 % rectal cream Place 1 application rectally 2 (two) times daily. 02/27/17   Earleen Newport, MD  hydrocortisone 2.5 % cream Apply 1 application topically daily. 11/30/16   [provider]  hydrocortisone-pramoxine (PROCTOFOAM HC) rectal foam Place 1 applicator rectally 2 (two) times daily. 12/22/16   Hinda Kehr, MD  hydrOXYzine (ATARAX/VISTARIL) 10 MG tablet Take 1 tablet (10 mg total) by mouth 3 (three) times daily as needed for itching. 04/12/16   Schaevitz, Randall An, MD  methocarbamol Sherlene Shams)  750 MG tablet Take 750 mg by mouth at bedtime.  03/02/14   [provider]  omeprazole (PRILOSEC) 20 MG capsule Take 20 mg by mouth daily.  04/16/14   [provider]  pantoprazole (PROTONIX) 40 MG tablet Take 40 mg by mouth daily. 11/30/16   [provider]  terbinafine (LAMISIL) 250 MG tablet Take 250 mg by mouth daily. 08/11/16   [provider]  traMADol (ULTRAM) 50 MG tablet Take 1 tablet (50 mg total) by mouth every 6 (six) hours as needed. 09/30/18 09/30/19   Darci Current, MD    Allergies Codeine and Amoxicillin  No family history on file.  Social History Social History   Tobacco Use   Smoking status: Former Smoker    Years: 20.00    Quit date: 01/05/2005    Years since quitting: 13.7   Smokeless tobacco: Current User  Substance Use Topics   Alcohol use: Yes    Alcohol/week: 0.0 standard drinks   Drug use: No    Review of Systems Constitutional: No fever/chills Eyes: No visual changes. ENT: No sore throat. Cardiovascular: Denies chest pain. Respiratory: Denies shortness of breath. Gastrointestinal: No abdominal pain.  No nausea, no vomiting.  No diarrhea.  No constipation. Genitourinary: Negative for dysuria. Musculoskeletal: Positive for neck pain.  Positive for back pain. Integumentary: Negative for rash. Neurological: Negative for headaches, focal weakness or numbness.   ____________________________________________   PHYSICAL EXAM:  VITAL SIGNS: ED Triage Vitals  Enc Vitals Group     BP 09/29/18 2225 140/87     Pulse Rate 09/29/18 2225 92     Resp 09/29/18 2225 20     Temp 09/29/18 2225 98.7 F (37.1 C)     Temp Source 09/29/18 2225 Oral     SpO2 --      Weight --      Height --      Head Circumference --      Peak Flow --      Pain Score 09/29/18 2224 10     Pain Loc --      Pain Edu? --      Excl. in GC? --     Constitutional: Alert and oriented.  Eyes: Conjunctivae are normal.  Head: Atraumatic.Marland Kitchen Mouth/Throat: Mucous membranes are moist. Neck: No stridor.  No meningeal signs.   Cardiovascular: Normal rate, regular rhythm. Good peripheral circulation. Grossly normal heart sounds. Respiratory: Normal respiratory effort.  No retractions. Gastrointestinal: Soft and nontender. No distention.  Musculoskeletal: No lower extremity tenderness nor edema. No gross deformities of extremities. Neurologic:  Normal speech and language. No gross focal neurologic deficits are appreciated.  Skin:  Skin is  warm, dry and intact. Psychiatric: Mood and affect are normal. Speech and behavior are normal.  _______  RADIOLOGY I, [place] N Zakary Kimura, personally viewed and evaluated these images (plain radiographs) as part of my medical decision making, as well as reviewing the written report by the radiologist.  ED MD interpretation: Cervical spine and lumbar spine x-ray revealed no evidence of fracture  Official radiology report(s): Dg Cervical Spine Complete  Result Date: 09/30/2018 CLINICAL DATA:  Cervical neck pain after fall. EXAM: CERVICAL SPINE - COMPLETE 4+ VIEW COMPARISON:  None. FINDINGS: Anterior fusion C4-C6. Hardware is intact with interbody spacers. Straightening of normal lordosis secondary to fusion hardware. No evidence of traumatic subluxation. Adjacent level degenerative disc disease at C6-C7 and to a lesser extent C3-C4. No evidence of fracture. The dens is grossly intact. Lateral masses of  C1 are well aligned on C2. No prevertebral soft tissue edema. IMPRESSION: 1. No radiographic evidence of cervical spine fracture. 2. Degenerative and postsurgical change. Electronically Signed   By: Narda RutherfordMelanie  Sanford M.D.   On: 09/30/2018 01:54   Dg Lumbar Spine Complete  Result Date: 09/30/2018 CLINICAL DATA:  Lumbar back pain after fall. EXAM: LUMBAR SPINE - COMPLETE 4+ VIEW COMPARISON:  11/07/2010 FINDINGS: There are 6 non-rib-bearing lumbar vertebra as before. The alignment is maintained. Vertebral body heights are normal. There is no listhesis. The posterior elements are intact. Multilevel endplate spurring, minimal disc space narrowing L5-S1. No fracture. Sacroiliac joints are symmetric and normal. IMPRESSION: 1. No fracture of the lumbar spine. 2. Mild multilevel spondylosis. Electronically Signed   By: Narda RutherfordMelanie  Sanford M.D.   On: 09/30/2018 01:56     Procedures   ____________________________________________   INITIAL IMPRESSION / MDM / ASSESSMENT AND PLAN / ED COURSE  As part of my  medical decision making, I reviewed the following data within the electronic MEDICAL RECORD NUMBER   58 year old male presenting with above-stated history and physical exam secondary to symptoms consistent with sciatica.  Patient states that he has been taking Flexeril and ibuprofen at home without any relief.  Patient has a history of drug-seeking behavior however when I reviewed the patient's narcotic database information patient has received no narcotics in the past 6 months and as such patient was given tramadol with recommendation to follow-up with orthopedic surgeon as planned  ____________________________________________  FINAL CLINICAL IMPRESSION(S) / ED DIAGNOSES  Final diagnoses:  Sciatica of right side     MEDICATIONS GIVEN DURING THIS VISIT:  Medications - No data to display   ED Discharge Orders         Ordered    traMADol (ULTRAM) 50 MG tablet  Every 6 hours PRN     09/30/18 0214          *Please note:  Shane [name] was evaluated in Emergency Department on 09/30/2018 for the symptoms described in the history of present illness. He was evaluated in the context of the global COVID-19 pandemic, which necessitated consideration that the patient might be at risk for infection with the SARS-CoV-2 virus that causes COVID-19. Institutional protocols and algorithms that pertain to the evaluation of patients at risk for COVID-19 are in a state of rapid change based on information released by regulatory bodies including the CDC and federal and state organizations. These policies and algorithms were followed during the patient's care in the ED.  Some ED evaluations and interventions may be delayed as a result of limited staffing during the pandemic.*  Note:  This document was prepared using Dragon voice recognition software and may include unintentional dictation errors.   Darci CurrentBrown, [place] N, MD 09/30/18 [phone]

## 2018-09-30 NOTE — ED Notes (Signed)
Patient was extremely agitated on discharge; explained to patient that we cannot give extended doses of pain medication and that he needs to follow up with primary care to manage pain. Instructed to call primary care today and let them know that patient has been seen in the emergency room and they may be able to get him in sooner. Patient still argumentative, but discharged

## 2018-10-10 ENCOUNTER — Other Ambulatory Visit: Payer: Self-pay

## 2018-10-10 ENCOUNTER — Emergency Department
Admission: EM | Admit: 2018-10-10 | Discharge: 2018-10-11 | Disposition: A | Discharge status: Home / Self Care | Payer: Medicaid Other | Attending: Student | Admitting: Student

## 2018-10-10 ENCOUNTER — Emergency Department: Payer: Medicaid Other

## 2018-10-10 DIAGNOSIS — G8929 Other chronic pain: Secondary | ICD-10-CM

## 2018-10-10 DIAGNOSIS — W1789XD Other fall from one level to another, subsequent encounter: Secondary | ICD-10-CM | POA: Diagnosis not present

## 2018-10-10 DIAGNOSIS — M5441 Lumbago with sciatica, right side: Secondary | ICD-10-CM | POA: Diagnosis not present

## 2018-10-10 DIAGNOSIS — Z79899 Other long term (current) drug therapy: Secondary | ICD-10-CM | POA: Insufficient documentation

## 2018-10-10 DIAGNOSIS — Z87891 Personal history of nicotine dependence: Secondary | ICD-10-CM | POA: Diagnosis not present

## 2018-10-10 DIAGNOSIS — M545 Low back pain: Secondary | ICD-10-CM | POA: Diagnosis present

## 2018-10-10 NOTE — ED Triage Notes (Signed)
Pt to the er for back pain, neck pain, sciatic nerve pain after a fall. Pt has a long history of back and neck pain. Pt states he needs an MRI but has not gotten it done. Pt is ambulatory.

## 2018-10-10 NOTE — ED Provider Notes (Signed)
[facility] Emergency Department Provider Note  ____________________________________________  Time seen: Approximately 11:40 PM  I have reviewed the triage vital signs and the nursing notes.   HISTORY  Chief Complaint Back Pain    HPI Shane [name] is a 58 y.o. male who presents the emergency department complaining of back pain, pain in his right lower extremity.  Patient was seen in this department on 09/30/2018 after falling.  Patient was standing on a stool trying to change a light bulb when he fell backwards.  Patient was evaluated, discharged with tramadol.  Patient reports that it helped somewhat with his pain, however now his pain has worsened.  Patient denies any new injury.  He continues to complain of low back pain with pain extending down the right lower extremity.  Patient has history of chronic back problems.  He states that he has a neurosurgeon who has recommended that he has surgery in his lower back.  Patient denies any bowel or bladder dysfunction, saddle anesthesia or paresthesias.  Other than Ultram, no medications for this complaint.  Patient denies any urinary or GI complaints.  No other complaints at this time.        Past Medical History:  Diagnosis Date  . Anemia   . Anxiety   . Drug-seeking behavior    hx of narcotics dependence, previously discharged from pain management clinic  . Encounter for blood transfusion 2005, 02/2014  . GERD (gastroesophageal reflux disease)   . Hemorrhoid   . Herniated cervical disc     Patient Active Problem List   Diagnosis Date Noted  . Hemorrhoid 05/09/2014  . Skin abnormality 05/09/2014    Past Surgical History:  Procedure Laterality Date  . CERVICAL FUSION    . COLONOSCOPY  2/16   Dr. Mechele Collin  . FRACTURE SURGERY Left    fractured femur  . prolapsed rectum surgery  04/30/1995   [facility], trans abdominal mesh repair per patient report  . SEPTOPLASTY      Prior to Admission  medications   Medication Sig Start Date End Date Taking? Authorizing Provider  cyclobenzaprine (FLEXERIL) 10 MG tablet Take 1 tablet by mouth 2 (two) times daily as needed. 09/04/16   [provider]  docusate sodium (COLACE) 100 MG capsule Take 250 mg by mouth 2 (two) times daily.     [provider]  Ferrous Sulfate (IRON) 325 (65 FE) MG TABS Take 1 tablet by mouth daily.    [provider]  fluticasone (FLONASE) 50 MCG/ACT nasal spray Place 1 spray into both nostrils daily.  04/20/14   [provider]  gabapentin (NEURONTIN) 100 MG capsule Take 1 capsule by mouth 3 (three) times daily. 10/02/16   [provider]  glycerin adult 2 g suppository Place 1 suppository rectally as needed for constipation. 09/25/17   Arnaldo Natal, MD  HYDROcodone-acetaminophen (NORCO) 10-325 MG per tablet Take 1 tablet by mouth every 6 (six) hours as needed.  03/02/14   [provider]  HYDROcodone-acetaminophen (NORCO) 10-325 MG tablet Take 1 tablet by mouth every 6 (six) hours as needed. 10/22/15   Menshew, Charlesetta Ivory, PA-C  HYDROcodone-acetaminophen (NORCO/VICODIN) 5-325 MG tablet Take 1 tablet by mouth every 4 (four) hours as needed for moderate pain. 10/11/18   [place]uka Churchill, Delorise Royals, PA-C  hydrocortisone (ANUSOL-HC) 25 MG suppository Place 1 suppository (25 mg total) rectally 2 (two) times daily. 05/22/14   Earline Mayotte, MD  hydrocortisone (PROCTOSOL HC) 2.5 % rectal cream  Place 1 application rectally 2 (two) times daily. 02/27/17   Emily FilbertWilliams, Rhanda Lemire E, MD  hydrocortisone 2.5 % cream Apply 1 application topically daily. 11/30/16   [provider]  hydrocortisone-pramoxine (PROCTOFOAM HC) rectal foam Place 1 applicator rectally 2 (two) times daily. 12/22/16   Loleta RoseForbach, Cory, MD  hydrOXYzine (ATARAX/VISTARIL) 10 MG tablet Take 1 tablet (10 mg total) by mouth 3 (three) times daily as needed for itching. 04/12/16   Schaevitz, Myra Rudeavid Matthew, MD   methocarbamol (ROBAXIN) 500 MG tablet Take 1 tablet (500 mg total) by mouth 4 (four) times daily. 10/10/18   Alexsa Flaum, Delorise RoyalsJonathan D, PA-C  methocarbamol (ROBAXIN) 750 MG tablet Take 750 mg by mouth at bedtime.  03/02/14   [provider]  omeprazole (PRILOSEC) 20 MG capsule Take 20 mg by mouth daily.  04/16/14   [provider]  pantoprazole (PROTONIX) 40 MG tablet Take 40 mg by mouth daily. 11/30/16   [provider]  predniSONE (DELTASONE) 10 MG tablet Take 1 tablet (10 mg total) by mouth daily. 10/10/18   Braelen Sproule, Delorise RoyalsJonathan D, PA-C  terbinafine (LAMISIL) 250 MG tablet Take 250 mg by mouth daily. 08/11/16   [provider]  traMADol (ULTRAM) 50 MG tablet Take 1 tablet (50 mg total) by mouth every 6 (six) hours as needed. 09/30/18 09/30/19  Darci CurrentBrown, [place] N, MD    Allergies Codeine and Amoxicillin  No family history on file.  Social History Social History   Tobacco Use  . Smoking status: Former Smoker    Years: 20.00    Quit date: 01/05/2005    Years since quitting: 13.7  . Smokeless tobacco: Current User  Substance Use Topics  . Alcohol use: Yes    Alcohol/week: 0.0 standard drinks  . Drug use: No     Review of Systems  Constitutional: No fever/chills Eyes: No visual changes. No discharge ENT: No upper respiratory complaints. Cardiovascular: no chest pain. Respiratory: no cough. No SOB. Gastrointestinal: No abdominal pain.  No nausea, no vomiting.  No diarrhea.  No constipation. Genitourinary: Negative for dysuria. No hematuria Musculoskeletal: Low back pain with pain radiating down the right leg Skin: Negative for rash, abrasions, lacerations, ecchymosis. Neurological: Negative for headaches, focal weakness or numbness. 10-point ROS otherwise negative.  ____________________________________________   PHYSICAL EXAM:  VITAL SIGNS: ED Triage Vitals  Enc Vitals Group     BP 10/10/18 2058 131/81     Pulse Rate 10/10/18 2058 (!) 114      Resp 10/10/18 2058 18     Temp 10/10/18 2058 98.1 F (36.7 C)     Temp Source 10/10/18 2058 Oral     SpO2 10/10/18 2058 98 %     Weight 10/10/18 2059 165 lb (74.8 kg)     Height 10/10/18 2059 5\' 9"  (1.753 m)     Head Circumference --      Peak Flow --      Pain Score 10/10/18 2106 10     Pain Loc --      Pain Edu? --      Excl. in GC? --      Constitutional: Alert and oriented. Well appearing and in no acute distress. Eyes: Conjunctivae are normal. PERRL. EOMI. Head: Atraumatic. ENT:      Ears:       Nose: No congestion/rhinnorhea.      Mouth/Throat: Mucous membranes are moist.  Neck: No stridor.  No cervical spine tenderness to palpation.  Cardiovascular: Normal rate, regular rhythm. Normal S1 and S2.  Good peripheral circulation. Respiratory: Normal respiratory effort without tachypnea or retractions. Lungs CTAB. Good air entry to the bases with no decreased or absent breath sounds. Gastrointestinal: Bowel sounds 4 quadrants. Soft and nontender to palpation. No guarding or rigidity. No palpable masses. No distention. No CVA tenderness. Musculoskeletal: Full range of motion to all extremities. No gross deformities appreciated.  No visible abnormality about the lumbar spine.  Patient has would best be described as stiffness with flexion, extension and rotation of the lumbar spine.  With palpation, patient reports diffuse tenderness throughout the lumbar spine both midline and bilateral paraspinal muscle regions.  Slight increase in tenderness on the right when compared with left.  Tenderness to palpation of the right-sided sciatic notch.  None over the left.  Patient with positive straight leg raise bilaterally.  Dorsalis pedis pulse intact bilateral lower extremities.  Sensation intact bilateral lower extremities. Neurologic:  Normal speech and language. No gross focal neurologic deficits are appreciated.  Skin:  Skin is warm, dry and intact. No rash noted. Psychiatric: Mood and  affect are normal. Speech and behavior are normal. Patient exhibits appropriate insight and judgement.   ____________________________________________   LABS (all labs ordered are listed, but only abnormal results are displayed)  Labs Reviewed - No data to display ____________________________________________  EKG   ____________________________________________  RADIOLOGY I personally viewed and evaluated these images as part of my medical decision making, as well as reviewing the written report by the radiologist.  Ct Lumbar Spine Wo Contrast  Result Date: 10/10/2018 CLINICAL DATA:  Worsening back pain and sciatica EXAM: CT LUMBAR SPINE WITHOUT CONTRAST TECHNIQUE: Multidetector CT imaging of the lumbar spine was performed without intravenous contrast administration. Multiplanar CT image reconstructions were also generated. COMPARISON:  None. FINDINGS: Segmentation: There are 5 non-rib bearing lumbar type vertebral bodies with the last intervertebral disc space labeled as L5-S1. Alignment: Normal Vertebrae: There a left-sided L5 chronic pars defect. The vertebral body heights are well maintained. No fracture, malalignment, or pathologic osseous lesions seen. Paraspinal and other soft tissues: The paraspinal soft tissues and visualized retroperitoneal structures are unremarkable. The sacroiliac joints are intact. Disc levels: At L5-S1 there is a broad-based disc bulge and mild bilateral neural foraminal narrowing. IMPRESSION: Left-sided unilateral L5 chronic pars defect. Lumbar spine spondylosis most notable at L5-S1 with mild bilateral neural foraminal narrowing. Electronically Signed   By: Prudencio Pair M.D.   On: 10/10/2018 23:49    ____________________________________________    PROCEDURES  Procedure(s) performed:    Procedures    Medications - No data to display   ____________________________________________   INITIAL IMPRESSION / ASSESSMENT AND PLAN / ED  COURSE  Pertinent labs & imaging results that were available during my care of the patient were reviewed by me and considered in my medical decision making (see chart for details).  Review of the [place] CSRS was performed in accordance of the [place] prior to dispensing any controlled drugs.  Clinical Course as of Oct 11 10  Mon Oct 10, 2018  2341 Patient initially presents requesting admission so that he could be sedated for an MRI tomorrow.  Patient states that his neurosurgeon has requested that he perform an MRI of his lumbar spine, however patient is extremely claustrophobic and would require sedation for MRI.  Patient states that he continues to put off the MRI but he is requesting that we do it at this time as he continues to have pain.  Patient states that he would like to be admitted so  that we could sedate him in the morning to have his MRI.  We have discussed that this is unfortunately unfeasible.  At this time, I have discussed the difference between CT scan and MRI, however I will perform the CT as patient can tolerate CT scan but not the MRI without sedation.  Patient does not have any concerning neuro symptoms such as sudden change or increase in pain, saddle anesthesia, bowel or bladder dysfunction, paresthesias.  We will evaluate with CT scan, pending results determine whether patient will be suitable for discharge with medication and follow-up with his neurosurgeon, or if patient does have concerning findings for neurologic/spinal cord impingement, will consider either admission or transfer to his neurosurgeon at [facility].  Differential includes acute on chronic pain, herniated disc, pinched nerve, lumbar strain, compression fracture, cauda equina syndrome, central cord impingement, foraminal outlet stenosis.   [JC]    Clinical Course User Index [JC] Danely Bayliss, Delorise Royals, PA-C          [name] diagnosis is consistent with sciatica.  Patient presented to the emergency department  requesting an MRI.  Patient has had ongoing chronic back pain.  Patient fell and was originally evaluated in this emergency department.  Patient states that he did not want to pursue an MRI at that time.  Patient has had no concerning neuro symptoms however is requesting an MRI and states that he is extremely claustrophobic and required sedation.  At this time, patient is not having any concerning neurologic deficits or significant change in chronic pain.  I advised the patient that I would perform a CT scan to evaluate the area but sedation or admission for MRI at this time was not feasible.  Patient may present to [facility] for his outpatient MRI with sedation as ordered by his neurosurgeon.  CT revealed no concerning findings warranting further emergent evaluation with MRI or neurosurgery at this time.  Patient does have a history of drug-seeking behavior, however he has had 1 narcotic prescription in the last 2 years.  At this time patient will be given a prescription for Vicodin, Robaxin, prednisone taper.  Follow-up with neurosurgery..  Patient is given ED precautions to return to the ED for any worsening or new symptoms.     ____________________________________________  FINAL CLINICAL IMPRESSION(S) / ED DIAGNOSES  Final diagnoses:  Chronic midline low back pain with right-sided sciatica      NEW MEDICATIONS STARTED DURING THIS VISIT:  ED Discharge Orders         Ordered    HYDROcodone-acetaminophen (NORCO/VICODIN) 5-325 MG tablet  Every 4 hours PRN     10/11/18 0009    predniSONE (DELTASONE) 10 MG tablet  Daily    Note to Pharmacy: Take 6 pills x 2 days, 5 pills x 2 days, 4 pills x 2 days, 3 pills x 2 days, 2 pills x 2 days, and 1 pill x 2 days   10/11/18 0007    methocarbamol (ROBAXIN) 500 MG tablet  4 times daily     10/11/18 0007    HYDROcodone-acetaminophen (NORCO/VICODIN) 5-325 MG tablet  Every 4 hours PRN,   Status:  Discontinued     10/11/18 0007               This chart was dictated using voice recognition software/Dragon. Despite best efforts to proofread, errors can occur which can change the meaning. Any change was purely unintentional.    Racheal Patches, PA-C 10/11/18 0012    Miguel Aschoff., MD  10/11/18 1403  

## 2018-10-10 NOTE — ED Notes (Signed)
Pt ambulatory from triage, limping on the way back to the room, reports pain in right leg and lower back pain. A&Ox4, NAD

## 2018-10-11 ENCOUNTER — Other Ambulatory Visit: Payer: Self-pay

## 2018-10-11 ENCOUNTER — Encounter: Payer: Self-pay | Admitting: Emergency Medicine

## 2018-10-11 DIAGNOSIS — M546 Pain in thoracic spine: Secondary | ICD-10-CM | POA: Insufficient documentation

## 2018-10-11 DIAGNOSIS — G8929 Other chronic pain: Secondary | ICD-10-CM | POA: Insufficient documentation

## 2018-10-11 DIAGNOSIS — Z87891 Personal history of nicotine dependence: Secondary | ICD-10-CM | POA: Insufficient documentation

## 2018-10-11 DIAGNOSIS — W19XXXD Unspecified fall, subsequent encounter: Secondary | ICD-10-CM | POA: Insufficient documentation

## 2018-10-11 DIAGNOSIS — Z79899 Other long term (current) drug therapy: Secondary | ICD-10-CM | POA: Insufficient documentation

## 2018-10-11 MED ORDER — HYDROCODONE-ACETAMINOPHEN 5-325 MG PO TABS: 1.0000 | ORAL_TABLET | ORAL | 0 refills | Status: DC | PRN

## 2018-10-11 MED ORDER — METHOCARBAMOL 500 MG PO TABS: 500.0000 mg | ORAL_TABLET | Freq: Four times a day (QID) | ORAL | 0 refills | Status: DC

## 2018-10-11 MED ORDER — PREDNISONE 10 MG PO TABS: 10.0000 mg | ORAL_TABLET | Freq: Every day | ORAL | 0 refills | Status: DC

## 2018-10-11 NOTE — ED Triage Notes (Signed)
Patient ambulatory to triage with steady gait, without difficulty or distress noted; pt reports slipping and fall this evening injuring lower back; st he has "pinched nerves in spine" ; hx chronic back pain

## 2018-10-12 ENCOUNTER — Emergency Department: Payer: Medicaid Other

## 2018-10-12 ENCOUNTER — Emergency Department
Admission: EM | Admit: 2018-10-12 | Discharge: 2018-10-12 | Disposition: A | Discharge status: Home / Self Care | Payer: Medicaid Other | Source: Home / Self Care | Attending: Emergency Medicine | Admitting: Emergency Medicine

## 2018-10-12 DIAGNOSIS — G8929 Other chronic pain: Secondary | ICD-10-CM

## 2018-10-12 MED ORDER — HYDROCODONE-ACETAMINOPHEN 5-325 MG PO TABS: 1.0000 | ORAL_TABLET | ORAL | 0 refills | Status: DC | PRN

## 2018-10-12 MED ORDER — TRAMADOL HCL 50 MG PO TABS
50.0000 mg | ORAL_TABLET | Freq: Once | ORAL | Status: DC
  Filled 2018-10-12: qty 1

## 2018-10-12 MED ORDER — ACETAMINOPHEN 500 MG PO TABS
1000.0000 mg | ORAL_TABLET | Freq: Once | ORAL | Status: DC
  Filled 2018-10-12: qty 2

## 2018-10-12 NOTE — ED Notes (Signed)
Pt reports "tweaking" his back just PTA. Pt states he started to "slip and fall" earlier tonight, but caught himself before actually falling and aggravated his chronic back pain. Pt denies any loss of bladder or bowel, no other c/o of pain or injury reported.

## 2018-10-12 NOTE — ED Provider Notes (Signed)
[facility] Emergency Department Provider Note  ____________________________________________  Time seen: Approximately 1:24 AM  I have reviewed the triage vital signs and the nursing notes.   HISTORY  Chief Complaint Back Pain   HPI Shane [name] is a 58 y.o. male history of drug-seeking behavior and chronic back pain who presents for exacerbation of his back pain.   Patient seen here 2 nights ago for a fall and exacerbation of his chronic back pain.  He reports that today his legs gave out but he was able to hold himself and did not fall.  He is complaining of worsening thoracic pain that he describes as throbbing, midline, and constant.  He was given a prescription for Vicodin 2 nights ago but has run out of it.  He denies saddle anesthesia, lower extremity weakness or numbness, urinary or bowel incontinence or retention.  Past Medical History:  Diagnosis Date  . Anemia   . Anxiety   . Drug-seeking behavior    hx of narcotics dependence, previously discharged from pain management clinic  . Encounter for blood transfusion 2005, 02/2014  . GERD (gastroesophageal reflux disease)   . Hemorrhoid   . Herniated cervical disc     Patient Active Problem List   Diagnosis Date Noted  . Hemorrhoid 05/09/2014  . Skin abnormality 05/09/2014    Past Surgical History:  Procedure Laterality Date  . CERVICAL FUSION    . COLONOSCOPY  2/16   Dr. Mechele Collin  . FRACTURE SURGERY Left    fractured femur  . prolapsed rectum surgery  04/30/1995   [facility], trans abdominal mesh repair per patient report  . SEPTOPLASTY      Prior to Admission medications   Medication Sig Start Date End Date Taking? Authorizing Provider  cyclobenzaprine (FLEXERIL) 10 MG tablet Take 1 tablet by mouth 2 (two) times daily as needed. 09/04/16   [provider]  docusate sodium (COLACE) 100 MG capsule Take 250 mg by mouth 2 (two) times daily.     [provider]   Ferrous Sulfate (IRON) 325 (65 FE) MG TABS Take 1 tablet by mouth daily.    [provider]  fluticasone (FLONASE) 50 MCG/ACT nasal spray Place 1 spray into both nostrils daily.  04/20/14   [provider]  gabapentin (NEURONTIN) 100 MG capsule Take 1 capsule by mouth 3 (three) times daily. 10/02/16   [provider]  glycerin adult 2 g suppository Place 1 suppository rectally as needed for constipation. 09/25/17   Arnaldo Natal, MD  HYDROcodone-acetaminophen (NORCO/VICODIN) 5-325 MG tablet Take 1 tablet by mouth every 4 (four) hours as needed for moderate pain. 10/12/18 10/12/19  Nita Sickle, MD  hydrocortisone (ANUSOL-HC) 25 MG suppository Place 1 suppository (25 mg total) rectally 2 (two) times daily. 05/22/14   Earline Mayotte, MD  hydrocortisone (PROCTOSOL HC) 2.5 % rectal cream Place 1 application rectally 2 (two) times daily. 02/27/17   Emily Filbert, MD  hydrocortisone 2.5 % cream Apply 1 application topically daily. 11/30/16   [provider]  hydrocortisone-pramoxine (PROCTOFOAM HC) rectal foam Place 1 applicator rectally 2 (two) times daily. 12/22/16   Loleta Rose, MD  hydrOXYzine (ATARAX/VISTARIL) 10 MG tablet Take 1 tablet (10 mg total) by mouth 3 (three) times daily as needed for itching. 04/12/16   Schaevitz, Myra Rude, MD  methocarbamol (ROBAXIN) 500 MG tablet Take 1 tablet (500 mg total) by mouth 4 (four) times daily. 10/10/18   Cuthriell, Delorise Royals, PA-C  methocarbamol (ROBAXIN) 750 MG tablet Take 750 mg by mouth at bedtime.  03/02/14   [provider]  omeprazole (PRILOSEC) 20 MG capsule Take 20 mg by mouth daily.  04/16/14   [provider]  pantoprazole (PROTONIX) 40 MG tablet Take 40 mg by mouth daily. 11/30/16   [provider]  predniSONE (DELTASONE) 10 MG tablet Take 1 tablet (10 mg total) by mouth daily. 10/10/18   Cuthriell, Charline Bills, PA-C  terbinafine (LAMISIL) 250 MG tablet Take 250 mg by mouth  daily. 08/11/16   [provider]  traMADol (ULTRAM) 50 MG tablet Take 1 tablet (50 mg total) by mouth every 6 (six) hours as needed. 09/30/18 09/30/19  Gregor Hams, MD    Allergies Codeine and Amoxicillin  No family history on file.  Social History Social History   Tobacco Use  . Smoking status: Former Smoker    Years: 20.00    Quit date: 01/05/2005    Years since quitting: 13.7  . Smokeless tobacco: Current User  Substance Use Topics  . Alcohol use: Yes    Alcohol/week: 0.0 standard drinks  . Drug use: No    Review of Systems  Constitutional: Negative for fever. Eyes: Negative for visual changes. ENT: Negative for sore throat. Neck: No neck pain  Cardiovascular: Negative for chest pain. Respiratory: Negative for shortness of breath. Gastrointestinal: Negative for abdominal pain, vomiting or diarrhea. Genitourinary: Negative for dysuria. Musculoskeletal: + upper back pain. Skin: Negative for rash. Neurological: Negative for headaches, weakness or numbness. Psych: No SI or HI  ____________________________________________   PHYSICAL EXAM:  VITAL SIGNS: ED Triage Vitals  Enc Vitals Group     BP 10/11/18 2231 122/70     Pulse Rate 10/11/18 2231 100     Resp 10/11/18 2231 20     Temp 10/11/18 2231 98 F (36.7 C)     Temp Source 10/11/18 2231 Oral     SpO2 10/11/18 2231 99 %     Weight 10/11/18 2229 162 lb (73.5 kg)     Height 10/11/18 2229 5\' 9"  (1.753 m)     Head Circumference --      Peak Flow --      Pain Score 10/11/18 2229 8     Pain Loc --      Pain Edu? --      Excl. in [place]? --     Constitutional: Alert and oriented. Well appearing and in no apparent distress. HEENT:      Head: Normocephalic and atraumatic.         Eyes: Conjunctivae are normal. Sclera is non-icteric.       Mouth/Throat: Mucous membranes are moist.       Neck: Supple with no signs of meningismus. Cardiovascular: Regular rate and rhythm. No murmurs, gallops, or rubs. 2+  symmetrical distal pulses are present in all extremities. No JVD. Respiratory: Normal respiratory effort. Lungs are clear to auscultation bilaterally. No wheezes, crackles, or rhonchi.  Gastrointestinal: Soft, non tender, and non distended with positive bowel sounds. No rebound or guarding. Musculoskeletal: No midline CT and L-spine tenderness  neurologic: Normal speech and language. Face is symmetric.  Intact strength and sensation x4.  2+ DTRs bilaterally Skin: Skin is warm, dry and intact. No rash noted. Psychiatric: Mood and affect are normal. Speech and behavior are normal.  ____________________________________________   LABS (all labs ordered are listed, but only abnormal results are displayed)  Labs Reviewed - No data to display ____________________________________________  EKG  none  ____________________________________________  RADIOLOGY  I have personally reviewed the images performed during this visit and I agree with the Radiologist's read.   Interpretation by Radiologist:  Dg Thoracic Spine 2 View  Result Date: 10/12/2018 CLINICAL DATA:  Back pain EXAM: THORACIC SPINE 2 VIEWS COMPARISON:  None. FINDINGS: There is no evidence of thoracic spine fracture. Alignment is normal. No other significant bone abnormalities are identified. IMPRESSION: Negative. Electronically Signed   By: Deatra RobinsonKevin  Herman M.D.   On: 10/12/2018 01:55      ____________________________________________   PROCEDURES  Procedure(s) performed: None Procedures Critical Care performed:  None ____________________________________________   INITIAL IMPRESSION / ASSESSMENT AND PLAN / ED COURSE  58 y.o. male history of drug-seeking behavior and chronic back pain who presents for exacerbation of his back pain.   No signs or symptoms of cauda equina.  No history of IV drug use, unintentional weight loss or fever.  No history of cancer.  Exam is benign.  Patient provided with a prescription for norco 2 days  ago. Patient has a neurosurgeon who he follows for his back pain. XR showing no acute findings. Explained to the patient that we are unable to keep providing patient with prescriptions for narcotics for chronic pain and that he needs to follow-up with his doctor. Review of [place] controlled substance database only shows one prescription filled in 2020 for narcotics. I will give him enough for tomorrow but told him we will not be able to give him any further narcotics for his back pain.  Discussed my standard return precautions for new or worsening pain, unintentional weight loss, weakness or numbness of his extremities, saddle anesthesia, urinary or bowel incontinence or retention.  Otherwise he will follow-up with his neurosurgeon.       As part of my medical decision making, I reviewed the following data within the electronic MEDICAL RECORD NUMBER Nursing notes reviewed and incorporated, Old chart reviewed, Radiograph reviewed , Notes from prior ED visits and [place] Controlled Substance Database   Patient was evaluated in Emergency Department today for the symptoms described in the history of present illness. Patient was evaluated in the context of the global COVID-19 pandemic, which necessitated consideration that the patient might be at risk for infection with the SARS-CoV-2 virus that causes COVID-19. Institutional protocols and algorithms that pertain to the evaluation of patients at risk for COVID-19 are in a state of rapid change based on information released by regulatory bodies including the CDC and federal and state organizations. These policies and algorithms were followed during the patient's care in the ED.   ____________________________________________   FINAL CLINICAL IMPRESSION(S) / ED DIAGNOSES   Final diagnoses:  Chronic back pain, unspecified back location, unspecified back pain laterality      NEW MEDICATIONS STARTED DURING THIS VISIT:  ED Discharge Orders         Ordered     HYDROcodone-acetaminophen (NORCO/VICODIN) 5-325 MG tablet  Every 4 hours PRN     10/12/18 0202           Note:  This document was prepared using Dragon voice recognition software and may include unintentional dictation errors.    Nita SickleVeronese, [place], MD 10/12/18 [phone]

## 2018-10-13 DIAGNOSIS — Z5321 Procedure and treatment not carried out due to patient leaving prior to being seen by health care provider: Secondary | ICD-10-CM | POA: Diagnosis not present

## 2018-10-13 DIAGNOSIS — M549 Dorsalgia, unspecified: Secondary | ICD-10-CM | POA: Diagnosis present

## 2018-10-14 ENCOUNTER — Other Ambulatory Visit: Payer: Self-pay

## 2018-10-14 ENCOUNTER — Emergency Department
Admission: EM | Admit: 2018-10-14 | Discharge: 2018-10-14 | Disposition: A | Discharge status: Home / Self Care | Payer: Medicaid Other | Source: Home / Self Care | Attending: Emergency Medicine | Admitting: Emergency Medicine

## 2018-10-14 ENCOUNTER — Encounter: Payer: Self-pay | Admitting: Emergency Medicine

## 2018-10-14 ENCOUNTER — Emergency Department
Admission: EM | Admit: 2018-10-14 | Discharge: 2018-10-14 | Disposition: A | Discharge status: Home / Self Care | Payer: Medicaid Other | Attending: Emergency Medicine | Admitting: Emergency Medicine

## 2018-10-14 DIAGNOSIS — G8929 Other chronic pain: Secondary | ICD-10-CM | POA: Insufficient documentation

## 2018-10-14 DIAGNOSIS — M5441 Lumbago with sciatica, right side: Secondary | ICD-10-CM | POA: Insufficient documentation

## 2018-10-14 DIAGNOSIS — Z79899 Other long term (current) drug therapy: Secondary | ICD-10-CM | POA: Insufficient documentation

## 2018-10-14 DIAGNOSIS — Z87891 Personal history of nicotine dependence: Secondary | ICD-10-CM | POA: Insufficient documentation

## 2018-10-14 NOTE — Discharge Instructions (Signed)
You will need to call and schedule an appointment with primary care.  Take the medications that you have been prescribed.  If your symptoms change or worsen, return to the emergency department.

## 2018-10-14 NOTE — ED Notes (Signed)
Pt has chronic back pain. Pt is c/o back pain today. Pt drove self to ED. Pt has taken no meds today for his back pain. Pt states he received an RX for Vicodan from the ER and that's what he usually takens.

## 2018-10-14 NOTE — ED Triage Notes (Signed)
Patient reports back pain that radiates down right leg. Reports recent CT scan showed pinched nerve. Also has history of sciatica.

## 2018-10-14 NOTE — ED Provider Notes (Signed)
[facility] Emergency Department Provider Note ____________________________________________  Time seen: Approximately 9:52 PM  I have reviewed the triage vital signs and the nursing notes.  HISTORY  Chief Complaint Back Pain   HPI Shane [name] is a 58 y.o. male who presents to the emergency department for pain control secondary to chronic back pain with right-sided sciatica.  Patient has been evaluated for this several times over the past few days.  Patient states that he has been unable to keep appointments with his neurosurgeon due to lack of transportation and that his neurosurgeon dismissed him for missed appointments.  He states that he now has a vehicle, but does not have a primary care doctor or neurosurgeon which is why he keeps coming back to the emergency department.  Patient denies any new symptoms since his last visit here.  Patient states that he is taking Robaxin and prednisone as prescribed.  Patient states that at his last visit he was given Vicodin, but is now out and needs more.  Past Medical History:  Diagnosis Date  . Anemia   . Anxiety   . Drug-seeking behavior    hx of narcotics dependence, previously discharged from pain management clinic  . Encounter for blood transfusion 2005, 02/2014  . GERD (gastroesophageal reflux disease)   . Hemorrhoid   . Herniated cervical disc     Patient Active Problem List   Diagnosis Date Noted  . Hemorrhoid 05/09/2014  . Skin abnormality 05/09/2014    Past Surgical History:  Procedure Laterality Date  . CERVICAL FUSION    . COLONOSCOPY  2/16   Dr. Vira Agar  . FRACTURE SURGERY Left    fractured femur  . prolapsed rectum surgery  04/30/1995   [facility], trans abdominal mesh repair per patient report  . SEPTOPLASTY      Prior to Admission medications   Medication Sig Start Date End Date Taking? Authorizing Provider  cyclobenzaprine (FLEXERIL) 10 MG tablet Take 1 tablet by mouth 2 (two) times  daily as needed. 09/04/16   [provider]  docusate sodium (COLACE) 100 MG capsule Take 250 mg by mouth 2 (two) times daily.     [provider]  Ferrous Sulfate (IRON) 325 (65 FE) MG TABS Take 1 tablet by mouth daily.    [provider]  fluticasone (FLONASE) 50 MCG/ACT nasal spray Place 1 spray into both nostrils daily.  04/20/14   [provider]  gabapentin (NEURONTIN) 100 MG capsule Take 1 capsule by mouth 3 (three) times daily. 10/02/16   [provider]  glycerin adult 2 g suppository Place 1 suppository rectally as needed for constipation. 09/25/17   Nena Polio, MD  HYDROcodone-acetaminophen (NORCO/VICODIN) 5-325 MG tablet Take 1 tablet by mouth every 4 (four) hours as needed for moderate pain. 10/12/18 10/12/19  Rudene Re, MD  hydrocortisone (ANUSOL-HC) 25 MG suppository Place 1 suppository (25 mg total) rectally 2 (two) times daily. 05/22/14   Robert Bellow, MD  hydrocortisone (PROCTOSOL HC) 2.5 % rectal cream Place 1 application rectally 2 (two) times daily. 02/27/17   Earleen Newport, MD  hydrocortisone 2.5 % cream Apply 1 application topically daily. 11/30/16   [provider]  hydrocortisone-pramoxine (PROCTOFOAM HC) rectal foam Place 1 applicator rectally 2 (two) times daily. 12/22/16   Hinda Kehr, MD  hydrOXYzine (ATARAX/VISTARIL) 10 MG tablet Take 1 tablet (10 mg total) by mouth 3 (three) times daily as needed for itching. 04/12/16   Schaevitz, Randall An, MD  methocarbamol (ROBAXIN) 500 MG tablet Take 1 tablet (500 mg total) by mouth 4 (four) times daily. 10/10/18   Cuthriell, Delorise RoyalsJonathan D, PA-C  methocarbamol (ROBAXIN) 750 MG tablet Take 750 mg by mouth at bedtime.  03/02/14   [provider]  omeprazole (PRILOSEC) 20 MG capsule Take 20 mg by mouth daily.  04/16/14   [provider]  pantoprazole (PROTONIX) 40 MG tablet Take 40 mg by mouth daily. 11/30/16   [provider]  predniSONE  (DELTASONE) 10 MG tablet Take 1 tablet (10 mg total) by mouth daily. 10/10/18   Cuthriell, Delorise RoyalsJonathan D, PA-C  terbinafine (LAMISIL) 250 MG tablet Take 250 mg by mouth daily. 08/11/16   [provider]  traMADol (ULTRAM) 50 MG tablet Take 1 tablet (50 mg total) by mouth every 6 (six) hours as needed. 09/30/18 09/30/19  Darci CurrentBrown, [place] N, MD    Allergies Codeine and Amoxicillin  No family history on file.  Social History Social History   Tobacco Use  . Smoking status: Former Smoker    Years: 20.00    Quit date: 01/05/2005    Years since quitting: 13.7  . Smokeless tobacco: Current User  Substance Use Topics  . Alcohol use: Yes    Alcohol/week: 0.0 standard drinks  . Drug use: No    Review of Systems Constitutional: Well appearing. Respiratory: Negative for dyspnea. Cardiovascular: Negative for change in skin temperature or color. Musculoskeletal:   Negative for chronic steroid use   Negative for trauma in the presence of osteoporosis  Negative for age over 5850 and trauma.  Negative for constitutional symptoms, or history of cancer   Negative for pain worse at night. Skin: Negative for rash, lesion, or wound.  Genitourinary: Negative for urinary retention. Rectal: Negative for fecal incontinence or new onset constipation/bowel habit changes. Hematological/Immunilogical: Negative for immunosuppression, IV drug use, or fever Neurological: Positive for burning, tingling, numb, electric, radiating pain in the right lower extremity.                        Negative for saddle anesthesia.                        Negative for focal neurologic deficit, progressive or disabling symptoms             Negative for saddle anesthesia. ____________________________________________   PHYSICAL EXAM:  VITAL SIGNS: ED Triage Vitals  Enc Vitals Group     BP 10/14/18 1719 101/68     Pulse Rate 10/14/18 1719 88     Resp 10/14/18 1719 18     Temp 10/14/18 1719 98.3 F (36.8 C)     Temp  Source 10/14/18 1719 Oral     SpO2 10/14/18 1719 97 %     Weight 10/14/18 1720 162 lb (73.5 kg)     Height 10/14/18 1720 5\' 9"  (1.753 m)     Head Circumference --      Peak Flow --      Pain Score 10/14/18 1720 10     Pain Loc --      Pain Edu? --      Excl. in [place]? --     Constitutional: Alert and oriented. Well appearing and in no acute distress. Eyes: Conjunctivae are clear without discharge or drainage.  Head: Atraumatic. Neck: Full, active range of motion. Respiratory: Respirations even and unlabored. Musculoskeletal: Limited ROM of the back and extremities, Strength 5/5 of the lower  extremities as tested. Neurologic: Motor and sensory function is intact. Skin: Atraumatic.  Psychiatric: Behavior and affect are normal.  ____________________________________________   LABS (all labs ordered are listed, but only abnormal results are displayed)  Labs Reviewed - No data to display ____________________________________________  RADIOLOGY  Not indicated ____________________________________________   PROCEDURES  Procedure(s) performed:  Procedures ____________________________________________   INITIAL IMPRESSION / ASSESSMENT AND PLAN / ED COURSE  Shane [name] is a 58 y.o. male presenting to the emergency department for pain control.  [name] exam is overall reassuring.  He does not have any red flags.  He was observed ambulating without assistance.  Patient was advised that the emergency department does not provide chronic pain management and that he will need to see a primary care provider/pain management provider/neurosurgeon for further evaluation of his pain.  I did offer to refill or change the type of muscle relaxer that he is taking but he declined.  Patient states that he is on day 3 or 4 of the prednisone and was advised to continue taking that.  He was also advised that he may take Tylenol in addition to the above medications.  Patient ambulated out of the  department without assistance.  Medications - No data to display  ED Discharge Orders    None       Pertinent labs & imaging results that were available during my care of the patient were reviewed by me and considered in my medical decision making (see chart for details).  _________________________________________   FINAL CLINICAL IMPRESSION(S) / ED DIAGNOSES  Final diagnoses:  Chronic right-sided low back pain with right-sided sciatica     If controlled substance prescribed during this visit, 12 month history viewed on the [facility] prior to issuing an initial prescription for Schedule II or III opiod.   Chinita Pester, FNP 10/14/18 2158    Dionne Bucy, MD 10/14/18 [phone]

## 2018-10-14 NOTE — ED Triage Notes (Signed)
Pt in with co back pain that radiates down posterior right leg. States had CT scan done recently here at showed he had a pinched nerve. Here for persistent pain, hx of sciatica in the past.

## 2018-10-14 NOTE — ED Notes (Signed)
St leaving now due to long wait and "will come back later"

## 2018-10-14 NOTE — ED Provider Notes (Signed)
Formatting of this note is different from the original.  [facility]  Emergency Department Provider Note  ____________________________________________    Time seen: Approximately 9:52 PM    I have reviewed the triage vital signs and the nursing notes.    HISTORY    Chief Complaint  Back Pain    HPI  Jeffrey Wright is a 58 y.o. male who presents to the emergency department for pain control secondary to chronic back pain with right-sided sciatica.  Patient has been evaluated for this several times over the past few days.  Patient states that he has been unable to keep appointments with his neurosurgeon due to lack of transportation and that his neurosurgeon dismissed him for missed appointments.  He states that he now has a vehicle, but does not have a primary care doctor or neurosurgeon which is why he keeps coming back to the emergency department.  Patient denies any new symptoms since his last visit here.  Patient states that he is taking Robaxin and prednisone as prescribed.  Patient states that at his last visit he was given Vicodin, but is now out and needs more.    Past Medical History:   Diagnosis Date   ? Anemia    ? Anxiety    ? Drug-seeking behavior     hx of narcotics dependence, previously discharged from pain management clinic   ? Encounter for blood transfusion 2005, 02/2014   ? GERD (gastroesophageal reflux disease)    ? Hemorrhoid    ? Herniated cervical disc      Patient Active Problem List    Diagnosis Date Noted   ? Hemorrhoid 05/09/2014   ? Skin abnormality 05/09/2014     Past Surgical History:   Procedure Laterality Date   ? CERVICAL FUSION     ? COLONOSCOPY  2/16    Dr. Mechele CollinElliott   ? FRACTURE SURGERY Left     fractured femur   ? prolapsed rectum surgery  04/30/1995    [facility], trans abdominal mesh repair per patient report   ? SEPTOPLASTY       Prior to Admission medications    Medication Sig Start Date End Date Taking? Authorizing Provider   cyclobenzaprine (FLEXERIL)  10 MG tablet Take 1 tablet by mouth 2 (two) times daily as needed. 09/04/16   [provider]   docusate sodium (COLACE) 100 MG capsule Take 250 mg by mouth 2 (two) times daily.     [provider]   Ferrous Sulfate (IRON) 325 (65 FE) MG TABS Take 1 tablet by mouth daily.    [provider]   fluticasone (FLONASE) 50 MCG/ACT nasal spray Place 1 spray into both nostrils daily.  04/20/14   [provider]   gabapentin (NEURONTIN) 100 MG capsule Take 1 capsule by mouth 3 (three) times daily. 10/02/16   [provider]   glycerin adult 2 g suppository Place 1 suppository rectally as needed for constipation. 09/25/17   Arnaldo NatalMalinda, Paul F, MD   HYDROcodone-acetaminophen (NORCO/VICODIN) 5-325 MG tablet Take 1 tablet by mouth every 4 (four) hours as needed for moderate pain. 10/12/18 10/12/19  Nita SickleVeronese, Carolina, MD   hydrocortisone (ANUSOL-HC) 25 MG suppository Place 1 suppository (25 mg total) rectally 2 (two) times daily. 05/22/14   Earline MayotteByrnett, Jeffrey W, MD   hydrocortisone (PROCTOSOL HC) 2.5 % rectal cream Place 1 application rectally 2 (two) times daily. 02/27/17   Emily FilbertWilliams, Jonathan E, MD   hydrocortisone 2.5 % cream Apply  1 application topically daily. 11/30/16   [provider]   hydrocortisone-pramoxine (PROCTOFOAM HC) rectal foam Place 1 applicator rectally 2 (two) times daily. 12/22/16   Hinda Kehr, MD   hydrOXYzine (ATARAX/VISTARIL) 10 MG tablet Take 1 tablet (10 mg total) by mouth 3 (three) times daily as needed for itching. 04/12/16   Schaevitz, Randall An, MD   methocarbamol (ROBAXIN) 500 MG tablet Take 1 tablet (500 mg total) by mouth 4 (four) times daily. 10/10/18   Cuthriell, Charline Bills, PA-C   methocarbamol (ROBAXIN) 750 MG tablet Take 750 mg by mouth at bedtime.  03/02/14   [provider]   omeprazole (PRILOSEC) 20 MG capsule Take 20 mg by mouth daily.  04/16/14   [provider]   pantoprazole (PROTONIX) 40 MG tablet Take 40 mg by mouth daily.  11/30/16   [provider]   predniSONE (DELTASONE) 10 MG tablet Take 1 tablet (10 mg total) by mouth daily. 10/10/18   Cuthriell, Charline Bills, PA-C   terbinafine (LAMISIL) 250 MG tablet Take 250 mg by mouth daily. 08/11/16   [provider]   traMADol (ULTRAM) 50 MG tablet Take 1 tablet (50 mg total) by mouth every 6 (six) hours as needed. 09/30/18 09/30/19  Gregor Hams, MD     Allergies  Codeine and Amoxicillin    No family history on file.    Social History  Social History     Tobacco Use   ? Smoking status: Former Smoker     Years: 20.00     Quit date: 01/05/2005     Years since quitting: 13.7   ? Smokeless tobacco: Current User   Substance Use Topics   ? Alcohol use: Yes     Alcohol/week: 0.0 standard drinks   ? Drug use: No     Review of Systems  Constitutional: Well appearing.  Respiratory: Negative for dyspnea.  Cardiovascular: Negative for change in skin temperature or color.  Musculoskeletal:    Negative for chronic steroid use    Negative for trauma in the presence of osteoporosis   Negative for age over 37 and trauma.   Negative for constitutional symptoms, or history of cancer    Negative for pain worse at night.  Skin: Negative for rash, lesion, or wound.   Genitourinary: Negative for urinary retention.  Rectal: Negative for fecal incontinence or new onset constipation/bowel habit changes.  Hematological/Immunilogical: Negative for immunosuppression, IV drug use, or fever  Neurological: Positive for burning, tingling, numb, electric, radiating pain in the right lower extremity.                         Negative for saddle anesthesia.                         Negative for focal neurologic deficit, progressive or disabling symptoms              Negative for saddle anesthesia.  ____________________________________________    PHYSICAL EXAM:    VITAL SIGNS:  ED Triage Vitals   Enc Vitals Group      BP 10/14/18 1719 101/68      Pulse Rate 10/14/18 1719 88      Resp 10/14/18 1719 18      Temp  10/14/18 1719 98.3 F (36.8 C)      Temp Source 10/14/18 1719 Oral      SpO2 10/14/18 1719 97 %  Weight 10/14/18 1720 162 lb (73.5 kg)      Height 10/14/18 1720 5\' 9"  (1.753 m)      Head Circumference --       Peak Flow --       Pain Score 10/14/18 1720 10      Pain Loc --       Pain Edu? --       Excl. in [place]? --      Constitutional: Alert and oriented. Well appearing and in no acute distress.  Eyes: Conjunctivae are clear without discharge or drainage.   Head: Atraumatic.  Neck: Full, active range of motion.  Respiratory: Respirations even and unlabored.  Musculoskeletal: Limited ROM of the back and extremities, Strength 5/5 of the lower extremities as tested.  Neurologic: Motor and sensory function is intact.  Skin: Atraumatic.   Psychiatric: Behavior and affect are normal.    ____________________________________________    LABS  (all labs ordered are listed, but only abnormal results are displayed)    Labs Reviewed - No data to display  ____________________________________________    RADIOLOGY    Not indicated  ____________________________________________    PROCEDURES    Procedure(s) performed:   Procedures  ____________________________________________    INITIAL IMPRESSION / ASSESSMENT AND PLAN / ED COURSE    Jeffrey Wright is a 58 y.o. male presenting to the emergency department for pain control.  [name] exam is overall reassuring.  He does not have any red flags.  He was observed ambulating without assistance.  Patient was advised that the emergency department does not provide chronic pain management and that he will need to see a primary care provider/pain management provider/neurosurgeon for further evaluation of his pain.  I did offer to refill or change the type of muscle relaxer that he is taking but he declined.  Patient states that he is on day 3 or 4 of the prednisone and was advised to continue taking that.  He was also advised that he may take Tylenol in addition to the above medications.   Patient ambulated out of the department without assistance.    Medications - No data to display    ED Discharge Orders     None       Pertinent labs & imaging results that were available during my care of the patient were reviewed by me and considered in my medical decision making (see chart for details).    _________________________________________    FINAL CLINICAL IMPRESSION(S) / ED DIAGNOSES    Final diagnoses:   Chronic right-sided low back pain with right-sided sciatica     If controlled substance prescribed during this visit, 12 month history viewed on the [facility] prior to issuing an initial prescription for Schedule II or III opiod.    46, FNP  10/14/18 2158      2159, MD  10/14/18 2331    Electronically signed by [id number], MD at 10/14/2018 11:31 PM EDT    Associated attestation - 12/14/2018, MD - 10/14/2018 11:31 PM EDT  Formatting of this note might be different from the original.  Medical screening examination/treatment/procedure(s) were performed by non-physician practitioner and as supervising physician I was immediately available for consultation/collaboration.

## 2018-10-14 NOTE — ED Triage Notes (Signed)
Formatting of this note might be different from the original.  Patient reports back pain that radiates down right leg. Reports recent CT scan showed pinched nerve. Also has history of sciatica.   Electronically signed by Heywood Bene, RN at 10/14/2018  5:19 PM EDT

## 2018-10-14 NOTE — ED Notes (Signed)
Formatting of this note might be different from the original.  Pt has chronic back pain. Pt is c/o back pain today. Pt drove self to ED. Pt has taken no meds today for his back pain. Pt states he received an RX for Vicodan from the ER and that's what he usually takens.  Electronically signed by Leticia Clas, RN at 10/14/2018  5:37 PM EDT

## 2018-10-17 ENCOUNTER — Other Ambulatory Visit: Payer: Self-pay

## 2018-10-17 DIAGNOSIS — M5441 Lumbago with sciatica, right side: Secondary | ICD-10-CM | POA: Diagnosis not present

## 2018-10-17 DIAGNOSIS — Z87891 Personal history of nicotine dependence: Secondary | ICD-10-CM | POA: Insufficient documentation

## 2018-10-17 DIAGNOSIS — G8929 Other chronic pain: Secondary | ICD-10-CM | POA: Insufficient documentation

## 2018-10-17 DIAGNOSIS — Z79899 Other long term (current) drug therapy: Secondary | ICD-10-CM | POA: Insufficient documentation

## 2018-10-17 NOTE — ED Triage Notes (Signed)
Pt arrives to ED via POV from home with c/o "sciatic nerve pain". Pt reports h/x of same; no new injury or trauma. Pt denies following up with neuro as directed on recent visit.

## 2018-10-18 ENCOUNTER — Emergency Department
Admission: EM | Admit: 2018-10-18 | Discharge: 2018-10-18 | Disposition: A | Discharge status: Home / Self Care | Payer: Medicaid Other | Attending: Emergency Medicine | Admitting: Emergency Medicine

## 2018-10-18 DIAGNOSIS — G8929 Other chronic pain: Secondary | ICD-10-CM

## 2018-10-18 NOTE — Discharge Instructions (Signed)
We recommend doing MRI after getting ativan but you would prefer to follow up outpatient for it.  You should get this schedule as soon as possible.  Return to Ed for worsening pain, weakness, urinating on self or any other concerns.   Left-sided unilateral L5 chronic pars defect.   Lumbar spine spondylosis most notable at L5-S1 with mild bilateral neural foraminal narrowing.

## 2018-10-18 NOTE — ED Provider Notes (Signed)
[facility] Emergency Department Provider Note  ____________________________________________   First MD Initiated Contact with Patient 10/18/18 0025     (approximate)  I have reviewed the triage vital signs and the nursing notes.   HISTORY  Chief Complaint Back Pain    HPI Shane [name] is a 58 y.o. male with report of narcotic dependence and was discharged from the pain management clinic, back pain who presents with back pain.  Patient has been seen on 9/25, 10/5, 10/7 and 10/9 for back pain.  Patient has not had any MRI imaging done.  Patient was given some oxycodone on 10/7 and was given 8 and he returned 2 days later saying that he had run out of the pain medication.  Patient has not been following up with neurosurgery.  Is reported that patient would need full sedation in order to have MRI.  Patient did have a CT scan on 10/5 without any concerning findings at this time he was also given a prescription of Vicodin, prednisone, Robaxin.  According to patient he has been having this pain since 2017.  He had cervical neck surgery but no surgeries of his thoracic or lumbar area area.  The pain is intermittent shooting pain down his right leg that is severe, better with oxycodone and the prednisone, worse with walking, better at rest.  He was trying to get his MRI scheduled outpatient has not done it yet.  He denies any numbness in his rectum, IV drug use, history of cancer.  His pain is more on the right SI joint than the lumbar region.  Denies testicle pain.     Past Medical History:  Diagnosis Date  . Anemia   . Anxiety   . Drug-seeking behavior    hx of narcotics dependence, previously discharged from pain management clinic  . Encounter for blood transfusion 2005, 02/2014  . GERD (gastroesophageal reflux disease)   . Hemorrhoid   . Herniated cervical disc     Patient Active Problem List   Diagnosis Date Noted  . Hemorrhoid 05/09/2014  . Skin  abnormality 05/09/2014    Past Surgical History:  Procedure Laterality Date  . CERVICAL FUSION    . COLONOSCOPY  2/16   Dr. Mechele Collin  . FRACTURE SURGERY Left    fractured femur  . prolapsed rectum surgery  04/30/1995   [facility], trans abdominal mesh repair per patient report  . SEPTOPLASTY      Prior to Admission medications   Medication Sig Start Date End Date Taking? Authorizing Provider  cyclobenzaprine (FLEXERIL) 10 MG tablet Take 1 tablet by mouth 2 (two) times daily as needed. 09/04/16   [provider]  docusate sodium (COLACE) 100 MG capsule Take 250 mg by mouth 2 (two) times daily.     [provider]  Ferrous Sulfate (IRON) 325 (65 FE) MG TABS Take 1 tablet by mouth daily.    [provider]  fluticasone (FLONASE) 50 MCG/ACT nasal spray Place 1 spray into both nostrils daily.  04/20/14   [provider]  gabapentin (NEURONTIN) 100 MG capsule Take 1 capsule by mouth 3 (three) times daily. 10/02/16   [provider]  glycerin adult 2 g suppository Place 1 suppository rectally as needed for constipation. 09/25/17   Arnaldo Natal, MD  HYDROcodone-acetaminophen (NORCO/VICODIN) 5-325 MG tablet Take 1 tablet by mouth every 4 (four) hours as needed for moderate pain. 10/12/18 10/12/19  Nita Sickle, MD  hydrocortisone (ANUSOL-HC) 25 MG suppository Place  1 suppository (25 mg total) rectally 2 (two) times daily. 05/22/14   Robert Bellow, MD  hydrocortisone (PROCTOSOL HC) 2.5 % rectal cream Place 1 application rectally 2 (two) times daily. 02/27/17   Earleen Newport, MD  hydrocortisone 2.5 % cream Apply 1 application topically daily. 11/30/16   [provider]  hydrocortisone-pramoxine (PROCTOFOAM HC) rectal foam Place 1 applicator rectally 2 (two) times daily. 12/22/16   Hinda Kehr, MD  hydrOXYzine (ATARAX/VISTARIL) 10 MG tablet Take 1 tablet (10 mg total) by mouth 3 (three) times daily as needed for itching. 04/12/16    Schaevitz, Randall An, MD  methocarbamol (ROBAXIN) 500 MG tablet Take 1 tablet (500 mg total) by mouth 4 (four) times daily. 10/10/18   Cuthriell, Charline Bills, PA-C  methocarbamol (ROBAXIN) 750 MG tablet Take 750 mg by mouth at bedtime.  03/02/14   [provider]  omeprazole (PRILOSEC) 20 MG capsule Take 20 mg by mouth daily.  04/16/14   [provider]  pantoprazole (PROTONIX) 40 MG tablet Take 40 mg by mouth daily. 11/30/16   [provider]  predniSONE (DELTASONE) 10 MG tablet Take 1 tablet (10 mg total) by mouth daily. 10/10/18   Cuthriell, Charline Bills, PA-C  terbinafine (LAMISIL) 250 MG tablet Take 250 mg by mouth daily. 08/11/16   [provider]  traMADol (ULTRAM) 50 MG tablet Take 1 tablet (50 mg total) by mouth every 6 (six) hours as needed. 09/30/18 09/30/19  Gregor Hams, MD    Allergies Codeine and Amoxicillin  No family history on file.  Social History Social History   Tobacco Use  . Smoking status: Former Smoker    Years: 20.00    Quit date: 01/05/2005    Years since quitting: 13.7  . Smokeless tobacco: Current User  Substance Use Topics  . Alcohol use: Yes    Alcohol/week: 0.0 standard drinks  . Drug use: No      Review of Systems Constitutional: No fever/chills Eyes: No visual changes. ENT: No sore throat. Cardiovascular: Denies chest pain. Respiratory: Denies shortness of breath. Gastrointestinal: No abdominal pain.  No nausea, no vomiting.  No diarrhea.  No constipation. Genitourinary: Negative for dysuria. Musculoskeletal: Positive back pain Skin: Negative for rash. Neurological: Negative for headaches, focal weakness or numbness. All other ROS negative ____________________________________________   PHYSICAL EXAM:  VITAL SIGNS: ED Triage Vitals  Enc Vitals Group     BP 10/17/18 2139 132/71     Pulse Rate 10/17/18 2139 (!) 102     Resp 10/17/18 2139 17     Temp 10/17/18 2139 98.4 F (36.9 C)     Temp Source  10/17/18 2139 Oral     SpO2 10/17/18 2139 98 %     Weight 10/17/18 2138 162 lb (73.5 kg)     Height 10/17/18 2138 5\' 9"  (1.753 m)     Head Circumference --      Peak Flow --      Pain Score 10/17/18 2138 10     Pain Loc --      Pain Edu? --      Excl. in [place]? --     Constitutional: Alert and oriented. Well appearing and in no acute distress. Eyes: Conjunctivae are normal. EOMI. Head: Atraumatic. Nose: No congestion/rhinnorhea. Mouth/Throat: Mucous membranes are moist.   Neck: No stridor. Trachea Midline. FROM Cardiovascular: Normal rate, regular rhythm. Grossly normal heart sounds.  Good peripheral circulation. Respiratory: Normal respiratory effort.  No retractions. Lungs CTAB. Gastrointestinal: Soft and  nontender. No distention. No abdominal bruits.  Musculoskeletal: No lower extremity tenderness nor edema.  No joint effusions.  Strength 5 out of 5 bilaterally, sensation intact bilaterally.  Patient able to ambulate maybe a slight limp on the right side.  Able to dorsiflex and plantar flex.  No swelling or erythema of the joints very mild lumbar midline tenderness.  He is his tenderness seems to be more at his SI joint. Neurologic:  Normal speech and language. No gross focal neurologic deficits are appreciated.  Skin:  Skin is warm, dry and intact. No rash noted. Psychiatric: Mood and affect are normal. Speech and behavior are normal. GU: Deferred   ____________________________________________  PROCEDURES  Procedure(s) performed (including Critical Care):  Procedures   ____________________________________________   INITIAL IMPRESSION / ASSESSMENT AND PLAN / ED COURSE  Shane [name] was evaluated in Emergency Department on 10/18/2018 for the symptoms described in the history of present illness. He was evaluated in the context of the global COVID-19 pandemic, which necessitated consideration that the patient might be at risk for infection with the SARS-CoV-2 virus that causes  COVID-19. Institutional protocols and algorithms that pertain to the evaluation of patients at risk for COVID-19 are in a state of rapid change based on information released by regulatory bodies including the CDC and federal and state organizations. These policies and algorithms were followed during the patient's care in the ED.    Patient presents for his fifth visit in the past 2 to 3 weeks for right-sided sciatica.  Patient did have CT scan done recently which showed some mild bilateral neuroforaminal foraminal narrowing.  Patient has no red flag symptoms is able to ambulate.  His pain seems to be more situated over the SI joint than his lumbar region.  However given that he is been coming to the ER so frequently I did offer MRI with Ativan to rule out any acute process although my suspicion was low for cauda equina, epidural abscess.  No abdominal pain to suggest AAA.  Denies any testicle pain to suggest GU issue.  I offered MRI with Ativan sedation but patient declined saying that he is going to drive home tonight and he does not want to be sedated with Ativan for this.  He said he preferred to have it done with further procedural sedation in the MRI suite.  I explained to patient that usually they start off with something like Ativan and he expressed understanding but preferred to do outpatient.  I encouraged patient to take documents affect think about it and I would ask him again if he would want MRI.   Reevaluated patient and patient still declines MRI.  I explained the importance of getting this MRI done given his chronic pain and recurrent visits to the ER.  I explained that I would not feel comfortable giving more opioids given he has had multiple refills.  Patient was very understanding and declined additional nonopioid pain medication.  Patient said that he will try to schedule his MRI scheduled outpatient and follow-up with his neurosurgeon.  He understands return precautions.  He declined  Tylenol, Toradol or other nonopioid pain medications.  I discussed the provisional nature of ED diagnosis, the treatment so far, the ongoing plan of care, follow up appointments and return precautions with the patient and any family or support people present. They expressed understanding and agreed with the plan, discharged home.   ____________________________________________   FINAL CLINICAL IMPRESSION(S) / ED DIAGNOSES   Final diagnoses:  Chronic  right-sided low back pain with right-sided sciatica      MEDICATIONS GIVEN DURING THIS VISIT:  Medications - No data to display   ED Discharge Orders    None       Note:  This document was prepared using Dragon voice recognition software and may include unintentional dictation errors.   Concha Se, MD 10/18/18 [phone]

## 2018-10-21 ENCOUNTER — Other Ambulatory Visit: Payer: Self-pay

## 2018-10-21 DIAGNOSIS — Z79899 Other long term (current) drug therapy: Secondary | ICD-10-CM | POA: Diagnosis not present

## 2018-10-21 DIAGNOSIS — M5441 Lumbago with sciatica, right side: Secondary | ICD-10-CM | POA: Diagnosis not present

## 2018-10-21 DIAGNOSIS — Z87891 Personal history of nicotine dependence: Secondary | ICD-10-CM | POA: Diagnosis not present

## 2018-10-21 DIAGNOSIS — G8929 Other chronic pain: Secondary | ICD-10-CM | POA: Insufficient documentation

## 2018-10-21 DIAGNOSIS — M79604 Pain in right leg: Secondary | ICD-10-CM | POA: Diagnosis present

## 2018-10-21 NOTE — ED Notes (Signed)
Pt states is here for right leg pain. Pt declines offer for wheelchair. Pt ambulatory.

## 2018-10-21 NOTE — ED Triage Notes (Signed)
Pt complains of right leg pain "years" pt states tonight he is here because he ran out of his methocarbamol. Pt appears in no acute distress.

## 2018-10-22 ENCOUNTER — Emergency Department
Admission: EM | Admit: 2018-10-22 | Discharge: 2018-10-22 | Disposition: A | Discharge status: Home / Self Care | Payer: Medicaid Other | Attending: Emergency Medicine | Admitting: Emergency Medicine

## 2018-10-22 DIAGNOSIS — G8929 Other chronic pain: Secondary | ICD-10-CM

## 2018-10-22 DIAGNOSIS — M5441 Lumbago with sciatica, right side: Secondary | ICD-10-CM

## 2018-10-22 MED ORDER — METHOCARBAMOL 750 MG PO TABS
750.0000 mg | ORAL_TABLET | Freq: Once | ORAL | Status: AC
  Administered 2018-10-22: 750 mg via ORAL
  Filled 2018-10-22: qty 1

## 2018-10-22 MED ORDER — METHOCARBAMOL 750 MG PO TABS: 750.0000 mg | ORAL_TABLET | Freq: Three times a day (TID) | ORAL | 0 refills | Status: DC | PRN

## 2018-10-22 NOTE — ED Provider Notes (Signed)
[facility] Emergency Department Provider Note    First MD Initiated Contact with Patient 10/22/18 [id number]     (approximate)  I have reviewed the triage vital signs and the nursing notes.   HISTORY  Chief Complaint right leg pain  HPI Shane [name] is a 58 y.o. male with below list of previous medical conditions including narcotic dependence drug-seeking behavior and radiculopathy discharged from his pain management clinic presents to the emergency department secondary to low back pain with radiation down his right leg.  Patient was seen in this emergency department on 925, 10/5, 10/7, and 10/9 due to the same complaint.  Patient was referred to neurosurgery that he has not followed up with.  Patient presents tonight saying that he has 10 out of 10 pain.  Patient denies any urinary or bowel incontinence.  Patient states that he ran out of his medication       Past Medical History:  Diagnosis Date  . Anemia   . Anxiety   . Drug-seeking behavior    hx of narcotics dependence, previously discharged from pain management clinic  . Encounter for blood transfusion 2005, 02/2014  . GERD (gastroesophageal reflux disease)   . Hemorrhoid   . Herniated cervical disc     Patient Active Problem List   Diagnosis Date Noted  . Hemorrhoid 05/09/2014  . Skin abnormality 05/09/2014    Past Surgical History:  Procedure Laterality Date  . CERVICAL FUSION    . COLONOSCOPY  2/16   Dr. Mechele CollinElliott  . FRACTURE SURGERY Left    fractured femur  . prolapsed rectum surgery  04/30/1995   [facility], trans abdominal mesh repair per patient report  . SEPTOPLASTY      Prior to Admission medications   Medication Sig Start Date End Date Taking? Authorizing Provider  cyclobenzaprine (FLEXERIL) 10 MG tablet Take 1 tablet by mouth 2 (two) times daily as needed. 09/04/16   [provider]  docusate sodium (COLACE) 100 MG capsule Take 250 mg by mouth 2 (two) times daily.      [provider]  Ferrous Sulfate (IRON) 325 (65 FE) MG TABS Take 1 tablet by mouth daily.    [provider]  fluticasone (FLONASE) 50 MCG/ACT nasal spray Place 1 spray into both nostrils daily.  04/20/14   [provider]  gabapentin (NEURONTIN) 100 MG capsule Take 1 capsule by mouth 3 (three) times daily. 10/02/16   [provider]  glycerin adult 2 g suppository Place 1 suppository rectally as needed for constipation. 09/25/17   Arnaldo NatalMalinda, Paul F, MD  HYDROcodone-acetaminophen (NORCO/VICODIN) 5-325 MG tablet Take 1 tablet by mouth every 4 (four) hours as needed for moderate pain. 10/12/18 10/12/19  Nita SickleVeronese, [place], MD  hydrocortisone (ANUSOL-HC) 25 MG suppository Place 1 suppository (25 mg total) rectally 2 (two) times daily. 05/22/14   Earline MayotteByrnett, Jeffrey W, MD  hydrocortisone (PROCTOSOL HC) 2.5 % rectal cream Place 1 application rectally 2 (two) times daily. 02/27/17   Emily FilbertWilliams, Jonathan E, MD  hydrocortisone 2.5 % cream Apply 1 application topically daily. 11/30/16   [provider]  hydrocortisone-pramoxine (PROCTOFOAM HC) rectal foam Place 1 applicator rectally 2 (two) times daily. 12/22/16   Loleta RoseForbach, Cory, MD  hydrOXYzine (ATARAX/VISTARIL) 10 MG tablet Take 1 tablet (10 mg total) by mouth 3 (three) times daily as needed for itching. 04/12/16   Schaevitz, Myra Rudeavid Matthew, MD  methocarbamol (ROBAXIN) 500 MG tablet Take 1 tablet (500 mg total) by mouth 4 (four) times  daily. 10/10/18   Cuthriell, Charline Bills, PA-C  methocarbamol (ROBAXIN) 750 MG tablet Take 1 tablet (750 mg total) by mouth every 8 (eight) hours as needed for muscle spasms. 10/22/18   Gregor Hams, MD  omeprazole (PRILOSEC) 20 MG capsule Take 20 mg by mouth daily.  04/16/14   [provider]  pantoprazole (PROTONIX) 40 MG tablet Take 40 mg by mouth daily. 11/30/16   [provider]  predniSONE (DELTASONE) 10 MG tablet Take 1 tablet (10 mg total) by mouth daily. 10/10/18    Cuthriell, Charline Bills, PA-C  terbinafine (LAMISIL) 250 MG tablet Take 250 mg by mouth daily. 08/11/16   [provider]  traMADol (ULTRAM) 50 MG tablet Take 1 tablet (50 mg total) by mouth every 6 (six) hours as needed. 09/30/18 09/30/19  Gregor Hams, MD    Allergies Codeine and Amoxicillin  No family history on file.  Social History Social History   Tobacco Use  . Smoking status: Former Smoker    Years: 20.00    Quit date: 01/05/2005    Years since quitting: 13.8  . Smokeless tobacco: Current User  Substance Use Topics  . Alcohol use: Yes    Alcohol/week: 0.0 standard drinks  . Drug use: No    Review of Systems Constitutional: No fever/chills Eyes: No visual changes. ENT: No sore throat. Cardiovascular: Denies chest pain. Respiratory: Denies shortness of breath. Gastrointestinal: No abdominal pain.  No nausea, no vomiting.  No diarrhea.  No constipation. Genitourinary: Negative for dysuria. Musculoskeletal: Negative for neck pain.  Positive for for back pain. Integumentary: Negative for rash. Neurological: Negative for headaches, focal weakness or numbness.   ____________________________________________   PHYSICAL EXAM:  VITAL SIGNS: ED Triage Vitals [10/21/18 2309]  Enc Vitals Group     BP 116/78     Pulse Rate (!) 109     Resp 16     Temp 97.9 F (36.6 C)     Temp Source Oral     SpO2 100 %     Weight 72.6 kg (160 lb)     Height 1.753 m (5\' 9" )     Head Circumference      Peak Flow      Pain Score 10     Pain Loc      Pain Edu?      Excl. in [place]?     Constitutional: Alert and oriented.  Eyes: Conjunctivae are normal.  Head: Atraumatic. Mouth/Throat: Mucous membranes are moist. Neck: No stridor.  No meningeal signs.   Cardiovascular: Normal rate, regular rhythm. Good peripheral circulation. Grossly normal heart sounds. Respiratory: Normal respiratory effort.  No retractions. Gastrointestinal: Soft and nontender. No distention.   Musculoskeletal: No lower extremity tenderness nor edema. No gross deformities of extremities. Neurologic:  Normal speech and language. No gross focal neurologic deficits are appreciated.  Skin:  Skin is warm, dry and intact. Psychiatric: Mood and affect are normal. Speech and behavior are normal.   Procedures   ____________________________________________   INITIAL IMPRESSION / MDM / ASSESSMENT AND PLAN / ED COURSE  As part of my medical decision making, I reviewed the following data within the electronic MEDICAL RECORD NUMBER   58 year old male presented with above-stated history and physical exam secondary to low back pain.  Suspect patient's known history of radiculopathy to be the etiology of his pain.  Patient referred to neurosurgery again tonight.  Patient will be prescribed Robaxin 750 mg 3 times daily which he says he has been  taking for a long time  ____________________________________________  FINAL CLINICAL IMPRESSION(S) / ED DIAGNOSES  Final diagnoses:  Chronic right-sided low back pain with right-sided sciatica     MEDICATIONS GIVEN DURING THIS VISIT:  Medications  methocarbamol (ROBAXIN) tablet 750 mg (has no administration in time range)     ED Discharge Orders         Ordered    methocarbamol (ROBAXIN) 750 MG tablet  Every 8 hours PRN     10/22/18 0249          *Please note:  Shane [name] was evaluated in Emergency Department on 10/22/2018 for the symptoms described in the history of present illness. He was evaluated in the context of the global COVID-19 pandemic, which necessitated consideration that the patient might be at risk for infection with the SARS-CoV-2 virus that causes COVID-19. Institutional protocols and algorithms that pertain to the evaluation of patients at risk for COVID-19 are in a state of rapid change based on information released by regulatory bodies including the CDC and federal and state organizations. These policies and algorithms  were followed during the patient's care in the ED.  Some ED evaluations and interventions may be delayed as a result of limited staffing during the pandemic.*  Note:  This document was prepared using Dragon voice recognition software and may include unintentional dictation errors.   Darci Current, MD 10/22/18 [phone]

## 2018-10-23 ENCOUNTER — Other Ambulatory Visit: Payer: Self-pay

## 2018-10-23 DIAGNOSIS — M5441 Lumbago with sciatica, right side: Secondary | ICD-10-CM | POA: Insufficient documentation

## 2018-10-23 DIAGNOSIS — Z79899 Other long term (current) drug therapy: Secondary | ICD-10-CM | POA: Insufficient documentation

## 2018-10-23 DIAGNOSIS — G8929 Other chronic pain: Secondary | ICD-10-CM | POA: Insufficient documentation

## 2018-10-23 DIAGNOSIS — R200 Anesthesia of skin: Secondary | ICD-10-CM | POA: Diagnosis present

## 2018-10-23 DIAGNOSIS — F17200 Nicotine dependence, unspecified, uncomplicated: Secondary | ICD-10-CM | POA: Insufficient documentation

## 2018-10-23 NOTE — ED Triage Notes (Signed)
Patient reports that right leg is numb and that's because of a pinched nerve.  Patient reports "cramping" to both calves.

## 2018-10-24 ENCOUNTER — Emergency Department
Admission: EM | Admit: 2018-10-24 | Discharge: 2018-10-24 | Disposition: A | Discharge status: Home / Self Care | Payer: Medicaid Other | Attending: Emergency Medicine | Admitting: Emergency Medicine

## 2018-10-24 DIAGNOSIS — G8929 Other chronic pain: Secondary | ICD-10-CM

## 2018-10-24 NOTE — ED Notes (Signed)
Patient refusing to wait to be discharged, walked out of room.

## 2018-10-24 NOTE — ED Provider Notes (Signed)
[facility] Emergency Department Provider Note  ____________________________________________  Time seen: Approximately 3:36 AM  I have reviewed the triage vital signs and the nursing notes.   HISTORY  Chief Complaint Chronic right leg pain   HPI Shane [name] is a 58 y.o. male with a history of cervical spinal fusion, chronic low back pain with sciatica who complains of cramping in both calves.  He was seen in the ED 2 days ago, at which point his Robaxin was refilled.  He has not picked up the prescription yet.  No aggravating or alleviating factors.  Pain is intermittent.  No chest pain shortness of breath or fever.  No acute leg weakness or difficulty with bowel or bladder control.  No saddle anesthesia.  He is under the care of [facility] orthopedics Dr. Jaynie Collins.  He had MRI of the T and L-spine ordered in March 2020 but patient never went to have the studies done.  Patient requests opioid pain medicines.    Past Medical History:  Diagnosis Date  . Anemia   . Anxiety   . Drug-seeking behavior    hx of narcotics dependence, previously discharged from pain management clinic  . Encounter for blood transfusion 2005, 02/2014  . GERD (gastroesophageal reflux disease)   . Hemorrhoid   . Herniated cervical disc      Patient Active Problem List   Diagnosis Date Noted  . Hemorrhoid 05/09/2014  . Skin abnormality 05/09/2014     Past Surgical History:  Procedure Laterality Date  . CERVICAL FUSION    . COLONOSCOPY  2/16   Dr. Mechele Collin  . FRACTURE SURGERY Left    fractured femur  . prolapsed rectum surgery  04/30/1995   [facility], trans abdominal mesh repair per patient report  . SEPTOPLASTY       Prior to Admission medications   Medication Sig Start Date End Date Taking? Authorizing Provider  cyclobenzaprine (FLEXERIL) 10 MG tablet Take 1 tablet by mouth 2 (two) times daily as needed. 09/04/16   [provider]  docusate sodium (COLACE) 100 MG  capsule Take 250 mg by mouth 2 (two) times daily.     [provider]  Ferrous Sulfate (IRON) 325 (65 FE) MG TABS Take 1 tablet by mouth daily.    [provider]  fluticasone (FLONASE) 50 MCG/ACT nasal spray Place 1 spray into both nostrils daily.  04/20/14   [provider]  gabapentin (NEURONTIN) 100 MG capsule Take 1 capsule by mouth 3 (three) times daily. 10/02/16   [provider]  glycerin adult 2 g suppository Place 1 suppository rectally as needed for constipation. 09/25/17   Arnaldo Natal, MD  HYDROcodone-acetaminophen (NORCO/VICODIN) 5-325 MG tablet Take 1 tablet by mouth every 4 (four) hours as needed for moderate pain. 10/12/18 10/12/19  Nita Sickle, MD  hydrocortisone (ANUSOL-HC) 25 MG suppository Place 1 suppository (25 mg total) rectally 2 (two) times daily. 05/22/14   Earline Mayotte, MD  hydrocortisone (PROCTOSOL HC) 2.5 % rectal cream Place 1 application rectally 2 (two) times daily. 02/27/17   Emily Filbert, MD  hydrocortisone 2.5 % cream Apply 1 application topically daily. 11/30/16   [provider]  hydrocortisone-pramoxine (PROCTOFOAM HC) rectal foam Place 1 applicator rectally 2 (two) times daily. 12/22/16   Loleta Rose, MD  hydrOXYzine (ATARAX/VISTARIL) 10 MG tablet Take 1 tablet (10 mg total) by mouth 3 (three) times daily as needed for itching. 04/12/16   Schaevitz, Myra Rude, MD  methocarbamol Nicholaus Corolla)  500 MG tablet Take 1 tablet (500 mg total) by mouth 4 (four) times daily. 10/10/18   Cuthriell, Delorise RoyalsJonathan D, PA-C  methocarbamol (ROBAXIN) 750 MG tablet Take 1 tablet (750 mg total) by mouth every 8 (eight) hours as needed for muscle spasms. 10/22/18   Darci CurrentBrown, [place] N, MD  omeprazole (PRILOSEC) 20 MG capsule Take 20 mg by mouth daily.  04/16/14   [provider]  pantoprazole (PROTONIX) 40 MG tablet Take 40 mg by mouth daily. 11/30/16   [provider]  predniSONE (DELTASONE) 10 MG tablet Take 1  tablet (10 mg total) by mouth daily. 10/10/18   Cuthriell, Delorise RoyalsJonathan D, PA-C  terbinafine (LAMISIL) 250 MG tablet Take 250 mg by mouth daily. 08/11/16   [provider]  traMADol (ULTRAM) 50 MG tablet Take 1 tablet (50 mg total) by mouth every 6 (six) hours as needed. 09/30/18 09/30/19  Darci CurrentBrown, [place] N, MD     Allergies Codeine and Amoxicillin   No family history on file.  Social History Social History   Tobacco Use  . Smoking status: Former Smoker    Years: 20.00    Quit date: 01/05/2005    Years since quitting: 13.8  . Smokeless tobacco: Current User  Substance Use Topics  . Alcohol use: Yes    Alcohol/week: 0.0 standard drinks  . Drug use: No    Review of Systems  Constitutional:   No fever or chills.  Cardiovascular:   No chest pain or syncope. Respiratory:   No dyspnea or cough. Gastrointestinal:   Negative for abdominal pain, vomiting and diarrhea.  Musculoskeletal: Chronic low back pain and right leg pain All other systems reviewed and are negative except as documented above in ROS and HPI.  ____________________________________________   PHYSICAL EXAM:  VITAL SIGNS: ED Triage Vitals  Enc Vitals Group     BP 10/23/18 2344 116/74     Pulse Rate 10/23/18 2344 96     Resp 10/23/18 2344 17     Temp 10/23/18 2344 97.8 F (36.6 C)     Temp Source 10/23/18 2344 Oral     SpO2 10/23/18 2344 98 %     Weight 10/23/18 2345 161 lb (73 kg)     Height 10/23/18 2345 5\' 9"  (1.753 m)     Head Circumference --      Peak Flow --      Pain Score 10/24/18 0257 10     Pain Loc --      Pain Edu? --      Excl. in [place]? --     Vital signs reviewed, nursing assessments reviewed.   Constitutional:   Alert and oriented. Non-toxic appearance. Eyes:   Conjunctivae are normal. EOMI. ENT      Head:   Normocephalic and atraumatic.      Mouth/Throat:   MMM      Neck:   No meningismus. Full ROM. Abdominal: Nontender, no bladder distention Musculoskeletal:   Normal range of  motion in all extremities.  No edema. Neurologic:   Normal speech and language.  Ambulatory with steady gait Strong dorsiflexion bilaterally, EHL positive Motor grossly intact. No acute focal neurologic deficits are appreciated.   ____________________________________________    LABS (pertinent positives/negatives) (all labs ordered are listed, but only abnormal results are displayed) Labs Reviewed - No data to display ____________________________________________   EKG  ____________________________________________    RADIOLOGY  No results found.  ____________________________________________   PROCEDURES Procedures  ____________________________________________  CLINICAL IMPRESSION / ASSESSMENT AND PLAN /  ED COURSE  Pertinent labs & imaging results that were available during my care of the patient were reviewed by me and considered in my medical decision making (see chart for details).  Shane [name] was evaluated in Emergency Department on 10/24/2018 for the symptoms described in the history of present illness. He was evaluated in the context of the global COVID-19 pandemic, which necessitated consideration that the patient might be at risk for infection with the SARS-CoV-2 virus that causes COVID-19. Institutional protocols and algorithms that pertain to the evaluation of patients at risk for COVID-19 are in a state of rapid change based on information released by regulatory bodies including the CDC and federal and state organizations. These policies and algorithms were followed during the patient's care in the ED.   Patient well-appearing nontoxic, normal vital signs, unremarkable exam.  Presents with chronic pain, requesting opioid pain medicine.  Advised that as a matter of policy, we will not provide opioids for chronic pain out of concern for dangerous side effects of these medicines.  Advised that he should call his orthopedic clinic to reschedule his MRIs that were  ordered in March and pick up his Robaxin prescription is waiting for him at the [facility] in [place].     ____________________________________________   FINAL CLINICAL IMPRESSION(S) / ED DIAGNOSES    Final diagnoses:  Chronic right-sided low back pain with right-sided sciatica     ED Discharge Orders    None      Portions of this note were generated with dragon dictation software. Dictation errors may occur despite best attempts at proofreading.   Carrie Mew, MD 10/24/18 [phone]

## 2018-10-24 NOTE — Discharge Instructions (Signed)
10/10/2018 CLINICAL DATA:  Worsening back pain and sciatica   EXAM: CT LUMBAR SPINE WITHOUT CONTRAST   TECHNIQUE: Multidetector CT imaging of the lumbar spine was performed without intravenous contrast administration. Multiplanar CT image reconstructions were also generated.   COMPARISON:  None.   FINDINGS: Segmentation: There are 5 non-rib bearing lumbar type vertebral bodies with the last intervertebral disc space labeled as L5-S1.   Alignment: Normal   Vertebrae: There a left-sided L5 chronic pars defect. The vertebral body heights are well maintained. No fracture, malalignment, or pathologic osseous lesions seen.   Paraspinal and other soft tissues: The paraspinal soft tissues and visualized retroperitoneal structures are unremarkable. The sacroiliac joints are intact.   Disc levels: At L5-S1 there is a broad-based disc bulge and mild bilateral neural foraminal narrowing.   IMPRESSION: Left-sided unilateral L5 chronic pars defect.   Lumbar spine spondylosis most notable at L5-S1 with mild bilateral neural foraminal narrowing.

## 2018-10-26 ENCOUNTER — Other Ambulatory Visit: Payer: Self-pay

## 2018-10-26 DIAGNOSIS — Z79899 Other long term (current) drug therapy: Secondary | ICD-10-CM | POA: Diagnosis not present

## 2018-10-26 DIAGNOSIS — G8929 Other chronic pain: Secondary | ICD-10-CM | POA: Diagnosis not present

## 2018-10-26 DIAGNOSIS — M549 Dorsalgia, unspecified: Secondary | ICD-10-CM | POA: Insufficient documentation

## 2018-10-26 DIAGNOSIS — F17220 Nicotine dependence, chewing tobacco, uncomplicated: Secondary | ICD-10-CM | POA: Diagnosis not present

## 2018-10-26 NOTE — ED Triage Notes (Addendum)
Pt comes via POV from home with c/o right leg pain. Pt states this started a long time ago.  Pt states 10/10 pain. Pt states he has a pinched nerve from last scans.  Pt states he was given Vicodin and it helped with the pain, but states it is hard to get.  Pt denies any CP, SOB and abdominal pain.

## 2018-10-27 ENCOUNTER — Emergency Department
Admission: EM | Admit: 2018-10-27 | Discharge: 2018-10-27 | Disposition: A | Discharge status: Home / Self Care | Payer: Medicaid Other | Attending: Emergency Medicine | Admitting: Emergency Medicine

## 2018-10-27 ENCOUNTER — Other Ambulatory Visit: Payer: Self-pay

## 2018-10-27 DIAGNOSIS — G8929 Other chronic pain: Secondary | ICD-10-CM

## 2018-10-27 DIAGNOSIS — Z79899 Other long term (current) drug therapy: Secondary | ICD-10-CM | POA: Insufficient documentation

## 2018-10-27 DIAGNOSIS — F17290 Nicotine dependence, other tobacco product, uncomplicated: Secondary | ICD-10-CM | POA: Insufficient documentation

## 2018-10-27 NOTE — ED Triage Notes (Signed)
Pt states he fell today and is having back pain.

## 2018-10-27 NOTE — ED Notes (Addendum)
Pt with multiple pain complaints and requesting pain med. He spoke with EDP and is awaiting psych evaluation

## 2018-10-27 NOTE — ED Notes (Signed)
SOC cancelled per Dr. Owens Shark.

## 2018-10-27 NOTE — ED Notes (Addendum)
Dr. Owens Shark in to eval, patient requesting narcotics for chronic pain. Dr. Owens Shark offered toradol for pain and explained he will not be prescribing narcotics at this time. Pt then requesting to talk to psychiatry due to "someone is out to kill me", denies any SI or HI. SOC ordered per Dr. Owens Shark.

## 2018-10-27 NOTE — ED Notes (Signed)
Pt states he is tired and does not want to wait for [facility] and will use outpatient resources. Resources provided to patient and reviewed. Did ask patient if he needed to talk to the police about the statement that someone is trying to hurt him but he stated "noone is trying to threatening me at this time".

## 2018-10-27 NOTE — ED Provider Notes (Signed)
[facility] Emergency Department Provider Note    First MD Initiated Contact with Patient 10/27/18 [phone]     (approximate)  I have reviewed the triage vital signs and the nursing notes.   HISTORY  Chief Complaint Leg Pain   HPI Shane [name] is a 58 y.o. male with below list of previous medical conditions including chronic back pain, narcotic seeking behavior who has been seen in the emergency department 9 times since September 25 returns to the emergency department secondary to stated 10 out of 10 back pain.  Patient denies any neurological deficits.  Patient denies any loss of bowel or bladder control.  Patient denies any fever.  Patient is requesting Vicodin.        Past Medical History:  Diagnosis Date  . Anemia   . Anxiety   . Drug-seeking behavior    hx of narcotics dependence, previously discharged from pain management clinic  . Encounter for blood transfusion 2005, 02/2014  . GERD (gastroesophageal reflux disease)   . Hemorrhoid   . Herniated cervical disc     Patient Active Problem List   Diagnosis Date Noted  . Hemorrhoid 05/09/2014  . Skin abnormality 05/09/2014    Past Surgical History:  Procedure Laterality Date  . CERVICAL FUSION    . COLONOSCOPY  2/16   Dr. Vira Agar  . FRACTURE SURGERY Left    fractured femur  . prolapsed rectum surgery  04/30/1995   [facility], trans abdominal mesh repair per patient report  . SEPTOPLASTY      Prior to Admission medications   Medication Sig Start Date End Date Taking? Authorizing Provider  cyclobenzaprine (FLEXERIL) 10 MG tablet Take 1 tablet by mouth 2 (two) times daily as needed. 09/04/16   [provider]  docusate sodium (COLACE) 100 MG capsule Take 250 mg by mouth 2 (two) times daily.     [provider]  Ferrous Sulfate (IRON) 325 (65 FE) MG TABS Take 1 tablet by mouth daily.    [provider]  fluticasone (FLONASE) 50 MCG/ACT nasal spray Place 1  spray into both nostrils daily.  04/20/14   [provider]  gabapentin (NEURONTIN) 100 MG capsule Take 1 capsule by mouth 3 (three) times daily. 10/02/16   [provider]  glycerin adult 2 g suppository Place 1 suppository rectally as needed for constipation. 09/25/17   Nena Polio, MD  HYDROcodone-acetaminophen (NORCO/VICODIN) 5-325 MG tablet Take 1 tablet by mouth every 4 (four) hours as needed for moderate pain. 10/12/18 10/12/19  Rudene Re, MD  hydrocortisone (ANUSOL-HC) 25 MG suppository Place 1 suppository (25 mg total) rectally 2 (two) times daily. 05/22/14   Robert Bellow, MD  hydrocortisone (PROCTOSOL HC) 2.5 % rectal cream Place 1 application rectally 2 (two) times daily. 02/27/17   Earleen Newport, MD  hydrocortisone 2.5 % cream Apply 1 application topically daily. 11/30/16   [provider]  hydrocortisone-pramoxine (PROCTOFOAM HC) rectal foam Place 1 applicator rectally 2 (two) times daily. 12/22/16   Hinda Kehr, MD  hydrOXYzine (ATARAX/VISTARIL) 10 MG tablet Take 1 tablet (10 mg total) by mouth 3 (three) times daily as needed for itching. 04/12/16   Schaevitz, Randall An, MD  methocarbamol (ROBAXIN) 500 MG tablet Take 1 tablet (500 mg total) by mouth 4 (four) times daily. 10/10/18   Cuthriell, Charline Bills, PA-C  methocarbamol (ROBAXIN) 750 MG tablet Take 1 tablet (750 mg total) by mouth every 8 (eight) hours as needed for muscle spasms.  10/22/18   Darci CurrentBrown, [place] N, MD  omeprazole (PRILOSEC) 20 MG capsule Take 20 mg by mouth daily.  04/16/14   [provider]  pantoprazole (PROTONIX) 40 MG tablet Take 40 mg by mouth daily. 11/30/16   [provider]  predniSONE (DELTASONE) 10 MG tablet Take 1 tablet (10 mg total) by mouth daily. 10/10/18   Cuthriell, Delorise RoyalsJonathan D, PA-C  terbinafine (LAMISIL) 250 MG tablet Take 250 mg by mouth daily. 08/11/16   [provider]  traMADol (ULTRAM) 50 MG tablet Take 1 tablet (50 mg total) by  mouth every 6 (six) hours as needed. 09/30/18 09/30/19  Darci CurrentBrown, [place] N, MD    Allergies Codeine and Amoxicillin  No family history on file.  Social History Social History   Tobacco Use  . Smoking status: Former Smoker    Years: 20.00    Quit date: 01/05/2005    Years since quitting: 13.8  . Smokeless tobacco: Current User  Substance Use Topics  . Alcohol use: Yes    Alcohol/week: 0.0 standard drinks  . Drug use: No    Review of Systems Constitutional: No fever/chills Eyes: No visual changes. ENT: No sore throat. Cardiovascular: Denies chest pain. Respiratory: Denies shortness of breath. Gastrointestinal: No abdominal pain.  No nausea, no vomiting.  No diarrhea.  No constipation. Genitourinary: Negative for dysuria. Musculoskeletal: Negative for neck pain.  Positive for back pain. Integumentary: Negative for rash. Neurological: Negative for headaches, focal weakness or numbness.  ____________________________________________   PHYSICAL EXAM:  VITAL SIGNS: ED Triage Vitals [10/26/18 2134]  Enc Vitals Group     BP 131/71     Pulse Rate (!) 104     Resp 18     Temp 98.3 F (36.8 C)     Temp src      SpO2 96 %     Weight 73 kg (161 lb)     Height 1.753 m (5\' 9" )     Head Circumference      Peak Flow      Pain Score 10     Pain Loc      Pain Edu?      Excl. in GC?     Constitutional: Alert and oriented.  Eyes: Conjunctivae are normal.  Mouth/Throat: Patient is wearing a mask. Neck: No stridor.  No meningeal signs.   Cardiovascular: Normal rate, regular rhythm. Good peripheral circulation. Grossly normal heart sounds. Respiratory: Normal respiratory effort.  No retractions. Gastrointestinal: Soft and nontender. No distention.  Musculoskeletal: No lower extremity tenderness nor edema. No gross deformities of extremities. Neurologic:  Normal speech and language. No gross focal neurologic deficits are appreciated.  Skin:  Skin is warm, dry and intact.  Psychiatric: Mood and affect are normal. Speech and behavior are normal.     Procedures   ____________________________________________   INITIAL IMPRESSION / MDM / ASSESSMENT AND PLAN / ED COURSE  As part of my medical decision making, I reviewed the following data within the electronic MEDICAL RECORD NUMBER  58 year old male presenting with above-stated history and physical exam secondary to chronic back pain for which patient is currently requesting Vicodin.  I informed the patient that he would not receive a narcotic prescription in the emergency department.  Patient states that he still has not filled his prescription for Robaxin which she requested 2 visits ago.  After patient was informed that he would not be receiving any narcotic prescription from the emergency department the patient requested to speak to psychiatry.  Patient states  he would like to speak to psychiatry secondary to "fear".  Patient states that someone is trying to kill me.  Patient denies any suicidal ideation.  Patient denies any homicidal ideation.  Patient denies any auditory visual hallucinations.  Patient requested to speak to telepsychiatry refused to speak with the nurse practitioner on call for psychiatry.   2:50 AM patient notified the nursing staff that he would not be waiting any longer and requested to leave the emergency department.  Patient does not meet criteria for involuntary commitment and as such will be discharged.  Patient was given outpatient resources for psychiatry.  Patient was also offered to speak to the police regarding his concerns for his wellbeing however the patient refused to speak with the police.     ____________________________________________  FINAL CLINICAL IMPRESSION(S) / ED DIAGNOSES  Final diagnoses:  Other chronic pain     MEDICATIONS GIVEN DURING THIS VISIT:  Medications - No data to display   ED Discharge Orders    None      *Please note:  Shane [name] was  evaluated in Emergency Department on 10/27/2018 for the symptoms described in the history of present illness. He was evaluated in the context of the global COVID-19 pandemic, which necessitated consideration that the patient might be at risk for infection with the SARS-CoV-2 virus that causes COVID-19. Institutional protocols and algorithms that pertain to the evaluation of patients at risk for COVID-19 are in a state of rapid change based on information released by regulatory bodies including the CDC and federal and state organizations. These policies and algorithms were followed during the patient's care in the ED.  Some ED evaluations and interventions may be delayed as a result of limited staffing during the pandemic.*  Note:  This document was prepared using Dragon voice recognition software and may include unintentional dictation errors.   Darci Current, MD 10/27/18 [phone]

## 2018-10-27 NOTE — ED Notes (Signed)
SOC set up at bedside. 

## 2018-10-28 ENCOUNTER — Emergency Department
Admission: EM | Admit: 2018-10-28 | Discharge: 2018-10-28 | Disposition: A | Discharge status: Home / Self Care | Payer: Medicaid Other | Source: Home / Self Care | Attending: Emergency Medicine | Admitting: Emergency Medicine

## 2018-10-28 DIAGNOSIS — G8929 Other chronic pain: Secondary | ICD-10-CM

## 2018-10-28 MED ORDER — LIDOCAINE 5 % EX PTCH: 1.0000 | MEDICATED_PATCH | Freq: Two times a day (BID) | CUTANEOUS | 0 refills | Status: DC

## 2018-10-28 MED ORDER — LIDOCAINE 5 % EX PTCH
2.0000 | MEDICATED_PATCH | CUTANEOUS | Status: DC
  Administered 2018-10-28: 2 via TRANSDERMAL
  Filled 2018-10-28: qty 2

## 2018-10-28 NOTE — ED Provider Notes (Signed)
[facility] Emergency Department Provider Note   First MD Initiated Contact with Patient 10/28/18 [phone]     (approximate)  I have reviewed the triage vital signs and the nursing notes.   HISTORY  Chief Complaint Fall    HPI Shane [name] is a 58 y.o. male with below list of previous medical conditions including chronic back pain narcotic seeking behavior returns to the emergency department tonight secondary to low back pain.  Patient states that he accidentally fell today and currently has 10 out of 10 low back pain.  Patient denies any bowel or bladder incontinence.  Patient states that the pain radiates down his posterior right leg.  [facility] on 09/30/2018 10/10/2018 10/12/2018 10/14/2018 10/18/2018 2020 10/22/2018 10/24/2018 for the same.        Past Medical History:  Diagnosis Date  . Anemia   . Anxiety   . Drug-seeking behavior    hx of narcotics dependence, previously discharged from pain management clinic  . Encounter for blood transfusion 2005, 02/2014  . GERD (gastroesophageal reflux disease)   . Hemorrhoid   . Herniated cervical disc     Patient Active Problem List   Diagnosis Date Noted  . Hemorrhoid 05/09/2014  . Skin abnormality 05/09/2014    Past Surgical History:  Procedure Laterality Date  . CERVICAL FUSION    . COLONOSCOPY  2/16   Dr. Mechele CollinElliott  . FRACTURE SURGERY Left    fractured femur  . prolapsed rectum surgery  04/30/1995   [facility], trans abdominal mesh repair per patient report  . SEPTOPLASTY      Prior to Admission medications   Medication Sig Start Date End Date Taking? Authorizing Provider  cyclobenzaprine (FLEXERIL) 10 MG tablet Take 1 tablet by mouth 2 (two) times daily as needed. 09/04/16   [provider]  docusate sodium (COLACE) 100 MG capsule Take 250 mg by mouth 2 (two) times daily.     [provider]  Ferrous Sulfate (IRON) 325 (65 FE) MG TABS  Take 1 tablet by mouth daily.    [provider]  fluticasone (FLONASE) 50 MCG/ACT nasal spray Place 1 spray into both nostrils daily.  04/20/14   [provider]  gabapentin (NEURONTIN) 100 MG capsule Take 1 capsule by mouth 3 (three) times daily. 10/02/16   [provider]  glycerin adult 2 g suppository Place 1 suppository rectally as needed for constipation. 09/25/17   Arnaldo NatalMalinda, Paul F, MD  HYDROcodone-acetaminophen (NORCO/VICODIN) 5-325 MG tablet Take 1 tablet by mouth every 4 (four) hours as needed for moderate pain. 10/12/18 10/12/19  Nita SickleVeronese, Silvana, MD  hydrocortisone (ANUSOL-HC) 25 MG suppository Place 1 suppository (25 mg total) rectally 2 (two) times daily. 05/22/14   Earline MayotteByrnett, Jeffrey W, MD  hydrocortisone (PROCTOSOL HC) 2.5 % rectal cream Place 1 application rectally 2 (two) times daily. 02/27/17   Emily FilbertWilliams, Jonathan E, MD  hydrocortisone 2.5 % cream Apply 1 application topically daily. 11/30/16   [provider]  hydrocortisone-pramoxine (PROCTOFOAM HC) rectal foam Place 1 applicator rectally 2 (two) times daily. 12/22/16   Loleta RoseForbach, Cory, MD  hydrOXYzine (ATARAX/VISTARIL) 10 MG tablet Take 1 tablet (10 mg total) by mouth 3 (three) times daily as needed for itching. 04/12/16   Schaevitz, Myra Rudeavid Matthew, MD  lidocaine (LIDODERM) 5 % Place 1 patch onto the skin every 12 (twelve) hours. Remove & Discard patch within 12 hours or as directed by MD 10/28/18 10/28/19  Bayard MalesBrown, [place]  N, MD  methocarbamol (ROBAXIN) 500 MG tablet Take 1 tablet (500 mg total) by mouth 4 (four) times daily. 10/10/18   Cuthriell, Delorise Royals, PA-C  methocarbamol (ROBAXIN) 750 MG tablet Take 1 tablet (750 mg total) by mouth every 8 (eight) hours as needed for muscle spasms. 10/22/18   Darci Current, MD  omeprazole (PRILOSEC) 20 MG capsule Take 20 mg by mouth daily.  04/16/14   [provider]  pantoprazole (PROTONIX) 40 MG tablet Take 40 mg by mouth daily. 11/30/16   [provider]  predniSONE (DELTASONE) 10 MG tablet Take 1 tablet (10 mg total) by mouth daily. 10/10/18   Cuthriell, Delorise Royals, PA-C  terbinafine (LAMISIL) 250 MG tablet Take 250 mg by mouth daily. 08/11/16   [provider]  traMADol (ULTRAM) 50 MG tablet Take 1 tablet (50 mg total) by mouth every 6 (six) hours as needed. 09/30/18 09/30/19  Darci Current, MD    Allergies Codeine and Amoxicillin  No family history on file.  Social History Social History   Tobacco Use  . Smoking status: Former Smoker    Years: 20.00    Quit date: 01/05/2005    Years since quitting: 13.8  . Smokeless tobacco: Current User  Substance Use Topics  . Alcohol use: Yes    Alcohol/week: 0.0 standard drinks  . Drug use: No    Review of Systems Constitutional: No fever/chills Eyes: No visual changes. ENT: No sore throat. Cardiovascular: Denies chest pain. Respiratory: Denies shortness of breath. Gastrointestinal: No abdominal pain.  No nausea, no vomiting.  No diarrhea.  No constipation. Genitourinary: Negative for dysuria. Musculoskeletal: Negative for neck pain.  Positive for back pain. Integumentary: Negative for rash. Neurological: Negative for headaches, focal weakness or numbness.  ____________________________________________   PHYSICAL EXAM:  VITAL SIGNS: ED Triage Vitals  Enc Vitals Group     BP 10/27/18 2314 120/69     Pulse Rate 10/27/18 2314 82     Resp 10/27/18 2314 20     Temp 10/27/18 2314 98.4 F (36.9 C)     Temp Source 10/27/18 2314 Oral     SpO2 10/27/18 2314 99 %     Weight 10/27/18 2315 73 kg (161 lb)     Height 10/27/18 2315 1.753 m (5\' 9" )     Head Circumference --      Peak Flow --      Pain Score 10/27/18 2315 10     Pain Loc --      Pain Edu? --      Excl. in GC? --     Constitutional: Alert and oriented.  Eyes: Conjunctivae are normal.  Head: Atraumatic. Mouth/Throat: Patient is wearing a mask. Neck: No stridor.  No meningeal signs.    Cardiovascular: Normal rate, regular rhythm. Good peripheral circulation. Grossly normal heart sounds. Respiratory: Normal respiratory effort.  No retractions. Gastrointestinal: Soft and nontender. No distention.  Musculoskeletal: No lower extremity tenderness nor edema. No gross deformities of extremities.  Palpation of the thoracic and lumbar spine revealed no step-off.  Neurologic:  Normal speech and language. No gross focal neurologic deficits are appreciated.  Skin:  Skin is warm, dry and intact. Psychiatric: Mood and affect are normal. Speech and behavior are normal.     Procedures   ____________________________________________   INITIAL IMPRESSION / MDM / ASSESSMENT AND PLAN / ED COURSE  As part of my medical decision making, I reviewed the following data within the electronic MEDICAL RECORD NUMBER 58 year old male  with above-stated history and physical exam secondary to back pain following a fall.  Patient given Lidoderm patches in the emergency department.  Patient was informed again that he would not be receiving any narcotic prescription from the emergency department.  Patient will be referred to chronic pain again for further outpatient evaluation and management.  ____________________________________________  FINAL CLINICAL IMPRESSION(S) / ED DIAGNOSES  Final diagnoses:  Other chronic pain     MEDICATIONS GIVEN DURING THIS VISIT:  Medications  lidocaine (LIDODERM) 5 % 2 patch (2 patches Transdermal Patch Applied 10/28/18 0258)     ED Discharge Orders         Ordered    lidocaine (LIDODERM) 5 %  Every 12 hours     10/28/18 0255          *Please note:  SWAN ZAYED was evaluated in Emergency Department on 10/28/2018 for the symptoms described in the history of present illness. He was evaluated in the context of the global COVID-19 pandemic, which necessitated consideration that the patient might be at risk for infection with the SARS-CoV-2 virus that causes  COVID-19. Institutional protocols and algorithms that pertain to the evaluation of patients at risk for COVID-19 are in a state of rapid change based on information released by regulatory bodies including the CDC and federal and state organizations. These policies and algorithms were followed during the patient's care in the ED.  Some ED evaluations and interventions may be delayed as a result of limited staffing during the pandemic.*  Note:  This document was prepared using Dragon voice recognition software and may include unintentional dictation errors.   Gregor Hams, MD 10/28/18 [phone]

## 2018-10-31 ENCOUNTER — Other Ambulatory Visit: Payer: Self-pay

## 2018-10-31 DIAGNOSIS — M79606 Pain in leg, unspecified: Secondary | ICD-10-CM | POA: Diagnosis present

## 2018-10-31 DIAGNOSIS — Z5321 Procedure and treatment not carried out due to patient leaving prior to being seen by health care provider: Secondary | ICD-10-CM | POA: Diagnosis not present

## 2018-10-31 DIAGNOSIS — R509 Fever, unspecified: Secondary | ICD-10-CM | POA: Diagnosis not present

## 2018-10-31 NOTE — ED Triage Notes (Signed)
Pt comes via POV from home with c/o leg pain and fever. Pt states this has been going on. Pt states he hopes he doesn't have the COVID.  Pt denies being around anyone who has tested positive. Pt denies any N/V/D.

## 2018-11-01 ENCOUNTER — Emergency Department
Admission: EM | Admit: 2018-11-01 | Discharge: 2018-11-01 | Disposition: A | Discharge status: Home / Self Care | Payer: Medicaid Other | Attending: Emergency Medicine | Admitting: Emergency Medicine

## 2018-11-01 ENCOUNTER — Emergency Department
Admission: EM | Admit: 2018-11-01 | Discharge: 2018-11-01 | Disposition: A | Discharge status: Home / Self Care | Payer: Medicaid Other | Source: Home / Self Care | Attending: Emergency Medicine | Admitting: Emergency Medicine

## 2018-11-01 ENCOUNTER — Encounter: Payer: Self-pay | Admitting: Emergency Medicine

## 2018-11-01 DIAGNOSIS — M5431 Sciatica, right side: Secondary | ICD-10-CM

## 2018-11-01 DIAGNOSIS — Z765 Malingerer [conscious simulation]: Secondary | ICD-10-CM | POA: Insufficient documentation

## 2018-11-01 DIAGNOSIS — Z20828 Contact with and (suspected) exposure to other viral communicable diseases: Secondary | ICD-10-CM | POA: Insufficient documentation

## 2018-11-01 DIAGNOSIS — F602 Antisocial personality disorder: Secondary | ICD-10-CM | POA: Insufficient documentation

## 2018-11-01 DIAGNOSIS — M5441 Lumbago with sciatica, right side: Secondary | ICD-10-CM

## 2018-11-01 DIAGNOSIS — F99 Mental disorder, not otherwise specified: Secondary | ICD-10-CM

## 2018-11-01 DIAGNOSIS — F69 Unspecified disorder of adult personality and behavior: Secondary | ICD-10-CM

## 2018-11-01 DIAGNOSIS — Z79899 Other long term (current) drug therapy: Secondary | ICD-10-CM | POA: Insufficient documentation

## 2018-11-01 DIAGNOSIS — Z87891 Personal history of nicotine dependence: Secondary | ICD-10-CM | POA: Insufficient documentation

## 2018-11-01 DIAGNOSIS — G8929 Other chronic pain: Secondary | ICD-10-CM

## 2018-11-01 LAB — SARS CORONAVIRUS 2 (TAT 6-24 HRS): SARS Coronavirus 2: NEGATIVE

## 2018-11-01 MED ORDER — TRAMADOL HCL 50 MG PO TABS: 50.0000 mg | ORAL_TABLET | Freq: Three times a day (TID) | ORAL | 0 refills | Status: AC | PRN

## 2018-11-01 MED ORDER — KETOROLAC TROMETHAMINE 30 MG/ML IJ SOLN
30.0000 mg | Freq: Once | INTRAMUSCULAR | Status: DC
  Filled 2018-11-01: qty 1

## 2018-11-01 MED ORDER — PREDNISONE 20 MG PO TABS
60.0000 mg | ORAL_TABLET | Freq: Once | ORAL | Status: AC
  Administered 2018-11-01: 60 mg via ORAL
  Filled 2018-11-01: qty 3

## 2018-11-01 MED ORDER — KETOROLAC TROMETHAMINE 30 MG/ML IJ SOLN: 30.0000 mg | Freq: Once | INTRAMUSCULAR | Status: DC

## 2018-11-01 MED ORDER — PREDNISONE 20 MG PO TABS: 40.0000 mg | ORAL_TABLET | Freq: Every day | ORAL | 0 refills | Status: AC

## 2018-11-01 NOTE — ED Triage Notes (Signed)
Pt reports has a pinched nerve in his back and it is causing his leg to hurt. Pt reports has been having this issue for some time.

## 2018-11-01 NOTE — Consult Note (Signed)
[facility] Face[facility]to[facility]Face [facility]   Reason for Consult: Anxiety and paranoia Referring Physician: Dr. Erma HeritageIsaacs Patient Identification: Shane [name] MRN:  [id number] Principal Diagnosis: <principal problem not specified> Diagnosis:  Active Problems:   * No active hospital problems. *   Total Time spent with patient: 45 minutes  Subjective:   Shane [name] is a 58 y.o. male patient who presented to the ED with leg pain.  Patient was requesting narcotic medication and became upset when told he would not be offered any.  Patient then asked to speak to psychiatry.  HPI: Psychiatry was consulted after patient asked to speak to psychiatry once been denied narcotics for his leg pain.  Patient starts off with a long story dating back to [id number] and when she became involved with the [employer].  Patient states that since that time he has maintained some relationships with them.  Patient alleges a 10-year history in jail due to rape charges that he states "I did not do it".  Patient then goes on to state how his current living situation is undesirable because there are many people who do drugs around him.  Patient goes on to tell writer that Clinical research associatewriter will need to call his brother and demanding that the patient live there despite the patient's brother telling him he is no longer welcome there.  Patient goes on to state that the people who are living near him are often suspicious of him and do not like him because of his sexual offender status.  He feels that they have made threats at him or even hinted at attempting to kill him.  Patient states that he has not gone to the police with this matter, and is vague as to why.  Patient is vague in response to all of writer's questions including writer's questions about his drug use and drug-seeking behavior.  During the course of his evaluation patient made it clear that he wanted to live and that he was not internally preoccupied.  Patient was also open and unguarded  with his story and continue to share the details with Clinical research associatewriter. Patient was also vague with regards to his psychiatric history but was able to tell writer that he had 1 previous psychiatric admission where he was not given a psychiatric diagnosis but rather diagnosed with personality disorder.  Patient denies taking any antipsychotic medications or any medications in the past for psychiatric reasons.  Upon learning that patient does not meet criteria for inpatient admission patient's tone changed.  He began screaming and banging his fist on the bed demanding writer to admit him or to call his brother and force his brother to take him home.  Writer continue to inform patient that he had no control over his brother's house at which point patient continued to yell and even threatened suicide.  Patient was told that he will not be admitted to the psychiatric unit.  Collateral was attempted but no one can be reached at the phone number listed.  Past Psychiatric History: 1 prior admission for personality disorder back in 1991 no medications or hospitalizations since.  Denies having a psychiatrist or needing a psychiatrist for the past 30 years.  Patient also has a 10-year stent in jail for rape charges and is currently a sexual offender.  Risk to Self:  No Risk to Others:  No Prior Inpatient Therapy:  Yes Prior Outpatient Therapy:  No  Past Medical History:  Past Medical History:  Diagnosis Date  . Anemia   .  Anxiety   . Drug-seeking behavior    hx of narcotics dependence, previously discharged from pain management clinic  . Encounter for blood transfusion 2005, 02/2014  . GERD (gastroesophageal reflux disease)   . Hemorrhoid   . Herniated cervical disc     Past Surgical History:  Procedure Laterality Date  . CERVICAL FUSION    . COLONOSCOPY  2/16   Dr. Vira Agar  . FRACTURE SURGERY Left    fractured femur  . prolapsed rectum surgery  04/30/1995   [facility], trans abdominal mesh repair  per patient report  . SEPTOPLASTY     Family History: No family history on file. Family Psychiatric  History: Denies Social History:  Social History   Substance and Sexual Activity  Alcohol Use Yes  . Alcohol/week: 0.0 standard drinks     Social History   Substance and Sexual Activity  Drug Use No    Social History   Socioeconomic History  . Marital status: Divorced    Spouse name: Not on file  . Number of children: Not on file  . Years of education: Not on file  . Highest education level: Not on file  Occupational History  . Not on file  Social Needs  . Financial resource strain: Not on file  . Food insecurity    Worry: Not on file    Inability: Not on file  . Transportation needs    Medical: Not on file    Non-medical: Not on file  Tobacco Use  . Smoking status: Former Smoker    Years: 20.00    Quit date: 01/05/2005    Years since quitting: 13.8  . Smokeless tobacco: Current User  Substance and Sexual Activity  . Alcohol use: Yes    Alcohol/week: 0.0 standard drinks  . Drug use: No  . Sexual activity: Not on file  Lifestyle  . Physical activity    Days per week: Not on file    Minutes per session: Not on file  . Stress: Not on file  Relationships  . Social Herbalist on phone: Not on file    Gets together: Not on file    Attends religious service: Not on file    Active member of club or organization: Not on file    Attends meetings of clubs or organizations: Not on file    Relationship status: Not on file  Other Topics Concern  . Not on file  Social History Narrative  . Not on file   Additional Social History: Patient does not work he is currently disabled.  Patient lives at a motel but it does not like his current living situation.  Patient acknowledges using pain pills but is vague regarding whether  they are prescribed to him or not    Allergies:   Allergies  Allergen Reactions  . Codeine     Stomach pain   . Amoxicillin Rash     Labs: No results found for this or any previous visit (from the past 48 hour(s)).  Current Facility-Administered Medications  Medication Dose Route Frequency Provider Last Rate Last Dose  . ertapenem (INVANZ) injection 1 g  1 g Intravenous Once Robert Bellow, MD      . ketorolac (TORADOL) 30 MG/ML injection 30 mg  30 mg Intramuscular Once Menshew, Dannielle Karvonen, PA-C       Current Outpatient Medications  Medication Sig Dispense Refill  . cyclobenzaprine (FLEXERIL) 10 MG tablet Take 1 tablet by mouth 2 (two)  times daily as needed.    . docusate sodium (COLACE) 100 MG capsule Take 250 mg by mouth 2 (two) times daily.     . Ferrous Sulfate (IRON) 325 (65 FE) MG TABS Take 1 tablet by mouth daily.    . fluticasone (FLONASE) 50 MCG/ACT nasal spray Place 1 spray into both nostrils daily.     Marland Kitchen gabapentin (NEURONTIN) 100 MG capsule Take 1 capsule by mouth 3 (three) times daily.    Marland Kitchen glycerin adult 2 g suppository Place 1 suppository rectally as needed for constipation. 12 suppository 0  . hydrocortisone (ANUSOL-HC) 25 MG suppository Place 1 suppository (25 mg total) rectally 2 (two) times daily. 12 suppository 1  . hydrocortisone (PROCTOSOL HC) 2.5 % rectal cream Place 1 application rectally 2 (two) times daily. 30 g 1  . hydrocortisone 2.5 % cream Apply 1 application topically daily.  0  . hydrocortisone-pramoxine (PROCTOFOAM HC) rectal foam Place 1 applicator rectally 2 (two) times daily. 10 g 0  . hydrOXYzine (ATARAX/VISTARIL) 10 MG tablet Take 1 tablet (10 mg total) by mouth 3 (three) times daily as needed for itching. 30 tablet 0  . lidocaine (LIDODERM) 5 % Place 1 patch onto the skin every 12 (twelve) hours. Remove & Discard patch within 12 hours or as directed by MD 10 patch 0  . methocarbamol (ROBAXIN) 750 MG tablet Take 1 tablet (750 mg total) by mouth every 8 (eight) hours as needed for muscle spasms. 30 tablet 0  . omeprazole (PRILOSEC) 20 MG capsule Take 20 mg by mouth daily.      . pantoprazole (PROTONIX) 40 MG tablet Take 40 mg by mouth daily.  0  . predniSONE (DELTASONE) 20 MG tablet Take 2 tablets (40 mg total) by mouth daily with breakfast for 5 days. 10 tablet 0  . terbinafine (LAMISIL) 250 MG tablet Take 250 mg by mouth daily.    . traMADol (ULTRAM) 50 MG tablet Take 1 tablet (50 mg total) by mouth 3 (three) times daily as needed for up to 3 days. 10 tablet 0    Musculoskeletal: Strength & Muscle Tone: within normal limits Gait & Station: normal Patient leans: N/A  Psychiatric Specialty Exam: Physical Exam  Review of Systems  Constitutional: Negative for fever.  HENT: Negative for hearing loss.   Eyes: Negative for blurred vision.  Cardiovascular: Negative for chest pain.  Skin: Negative for rash.  Psychiatric/Behavioral: Positive for substance abuse. Negative for depression, hallucinations and suicidal ideas. The patient is nervous/anxious.     Blood pressure 133/84, pulse 77, temperature 97.9 F (36.6 C), temperature source Oral, resp. rate 20, height 5\' 9"  (1.753 m), weight 73.5 kg, SpO2 99 %.Body mass index is 23.92 kg/m.  General Appearance: Fairly Groomed  Eye Contact:  Good  Speech:  Clear and Coherent  Volume:  Increased  Mood:  Angry and Euthymic  Affect:  Labile  Thought Process:  Coherent  Orientation:  Full (Time, Place, and Person)  Thought Content:  Rumination  Suicidal Thoughts:  No  Homicidal Thoughts:  No  Memory:  Recent;   Fair  Judgement:  Intact  Insight:  Lacking  Psychomotor Activity:  Normal  Concentration:  Concentration: Fair  Recall:  of Knowledge:  Fair  Language:  Fair  Akathisia:  No  Handed:  Right  AIMS (if indicated):     Assets:  Leisure Time Physical Health Transportation  ADL's:  Intact  Cognition:  WNL  Sleep:  Treatment Plan Summary:  Patient is a 58 year old male with a past history of personality disorder who presented to the ED looking for narcotics.  Upon learning that  he would not be receiving his prescription patient then asked to speak to psychiatry.  Throughout the course of the evaluation patient was making statements claiming that his life was in danger and expressing his desire to live.  However upon being told that his problem was more of a social issue and would not require psychiatric bed patient began to change his tone become hostile and states that he would slit his wrists if he did not get a bed.  These kind of conditional statements are typical of malingering patients.  Patient in this case was clearly drug-seeking as well as looking for a place to stay due to his dislike of his current living situation.  Patient was agitated upon learning that he would not be giving what he wanted.  Throughout the interview there was no evidence of any suicidal ideation or psychosis.  Patient does not meet criteria for admission.  Patient is for discharge  Diagnosis: antisocial personality disorder, malingering    Disposition: Patient does not meet criteria for psychiatric inpatient admission.  Clement Sayres, MD 11/01/2018 11:52 AM

## 2018-11-01 NOTE — ED Notes (Signed)
Up to bathroom  Upon returning from bathroom  States he needed to go outside

## 2018-11-01 NOTE — ED Notes (Signed)
Patient refuses discharge vitals.

## 2018-11-01 NOTE — ED Notes (Signed)
Pt was found sleeping in the [place] states he was waiting to be seen. Pt was discharged from the system around 215am.

## 2018-11-01 NOTE — ED Provider Notes (Signed)
[facility] Emergency Department Provider Note ____________________________________________  Time seen: 0829  I have reviewed the triage vital signs and the nursing notes.  HISTORY  Chief Complaint  Leg Pain and Back Pain  HPI Shane [name] is a 58 y.o. male is a patient known to the ED, he presents with complaints of pain to his low back and right leg.  Patient denies any recent injury, accident, trauma, or fall.  He also denies any bladder or bowel incontinence, foot drop, or saddle anesthesias.  He is under the care of [facility] and is supposed to have a MRI performed of the T spine and L spine.  He apparently has not called to reschedule the appointment after his authorization had expired, but has been updated.  He presents today for evaluation of his pain and request for pain medicine.  During his interview, the patient requested to talk to a psychiatrist.  When inquiring as to the nature of his request, the patient described that he lives in a hotel, and there is drug activity occurring around him.  Patient describes he feels unsafe in his current living situation.  I again asked if he would be better served by speaking to a Engineer, petroleum or social worker for evaluation of other living arrangements.  Patient replied by saying that he told his brother (who is a Social research officer, government in [place]),  patient remarked that after he told his brother what was going on, his brother said he "needed to talk to a psychiatrist."  Patient states that he feels like people are watching him, he reports that he finds himself up in the early hours looking through his blinds.  He denies any suicidal or homicidal ideations.  He also denied any hallucinations or illicit drug use.  Past Medical History:  Diagnosis Date  . Anemia   . Anxiety   . Drug-seeking behavior    hx of narcotics dependence, previously discharged from pain management clinic  . Encounter for blood transfusion  2005, 02/2014  . GERD (gastroesophageal reflux disease)   . Hemorrhoid   . Herniated cervical disc     Patient Active Problem List   Diagnosis Date Noted  . Antisocial personality disorder ([place])   . Malingering   . Hemorrhoid 05/09/2014  . Skin abnormality 05/09/2014    Past Surgical History:  Procedure Laterality Date  . CERVICAL FUSION    . COLONOSCOPY  2/16   Dr. Vira Agar  . FRACTURE SURGERY Left    fractured femur  . prolapsed rectum surgery  04/30/1995   [facility], trans abdominal mesh repair per patient report  . SEPTOPLASTY      Prior to Admission medications   Medication Sig Start Date End Date Taking? Authorizing Provider  cyclobenzaprine (FLEXERIL) 10 MG tablet Take 1 tablet by mouth 2 (two) times daily as needed. 09/04/16   [provider]  docusate sodium (COLACE) 100 MG capsule Take 250 mg by mouth 2 (two) times daily.     [provider]  Ferrous Sulfate (IRON) 325 (65 FE) MG TABS Take 1 tablet by mouth daily.    [provider]  fluticasone (FLONASE) 50 MCG/ACT nasal spray Place 1 spray into both nostrils daily.  04/20/14   [provider]  gabapentin (NEURONTIN) 100 MG capsule Take 1 capsule by mouth 3 (three) times daily. 10/02/16   [provider]  glycerin adult 2 g suppository Place 1 suppository rectally as needed for constipation. 09/25/17   Malinda,  Bebe ShaggyPaul F, MD  hydrocortisone (ANUSOL-HC) 25 MG suppository Place 1 suppository (25 mg total) rectally 2 (two) times daily. 05/22/14   Earline MayotteByrnett, Jeffrey W, MD  hydrocortisone (PROCTOSOL HC) 2.5 % rectal cream Place 1 application rectally 2 (two) times daily. 02/27/17   Emily FilbertWilliams, Jonathan E, MD  hydrocortisone 2.5 % cream Apply 1 application topically daily. 11/30/16   [provider]  hydrocortisone-pramoxine (PROCTOFOAM HC) rectal foam Place 1 applicator rectally 2 (two) times daily. 12/22/16   Loleta RoseForbach, Cory, MD  hydrOXYzine (ATARAX/VISTARIL) 10 MG tablet Take 1  tablet (10 mg total) by mouth 3 (three) times daily as needed for itching. 04/12/16   Schaevitz, Myra Rudeavid Matthew, MD  lidocaine (LIDODERM) 5 % Place 1 patch onto the skin every 12 (twelve) hours. Remove & Discard patch within 12 hours or as directed by MD 10/28/18 10/28/19  Darci CurrentBrown, Edmund N, MD  methocarbamol (ROBAXIN) 750 MG tablet Take 1 tablet (750 mg total) by mouth every 8 (eight) hours as needed for muscle spasms. 10/22/18   Darci CurrentBrown, Rhodes N, MD  omeprazole (PRILOSEC) 20 MG capsule Take 20 mg by mouth daily.  04/16/14   [provider]  pantoprazole (PROTONIX) 40 MG tablet Take 40 mg by mouth daily. 11/30/16   [provider]  predniSONE (DELTASONE) 20 MG tablet Take 2 tablets (40 mg total) by mouth daily with breakfast for 5 days. 11/01/18 11/06/18  Shaima Sardinas, Charlesetta IvoryJenise V Bacon, PA-C  terbinafine (LAMISIL) 250 MG tablet Take 250 mg by mouth daily. 08/11/16   [provider]  traMADol (ULTRAM) 50 MG tablet Take 1 tablet (50 mg total) by mouth 3 (three) times daily as needed for up to 3 days. 11/01/18 11/04/18  Toyoko Silos, Charlesetta IvoryJenise V Bacon, PA-C    Allergies Codeine and Amoxicillin  No family history on file.  Social History Social History   Tobacco Use  . Smoking status: Former Smoker    Years: 20.00    Quit date: 01/05/2005    Years since quitting: 13.8  . Smokeless tobacco: Current User  Substance Use Topics  . Alcohol use: Yes    Alcohol/week: 0.0 standard drinks  . Drug use: No    Review of Systems  Constitutional: Negative for fever. Cardiovascular: Negative for chest pain. Respiratory: Negative for shortness of breath. Gastrointestinal: Negative for abdominal pain, vomiting and diarrhea. Genitourinary: Negative for dysuria. Musculoskeletal: Positive for chronic low back pain. Skin: Negative for rash. Neurological: Negative for headaches, focal weakness or numbness. ____________________________________________  PHYSICAL EXAM:  VITAL SIGNS: ED Triage  Vitals [11/01/18 0742]  Enc Vitals Group     BP 133/84     Pulse Rate 77     Resp 20     Temp 97.9 F (36.6 C)     Temp Source Oral     SpO2 99 %     Weight 162 lb (73.5 kg)     Height 5\' 9"  (1.753 m)     Head Circumference      Peak Flow      Pain Score 9     Pain Loc      Pain Edu?      Excl. in GC?     Constitutional: Alert and oriented. Well appearing and in no distress. Head: Normocephalic and atraumatic. Eyes: Conjunctivae are normal. Normal extraocular movements Neck: Supple. No thyromegaly. Cardiovascular: Normal rate, regular rhythm. Normal distal pulses. Respiratory: Normal respiratory effort. No wheezes/rales/rhonchi. Gastrointestinal: Soft and nontender. No distention. Musculoskeletal: Nontender with normal range of motion in all extremities.  Neurologic:  Normal gait without ataxia. Normal speech and language. No gross focal neurologic deficits are appreciated. Skin:  Skin is warm, dry and intact. No rash noted. Psychiatric: Mood and affect are normal. Patient exhibits appropriate insight and judgment. ____________________________________________   LABS (pertinent positives/negatives) Labs Reviewed  SARS CORONAVIRUS 2 (TAT 6-24 HRS)  ____________________________________________  PROCEDURES  Prednisone  Procedures ____________________________________________  INITIAL IMPRESSION / ASSESSMENT AND PLAN / ED COURSE  Patient with ED evaluation of chronic pain to the low back and right leg consistent with his sciatica and underlying DDD.  Patient refused ketorolac injection here in the ED.  He will be given a oral dose of prednisone while he awaits consults.  Patient also requested Covid testing during his wait, denied any current symptoms or high risk exposure.  At his request a consult was placed for psych, and a social work consult was placed because the patient verbalized living in a local hotel that he feels is  unsafe.  ----------------------------------------- 11:03 AM on 11/01/2018 -----------------------------------------  Dr. Ellis Savage ([facility]) present at bedside to evaluate patient in the ED.   Patient is discharged at this time to follow-up with primary provider or his Ortho provider for further management.  Social work did consult the patient and provide him with resources for other living arrangements.  Psych is discharged patient at this time as he does not meet criteria for involuntary commitment.  See separate consult note for details of the visit.  Patient is discharged with a prescription for prednisone and Ultram (#10) for inflammation and pain relief.   Shane [name] was evaluated in Emergency Department on 11/01/2018 for the symptoms described in the history of present illness. He was evaluated in the context of the global COVID-19 pandemic, which necessitated consideration that the patient might be at risk for infection with the SARS-CoV-2 virus that causes COVID-19. Institutional protocols and algorithms that pertain to the evaluation of patients at risk for COVID-19 are in a state of rapid change based on information released by regulatory bodies including the CDC and federal and state organizations. These policies and algorithms were followed during the patient's care in the ED. ____________________________________________  FINAL CLINICAL IMPRESSION(S) / ED DIAGNOSES  Final diagnoses:  Sciatica of right side  Chronic right-sided low back pain with right-sided sciatica  Psychiatric complaint      Lissa Hoard, PA-C 11/01/18 1224    Shaune Pollack, MD 11/01/18 2011

## 2018-11-01 NOTE — ED Notes (Signed)
See triage note  Presents with back pain and leg pain    States this is chronic issue

## 2018-11-01 NOTE — ED Notes (Signed)
Psychiatry at bedside.

## 2018-11-01 NOTE — ED Notes (Signed)
Provider in with pt

## 2018-11-01 NOTE — Social Work (Signed)
TTS shared with patient resources provided by CSW on housing and shelter in [place].   CSW signing off. Please consult if any social work needs arise.     [place], [place] ED  [phone]

## 2018-11-01 NOTE — Discharge Instructions (Signed)
Follow-up with Dr. Wynn Banker for continued care.

## 2018-11-05 ENCOUNTER — Encounter: Payer: Self-pay | Admitting: Emergency Medicine

## 2018-11-05 ENCOUNTER — Other Ambulatory Visit: Payer: Self-pay

## 2018-11-05 DIAGNOSIS — Z79899 Other long term (current) drug therapy: Secondary | ICD-10-CM | POA: Insufficient documentation

## 2018-11-05 DIAGNOSIS — Z87891 Personal history of nicotine dependence: Secondary | ICD-10-CM | POA: Insufficient documentation

## 2018-11-05 DIAGNOSIS — M5441 Lumbago with sciatica, right side: Secondary | ICD-10-CM | POA: Insufficient documentation

## 2018-11-05 DIAGNOSIS — G8929 Other chronic pain: Secondary | ICD-10-CM | POA: Insufficient documentation

## 2018-11-05 DIAGNOSIS — M545 Low back pain: Secondary | ICD-10-CM | POA: Diagnosis present

## 2018-11-05 NOTE — ED Triage Notes (Signed)
Pt reports right leg pain and lower back pain for 5 years or more; pt has been seen here 12 times this month; pt says last time he was here he was prescribed prednisone but is now out; was feeling some better with the prednisone

## 2018-11-05 NOTE — ED Triage Notes (Signed)
FIRST NURSE NOTE: PT reports hx of pinched nerve from prolonged surgery in 2017, pt states he gets leg pain and numbness, states he was recently prescribed prednisone which helps but states he ran out. Pt ambulatory in lobby with limp, declined wheelchair.

## 2018-11-06 ENCOUNTER — Emergency Department
Admission: EM | Admit: 2018-11-06 | Discharge: 2018-11-06 | Disposition: A | Discharge status: Home / Self Care | Payer: Medicaid Other | Attending: Emergency Medicine | Admitting: Emergency Medicine

## 2018-11-06 DIAGNOSIS — G8929 Other chronic pain: Secondary | ICD-10-CM

## 2018-11-06 MED ORDER — LIDOCAINE 5 % EX PTCH
2.0000 | MEDICATED_PATCH | CUTANEOUS | Status: DC
  Administered 2018-11-06: 2 via TRANSDERMAL
  Filled 2018-11-06: qty 2

## 2018-11-06 NOTE — ED Notes (Signed)
Pt verbalized understanding of d/c instructions and follow up 

## 2018-11-06 NOTE — ED Notes (Signed)
Pt in requesting prednisone and medication for pain. MD in room to assess patient and explain the importance of following up with the pain clinic as referred to ensure ongoing care. Pt c/o pain in legs, states he has sciatica.

## 2018-11-06 NOTE — ED Provider Notes (Signed)
[facility] Emergency Department Provider Note _________   First MD Initiated Contact with Patient 11/06/18 [phone]     (approximate)  I have reviewed the triage vital signs and the nursing notes.   HISTORY  Chief Complaint Leg Pain and Back Pain    HPI DAE HIGHLEY is a 58 y.o. male with below list of previous medical conditions including chronic back pain and drug-seeking behavior presents to the emergency department secondary to low back pain with radiation down the posterior right leg.  Patient denies any urinary or bowel incontinence.  Patient denies any fever.  Patient has been seen 13 times in the emergency department in September secondary to the same.  Patient has been referred to neurosurgery and chronic pain however has not seen either.     Past Medical History:  Diagnosis Date  . Anemia   . Anxiety   . Drug-seeking behavior    hx of narcotics dependence, previously discharged from pain management clinic  . Encounter for blood transfusion 2005, 02/2014  . GERD (gastroesophageal reflux disease)   . Hemorrhoid   . Herniated cervical disc     Patient Active Problem List   Diagnosis Date Noted  . Antisocial personality disorder ([place])   . Malingering   . Hemorrhoid 05/09/2014  . Skin abnormality 05/09/2014    Past Surgical History:  Procedure Laterality Date  . CERVICAL FUSION    . COLONOSCOPY  2/16   Dr. Vira Agar  . FRACTURE SURGERY Left    fractured femur  . prolapsed rectum surgery  04/30/1995   [facility], trans abdominal mesh repair per patient report  . SEPTOPLASTY      Prior to Admission medications   Medication Sig Start Date End Date Taking? Authorizing Provider  cyclobenzaprine (FLEXERIL) 10 MG tablet Take 1 tablet by mouth 2 (two) times daily as needed. 09/04/16   [provider]  docusate sodium (COLACE) 100 MG capsule Take 250 mg by mouth 2 (two) times daily.     [provider]  Ferrous Sulfate  (IRON) 325 (65 FE) MG TABS Take 1 tablet by mouth daily.    [provider]  fluticasone (FLONASE) 50 MCG/ACT nasal spray Place 1 spray into both nostrils daily.  04/20/14   [provider]  gabapentin (NEURONTIN) 100 MG capsule Take 1 capsule by mouth 3 (three) times daily. 10/02/16   [provider]  glycerin adult 2 g suppository Place 1 suppository rectally as needed for constipation. 09/25/17   Nena Polio, MD  hydrocortisone (ANUSOL-HC) 25 MG suppository Place 1 suppository (25 mg total) rectally 2 (two) times daily. 05/22/14   Robert Bellow, MD  hydrocortisone (PROCTOSOL HC) 2.5 % rectal cream Place 1 application rectally 2 (two) times daily. 02/27/17   Earleen Newport, MD  hydrocortisone 2.5 % cream Apply 1 application topically daily. 11/30/16   [provider]  hydrocortisone-pramoxine (PROCTOFOAM HC) rectal foam Place 1 applicator rectally 2 (two) times daily. 12/22/16   Hinda Kehr, MD  hydrOXYzine (ATARAX/VISTARIL) 10 MG tablet Take 1 tablet (10 mg total) by mouth 3 (three) times daily as needed for itching. 04/12/16   Schaevitz, Randall An, MD  lidocaine (LIDODERM) 5 % Place 1 patch onto the skin every 12 (twelve) hours. Remove & Discard patch within 12 hours or as directed by MD 10/28/18 10/28/19  Gregor Hams, MD  methocarbamol (ROBAXIN) 750 MG tablet Take 1 tablet (750 mg total) by mouth every 8 (eight) hours as  needed for muscle spasms. 10/22/18   Darci Current, MD  omeprazole (PRILOSEC) 20 MG capsule Take 20 mg by mouth daily.  04/16/14   [provider]  pantoprazole (PROTONIX) 40 MG tablet Take 40 mg by mouth daily. 11/30/16   [provider]  predniSONE (DELTASONE) 20 MG tablet Take 2 tablets (40 mg total) by mouth daily with breakfast for 5 days. 11/01/18 11/06/18  Menshew, Charlesetta Ivory, PA-C  terbinafine (LAMISIL) 250 MG tablet Take 250 mg by mouth daily. 08/11/16   [provider]    Allergies  Codeine and Amoxicillin  History reviewed. No pertinent family history.  Social History Social History   Tobacco Use  . Smoking status: Former Smoker    Years: 20.00    Quit date: 01/05/2005    Years since quitting: 13.8  . Smokeless tobacco: Current User  Substance Use Topics  . Alcohol use: Yes    Alcohol/week: 0.0 standard drinks  . Drug use: No    Review of Systems Constitutional: No fever/chills Eyes: No visual changes. ENT: No sore throat. Cardiovascular: Denies chest pain. Respiratory: Denies shortness of breath. Gastrointestinal: No abdominal pain.  No nausea, no vomiting.  No diarrhea.  No constipation. Genitourinary: Negative for dysuria. Musculoskeletal: Negative for neck pain.  Positive for back pain. Integumentary: Negative for rash. Neurological: Negative for headaches, focal weakness or numbness.   ____________________________________________   PHYSICAL EXAM:  VITAL SIGNS: ED Triage Vitals  Enc Vitals Group     BP 11/05/18 2156 124/73     Pulse Rate 11/05/18 2156 (!) 110     Resp --      Temp 11/05/18 2156 98.6 F (37 C)     Temp Source 11/05/18 2156 Oral     SpO2 11/05/18 2156 97 %     Weight 11/05/18 2156 73.5 kg (162 lb)     Height 11/05/18 2156 1.753 m (5\' 9" )     Head Circumference --      Peak Flow --      Pain Score 11/05/18 2207 10     Pain Loc --      Pain Edu? --      Excl. in GC? --     Constitutional: Alert and oriented.  Eyes: Conjunctivae are normal.  Mouth/Throat: Patient is wearing a mask. Neck: No stridor.  No meningeal signs.   Musculoskeletal: No lower extremity tenderness nor edema. No gross deformities of extremities.  Ambulating without apparent difficulty Neurologic:  Normal speech and language. No gross focal neurologic deficits are appreciated.  Skin:  Skin is warm, dry and intact. Psychiatric: Mood and affect are normal. Speech and behavior are normal.  ____________________________________________  Procedures    ____________________________________________   INITIAL IMPRESSION / MDM / ASSESSMENT AND PLAN / ED COURSE  As part of my medical decision making, I reviewed the following data within the electronic MEDICAL RECORD NUMBER  58 year old male presented with above-stated history and physical exam secondary to low back pain with radiation down the posterior right leg.  Patient will be given Lidoderm patches in the emergency department.  Patient request to be referred to [facility] clinic     ____________________________________________  FINAL CLINICAL IMPRESSION(S) / ED DIAGNOSES  Final diagnoses:  Chronic right-sided low back pain with right-sided sciatica     MEDICATIONS GIVEN DURING THIS VISIT:  Medications  lidocaine (LIDODERM) 5 % 2 patch (has no administration in time range)     ED Discharge Orders    None      *  Please note:  Evalee Muttonimothy A Husby was evaluated in Emergency Department on 11/06/2018 for the symptoms described in the history of present illness. He was evaluated in the context of the global COVID-19 pandemic, which necessitated consideration that the patient might be at risk for infection with the SARS-CoV-2 virus that causes COVID-19. Institutional protocols and algorithms that pertain to the evaluation of patients at risk for COVID-19 are in a state of rapid change based on information released by regulatory bodies including the CDC and federal and state organizations. These policies and algorithms were followed during the patient's care in the ED.  Some ED evaluations and interventions may be delayed as a result of limited staffing during the pandemic.*  Note:  This document was prepared using Dragon voice recognition software and may include unintentional dictation errors.   Darci CurrentBrown, Candelaria Arenas N, MD 11/06/18 [phone]

## 2018-11-09 ENCOUNTER — Encounter: Payer: Self-pay | Admitting: Emergency Medicine

## 2018-11-09 ENCOUNTER — Other Ambulatory Visit: Payer: Self-pay

## 2018-11-09 DIAGNOSIS — Z87891 Personal history of nicotine dependence: Secondary | ICD-10-CM | POA: Insufficient documentation

## 2018-11-09 DIAGNOSIS — G8929 Other chronic pain: Secondary | ICD-10-CM | POA: Insufficient documentation

## 2018-11-09 DIAGNOSIS — M545 Low back pain: Secondary | ICD-10-CM | POA: Diagnosis present

## 2018-11-09 DIAGNOSIS — M5441 Lumbago with sciatica, right side: Secondary | ICD-10-CM | POA: Insufficient documentation

## 2018-11-09 DIAGNOSIS — Z79899 Other long term (current) drug therapy: Secondary | ICD-10-CM | POA: Insufficient documentation

## 2018-11-09 NOTE — ED Triage Notes (Signed)
Patient ambulatory to triage with steady gait, without difficulty or distress noted, mask in place; pt st he was bending over to pick up a drink in the store and began having lower back pain; pt has been seen multiple times for same

## 2018-11-10 ENCOUNTER — Emergency Department
Admission: EM | Admit: 2018-11-10 | Discharge: 2018-11-10 | Disposition: A | Discharge status: Home / Self Care | Payer: Medicaid Other | Attending: Emergency Medicine | Admitting: Emergency Medicine

## 2018-11-10 DIAGNOSIS — M5441 Lumbago with sciatica, right side: Secondary | ICD-10-CM

## 2018-11-10 DIAGNOSIS — G8929 Other chronic pain: Secondary | ICD-10-CM

## 2018-11-10 MED ORDER — KETOROLAC TROMETHAMINE 30 MG/ML IJ SOLN: 30.0000 mg | Freq: Once | INTRAMUSCULAR | Status: DC

## 2018-11-10 NOTE — Discharge Instructions (Signed)
I encourage you to follow-up with your doctor and get a referral to the pain clinic.  You may also follow-up with your back surgeon.  You were given a copy of the chronic pain discharge instructions.  Return to the ER for worsening acute changes, bowel or bladder incontinence, leg weakness or other concerns.

## 2018-11-10 NOTE — ED Notes (Signed)
Pt refusing pain medication at this time. Pt states "It's my right to refuse care, I'm going to wait in the lobby". Charge RN aware. Pt ambulatory to lobby in nad at time of d/c. Pt refusing to sign for d/c papers at this time or have vitals taken at time of d/c.

## 2018-11-10 NOTE — ED Notes (Signed)
Pt ambulatory to room in NAD

## 2018-11-10 NOTE — ED Provider Notes (Signed)
[facility] Emergency Department Provider Note   ____________________________________________   First MD Initiated Contact with Patient 11/10/18 [phone]     (approximate)  I have reviewed the triage vital signs and the nursing notes.   HISTORY  Chief Complaint Back Pain    HPI Shane [name] is a 58 y.o. male who presents to the ED from home with a chief complaint of back pain.  Patient has a history of chronic back pain.  This is patient's 12th visit to this ED since September 25.  Reports he picked up some drinks in the store and began having lower back pain radiating to his right leg.  Symptoms symptoms previously.  States he has not followed up with [facility] clinic as previously instructed.  He has also not followed up with his back surgeon.  Denies extremity weakness, bowel or bladder incontinence.  Denies fever, cough, chest pain, shortness of breath, abdominal pain, nausea or vomiting.       Past Medical History:  Diagnosis Date  . Anemia   . Anxiety   . Drug-seeking behavior    hx of narcotics dependence, previously discharged from pain management clinic  . Encounter for blood transfusion 2005, 02/2014  . GERD (gastroesophageal reflux disease)   . Hemorrhoid   . Herniated cervical disc     Patient Active Problem List   Diagnosis Date Noted  . Antisocial personality disorder ([facility])   . Malingering   . Hemorrhoid 05/09/2014  . Skin abnormality 05/09/2014    Past Surgical History:  Procedure Laterality Date  . CERVICAL FUSION    . COLONOSCOPY  2/16   Dr. Mechele Collin  . FRACTURE SURGERY Left    fractured femur  . prolapsed rectum surgery  04/30/1995   [facility], trans abdominal mesh repair per patient report  . SEPTOPLASTY      Prior to Admission medications   Medication Sig Start Date End Date Taking? Authorizing Provider  cyclobenzaprine (FLEXERIL) 10 MG tablet Take 1 tablet by mouth 2 (two) times daily as needed. 09/04/16    [provider]  docusate sodium (COLACE) 100 MG capsule Take 250 mg by mouth 2 (two) times daily.     [provider]  Ferrous Sulfate (IRON) 325 (65 FE) MG TABS Take 1 tablet by mouth daily.    [provider]  fluticasone (FLONASE) 50 MCG/ACT nasal spray Place 1 spray into both nostrils daily.  04/20/14   [provider]  gabapentin (NEURONTIN) 100 MG capsule Take 1 capsule by mouth 3 (three) times daily. 10/02/16   [provider]  glycerin adult 2 g suppository Place 1 suppository rectally as needed for constipation. 09/25/17   Arnaldo Natal, MD  hydrocortisone (ANUSOL-HC) 25 MG suppository Place 1 suppository (25 mg total) rectally 2 (two) times daily. 05/22/14   Earline Mayotte, MD  hydrocortisone (PROCTOSOL HC) 2.5 % rectal cream Place 1 application rectally 2 (two) times daily. 02/27/17   Emily Filbert, MD  hydrocortisone 2.5 % cream Apply 1 application topically daily. 11/30/16   [provider]  hydrocortisone-pramoxine (PROCTOFOAM HC) rectal foam Place 1 applicator rectally 2 (two) times daily. 12/22/16   Loleta Rose, MD  hydrOXYzine (ATARAX/VISTARIL) 10 MG tablet Take 1 tablet (10 mg total) by mouth 3 (three) times daily as needed for itching. 04/12/16   Schaevitz, Myra Rude, MD  lidocaine (LIDODERM) 5 % Place 1 patch onto the skin every 12 (twelve) hours. Remove & Discard patch within 12 hours  or as directed by MD 10/28/18 10/28/19  Darci CurrentBrown, [place] N, MD  methocarbamol (ROBAXIN) 750 MG tablet Take 1 tablet (750 mg total) by mouth every 8 (eight) hours as needed for muscle spasms. 10/22/18   Darci CurrentBrown, [place] N, MD  omeprazole (PRILOSEC) 20 MG capsule Take 20 mg by mouth daily.  04/16/14   [provider]  pantoprazole (PROTONIX) 40 MG tablet Take 40 mg by mouth daily. 11/30/16   [provider]  terbinafine (LAMISIL) 250 MG tablet Take 250 mg by mouth daily. 08/11/16   [provider]    Allergies  Codeine and Amoxicillin  No family history on file.  Social History Social History   Tobacco Use  . Smoking status: Former Smoker    Years: 20.00    Quit date: 01/05/2005    Years since quitting: 13.8  . Smokeless tobacco: Current User  Substance Use Topics  . Alcohol use: Yes    Alcohol/week: 0.0 standard drinks  . Drug use: No    Review of Systems  Constitutional: No fever/chills Eyes: No visual changes. ENT: No sore throat. Cardiovascular: Denies chest pain. Respiratory: Denies shortness of breath. Gastrointestinal: No abdominal pain.  No nausea, no vomiting.  No diarrhea.  No constipation. Genitourinary: Negative for dysuria. Musculoskeletal: Positive for back pain. Skin: Negative for rash. Neurological: Negative for headaches, focal weakness or numbness.   ____________________________________________   PHYSICAL EXAM:  VITAL SIGNS: ED Triage Vitals  Enc Vitals Group     BP 11/09/18 2320 120/78     Pulse Rate 11/09/18 2320 100     Resp 11/09/18 2320 18     Temp 11/09/18 2320 98.8 F (37.1 C)     Temp Source 11/09/18 2320 Oral     SpO2 11/09/18 2320 98 %     Weight 11/09/18 2320 163 lb 2.3 oz (74 kg)     Height 11/09/18 2320 5\' 9"  (1.753 m)     Head Circumference --      Peak Flow --      Pain Score 11/09/18 2321 8     Pain Loc --      Pain Edu? --      Excl. in GC? --     Constitutional: Alert and oriented. Well appearing and in no acute distress. Eyes: Conjunctivae are normal. PERRL. EOMI. Head: Atraumatic. Nose: No congestion/rhinnorhea. Mouth/Throat: Mucous membranes are moist.  Oropharynx non-erythematous. Neck: No stridor.  No cervical spine tenderness to palpation. Cardiovascular: Normal rate, regular rhythm. Grossly normal heart sounds.  Good peripheral circulation. Respiratory: Normal respiratory effort.  No retractions. Lungs CTAB. Gastrointestinal: Soft and nontender. No distention. No abdominal bruits. No CVA tenderness. Musculoskeletal:  No spinal tenderness to palpation.  Right lumbar paraspinal muscle spasms.  Negative straight leg raise bilaterally.  No lower extremity tenderness nor edema.  No joint effusions. Neurologic:  Normal speech and language. No gross focal neurologic deficits are appreciated. No gait instability. Skin:  Skin is warm, dry and intact. No rash noted. Psychiatric: Mood and affect are normal. Speech and behavior are normal.  ____________________________________________   LABS (all labs ordered are listed, but only abnormal results are displayed)  Labs Reviewed - No data to display ____________________________________________  EKG  None ____________________________________________  RADIOLOGY  ED MD interpretation: None  Official radiology report(s): No results found.  ____________________________________________   PROCEDURES  Procedure(s) performed (including Critical Care):  Procedures   ____________________________________________   INITIAL IMPRESSION / ASSESSMENT AND PLAN / ED COURSE  As part of my  medical decision making, I reviewed the following data within the [place] notes reviewed and incorporated, Old chart reviewed, Notes from prior ED visits and [place] Controlled Substance Database     Shane [name] was evaluated in Emergency Department on 11/10/2018 for the symptoms described in the history of present illness. He was evaluated in the context of the global COVID-19 pandemic, which necessitated consideration that the patient might be at risk for infection with the SARS-CoV-2 virus that causes COVID-19. Institutional protocols and algorithms that pertain to the evaluation of patients at risk for COVID-19 are in a state of rapid change based on information released by regulatory bodies including the CDC and federal and state organizations. These policies and algorithms were followed during the patient's care in the ED.    58 year old male who  presents to the ED for acute on chronic back pain.  This is patient's 12th ED visit since September 25.  He has no focal neurological deficits on examination.  We had a frank discussion about our chronic pain policy in this emergency department.  He was given a copy of this.  I strongly recommended that he follow-up with his back surgeon or his PCP for pain clinic referral.  I did offer him muscle relaxer but he states he already takes "the strongest one".  Also has lidocaine patches.  Will administer dose of IM Toradol.  Strict return precautions given.  Patient verbalizes understanding agrees with plan of care.      ____________________________________________   FINAL CLINICAL IMPRESSION(S) / ED DIAGNOSES  Final diagnoses:  Chronic right-sided low back pain with right-sided sciatica     ED Discharge Orders    None       Note:  This document was prepared using Dragon voice recognition software and may include unintentional dictation errors.   Paulette Blanch, MD 11/10/18 [phone]

## 2018-11-12 ENCOUNTER — Encounter: Payer: Self-pay | Admitting: Emergency Medicine

## 2018-11-12 ENCOUNTER — Emergency Department
Admission: EM | Admit: 2018-11-12 | Discharge: 2018-11-12 | Disposition: A | Discharge status: Home / Self Care | Payer: Medicaid Other | Attending: Emergency Medicine | Admitting: Emergency Medicine

## 2018-11-12 ENCOUNTER — Other Ambulatory Visit: Payer: Self-pay

## 2018-11-12 ENCOUNTER — Emergency Department: Payer: Medicaid Other

## 2018-11-12 DIAGNOSIS — W11XXXA Fall on and from ladder, initial encounter: Secondary | ICD-10-CM | POA: Insufficient documentation

## 2018-11-12 DIAGNOSIS — G8929 Other chronic pain: Secondary | ICD-10-CM | POA: Insufficient documentation

## 2018-11-12 DIAGNOSIS — Z87891 Personal history of nicotine dependence: Secondary | ICD-10-CM | POA: Insufficient documentation

## 2018-11-12 DIAGNOSIS — Z7722 Contact with and (suspected) exposure to environmental tobacco smoke (acute) (chronic): Secondary | ICD-10-CM | POA: Diagnosis not present

## 2018-11-12 DIAGNOSIS — S93401A Sprain of unspecified ligament of right ankle, initial encounter: Secondary | ICD-10-CM | POA: Insufficient documentation

## 2018-11-12 DIAGNOSIS — Z79899 Other long term (current) drug therapy: Secondary | ICD-10-CM | POA: Insufficient documentation

## 2018-11-12 DIAGNOSIS — Y999 Unspecified external cause status: Secondary | ICD-10-CM | POA: Insufficient documentation

## 2018-11-12 DIAGNOSIS — Z042 Encounter for examination and observation following work accident: Secondary | ICD-10-CM | POA: Insufficient documentation

## 2018-11-12 DIAGNOSIS — Y9389 Activity, other specified: Secondary | ICD-10-CM | POA: Insufficient documentation

## 2018-11-12 DIAGNOSIS — M5441 Lumbago with sciatica, right side: Secondary | ICD-10-CM | POA: Insufficient documentation

## 2018-11-12 DIAGNOSIS — Y929 Unspecified place or not applicable: Secondary | ICD-10-CM | POA: Insufficient documentation

## 2018-11-12 NOTE — ED Triage Notes (Signed)
Patient presents again today because he left yesterday before being seen. States he is here for the same thing. Asked what that is he states leg pain and back pain. Patient ambulatory to triage with steady gait.

## 2018-11-12 NOTE — ED Notes (Signed)
Pt not seen in lobby. Will call again when next bed available.

## 2018-11-12 NOTE — ED Notes (Signed)
Pt called 3 times in the lobby no answer. 

## 2018-11-12 NOTE — ED Triage Notes (Addendum)
Patient states that he was at work and started to slip off a ladder. Patient states that he did not fall that he caught himself  but he twisted his back and right ankle. Patient states that he does not want to file workers compensation.

## 2018-11-12 NOTE — ED Notes (Signed)
Pt to the desk to notify staff he is here.

## 2018-11-13 ENCOUNTER — Emergency Department
Admission: EM | Admit: 2018-11-13 | Discharge: 2018-11-13 | Disposition: A | Discharge status: Home / Self Care | Payer: Medicaid Other | Source: Home / Self Care | Attending: Emergency Medicine | Admitting: Emergency Medicine

## 2018-11-13 DIAGNOSIS — M5441 Lumbago with sciatica, right side: Secondary | ICD-10-CM

## 2018-11-13 DIAGNOSIS — S93401A Sprain of unspecified ligament of right ankle, initial encounter: Secondary | ICD-10-CM

## 2018-11-13 DIAGNOSIS — G8929 Other chronic pain: Secondary | ICD-10-CM

## 2018-11-13 MED ORDER — GABAPENTIN 100 MG PO CAPS: 100.0000 mg | ORAL_CAPSULE | Freq: Three times a day (TID) | ORAL | 2 refills | Status: AC

## 2018-11-13 MED ORDER — METHOCARBAMOL 750 MG PO TABS: 750.0000 mg | ORAL_TABLET | Freq: Three times a day (TID) | ORAL | 0 refills | Status: DC | PRN

## 2018-11-13 MED ORDER — KETOROLAC TROMETHAMINE 60 MG/2ML IM SOLN: 60.0000 mg | Freq: Once | INTRAMUSCULAR | Status: DC

## 2018-11-13 MED ORDER — ACETAMINOPHEN 500 MG PO TABS: 1000.0000 mg | ORAL_TABLET | Freq: Once | ORAL | Status: DC

## 2018-11-13 MED ORDER — GABAPENTIN 100 MG PO CAPS
100.0000 mg | ORAL_CAPSULE | Freq: Once | ORAL | Status: DC
  Filled 2018-11-13: qty 1

## 2018-11-13 NOTE — ED Provider Notes (Signed)
[facility] Emergency Department Provider Note  ____________________________________________  Time seen: Approximately 3:19 AM  I have reviewed the triage vital signs and the nursing notes.   HISTORY  Chief Complaint Leg Pain and Back Pain   HPI Shane [name] is a 57 y.o. male the history of chronic pain, drug-seeking behavior, chronic back pain who presents for evaluation of right ankle pain and back pain.  Patient reports history of sciatica pain.  Yesterday he was coming down from a ladder when he slipped.  He did not fall but on the process of catching himself he twisted his right ankle.  He also exacerbated his sciatica pain.  He is complaining of right-sided sharp buttock pain radiating down to his right leg.  Is also complaining of pain on the medial aspect of his right ankle which is worse with weightbearing.  He was in the emergency department yesterday where x-rays were done but he left without being seen by physician.  He has been taking Tylenol at home with no relief.  He denies any other injuries.   Past Medical History:  Diagnosis Date   Anemia    Anxiety    Drug-seeking behavior    hx of narcotics dependence, previously discharged from pain management clinic   Encounter for blood transfusion 2005, 02/2014   GERD (gastroesophageal reflux disease)    Hemorrhoid    Herniated cervical disc     Patient Active Problem List   Diagnosis Date Noted   Antisocial personality disorder (HCC)    Malingering    Hemorrhoid 05/09/2014   Skin abnormality 05/09/2014    Past Surgical History:  Procedure Laterality Date   CERVICAL FUSION     COLONOSCOPY  2/16   Dr. Mechele Collin   FRACTURE SURGERY Left    fractured femur   prolapsed rectum surgery  04/30/1995   [facility], trans abdominal mesh repair per patient report   SEPTOPLASTY      Prior to Admission medications   Medication Sig Start Date End Date Taking? Authorizing Provider   cyclobenzaprine (FLEXERIL) 10 MG tablet Take 1 tablet by mouth 2 (two) times daily as needed. 09/04/16   [provider]  docusate sodium (COLACE) 100 MG capsule Take 250 mg by mouth 2 (two) times daily.     [provider]  Ferrous Sulfate (IRON) 325 (65 FE) MG TABS Take 1 tablet by mouth daily.    [provider]  fluticasone (FLONASE) 50 MCG/ACT nasal spray Place 1 spray into both nostrils daily.  04/20/14   [provider]  gabapentin (NEURONTIN) 100 MG capsule Take 1 capsule (100 mg total) by mouth 3 (three) times daily. 11/13/18 11/13/19  Nita Sickle, MD  glycerin adult 2 g suppository Place 1 suppository rectally as needed for constipation. 09/25/17   Arnaldo Natal, MD  hydrocortisone (ANUSOL-HC) 25 MG suppository Place 1 suppository (25 mg total) rectally 2 (two) times daily. 05/22/14   Earline Mayotte, MD  hydrocortisone (PROCTOSOL HC) 2.5 % rectal cream Place 1 application rectally 2 (two) times daily. 02/27/17   Emily Filbert, MD  hydrocortisone 2.5 % cream Apply 1 application topically daily. 11/30/16   [provider]  hydrocortisone-pramoxine (PROCTOFOAM HC) rectal foam Place 1 applicator rectally 2 (two) times daily. 12/22/16   Loleta Rose, MD  hydrOXYzine (ATARAX/VISTARIL) 10 MG tablet Take 1 tablet (10 mg total) by mouth 3 (three) times daily as needed for itching. 04/12/16   Schaevitz, Myra Rude, MD  lidocaine (  LIDODERM) 5 % Place 1 patch onto the skin every 12 (twelve) hours. Remove & Discard patch within 12 hours or as directed by MD 10/28/18 10/28/19  Gregor Hams, MD  methocarbamol (ROBAXIN) 750 MG tablet Take 1 tablet (750 mg total) by mouth every 8 (eight) hours as needed for muscle spasms. 11/13/18   Alfred Levins, [place], MD  omeprazole (PRILOSEC) 20 MG capsule Take 20 mg by mouth daily.  04/16/14   [provider]  pantoprazole (PROTONIX) 40 MG tablet Take 40 mg by mouth daily. 11/30/16   [provider]  terbinafine (LAMISIL) 250 MG tablet Take 250 mg by mouth daily. 08/11/16   [provider]    Allergies Codeine and Amoxicillin  No family history on file.  Social History Social History   Tobacco Use   Smoking status: Former Smoker    Years: 20.00    Quit date: 01/05/2005    Years since quitting: 13.8   Smokeless tobacco: Current User  Substance Use Topics   Alcohol use: Not Currently    Alcohol/week: 0.0 standard drinks   Drug use: No    Review of Systems  Constitutional: Negative for fever. Eyes: Negative for visual changes. ENT: Negative for sore throat. Neck: No neck pain  Cardiovascular: Negative for chest pain. Respiratory: Negative for shortness of breath. Gastrointestinal: Negative for abdominal pain, vomiting or diarrhea. Genitourinary: Negative for dysuria. Musculoskeletal: + back pain and R ankle pain Skin: Negative for rash. Neurological: Negative for headaches, weakness or numbness. Psych: No SI or HI  ____________________________________________   PHYSICAL EXAM:  VITAL SIGNS: ED Triage Vitals  Enc Vitals Group     BP 11/12/18 2326 127/81     Pulse Rate 11/12/18 2326 (!) 102     Resp 11/12/18 2326 18     Temp 11/12/18 2326 98.4 F (36.9 C)     Temp Source 11/12/18 2326 Oral     SpO2 --      Weight 11/12/18 2327 161 lb (73 kg)     Height 11/12/18 2327 5\' 10"  (1.778 m)     Head Circumference --      Peak Flow --      Pain Score 11/12/18 2326 9     Pain Loc --      Pain Edu? --      Excl. in [place]? --     Constitutional: Alert and oriented. Well appearing and in no apparent distress. HEENT:      Head: Normocephalic and atraumatic.         Eyes: Conjunctivae are normal. Sclera is non-icteric.       Mouth/Throat: Mucous membranes are moist.       Neck: Supple with no signs of meningismus. Cardiovascular: Regular rate and rhythm.  Respiratory: Normal respiratory effort.  Musculoskeletal: Patient is tender to  palpation on the medial malleolus on the right with no deformities, bruising, swelling, full range of motion of the ankle.  No CT and L-spine midline spine tenderness.  Strong Distal pulses, warm and well perfused extremities.  Neurologic: Normal speech and language. Face is symmetric.  Intact strength and sensation of bilateral lower extremities with 2+ patellar DTRs Skin: Skin is warm, dry and intact. No rash noted. Psychiatric: Mood and affect are normal. Speech and behavior are normal.  ____________________________________________   LABS (all labs ordered are listed, but only abnormal results are displayed)  Labs Reviewed - No data to display ____________________________________________  EKG  none  ____________________________________________  RADIOLOGY  none  ____________________________________________   PROCEDURES  Procedure(s) performed: None Procedures Critical Care performed:  None ____________________________________________   INITIAL IMPRESSION / ASSESSMENT AND PLAN / ED COURSE   58 y.o. male the history of chronic pain, drug-seeking behavior, chronic back pain who presents for evaluation of right ankle pain and back pain.  Patient with exacerbation of his chronic sciatica pain after slipping from a ladder.  No fall or trauma to the back.  Exam with no signs of cauda equina syndrome, no midline CT and L-spine tenderness.  This is patient's 13th visit to the emergency room in the last 6 weeks for the same exact complaint.  Also complaining of ankle pain after twisting his ankle yesterday.  X-rays were done yesterday of the foot, ankle, and tib-fib which were all unremarkable.  Patient is able to ambulate with no difficulty in the emergency department.  His neurovascular exam is intact.  Patient requesting Robaxin refill which was provided.  Offered Toradol IM, p.o. Tylenol, and p.o. gabapentin which patient refused in the emergency room.  Discussed yet again with the  patient that we are not going to manage his chronic pain and will not provide him with any narcotics.  He is again being referred to a chronic pain clinic.       As part of my medical decision making, I reviewed the following data within the electronic MEDICAL RECORD NUMBER Nursing notes reviewed and incorporated, Old chart reviewed, Radiograph reviewed , Notes from prior ED visits and [place] Controlled Substance Database   Patient was evaluated in Emergency Department today for the symptoms described in the history of present illness. Patient was evaluated in the context of the global COVID-19 pandemic, which necessitated consideration that the patient might be at risk for infection with the SARS-CoV-2 virus that causes COVID-19. Institutional protocols and algorithms that pertain to the evaluation of patients at risk for COVID-19 are in a state of rapid change based on information released by regulatory bodies including the CDC and federal and state organizations. These policies and algorithms were followed during the patient's care in the ED.   ____________________________________________   FINAL CLINICAL IMPRESSION(S) / ED DIAGNOSES   Final diagnoses:  Chronic right-sided low back pain with right-sided sciatica  Sprain of right ankle, unspecified ligament, initial encounter      NEW MEDICATIONS STARTED DURING THIS VISIT:  ED Discharge Orders         Ordered    gabapentin (NEURONTIN) 100 MG capsule  3 times daily     11/13/18 0316    methocarbamol (ROBAXIN) 750 MG tablet  Every 8 hours PRN     11/13/18 0319           Note:  This document was prepared using Dragon voice recognition software and may include unintentional dictation errors.    Nita SickleVeronese, [place], MD 11/13/18 [phone]

## 2018-11-18 ENCOUNTER — Other Ambulatory Visit: Payer: Self-pay

## 2018-11-18 DIAGNOSIS — Z87891 Personal history of nicotine dependence: Secondary | ICD-10-CM | POA: Diagnosis not present

## 2018-11-18 DIAGNOSIS — M5442 Lumbago with sciatica, left side: Secondary | ICD-10-CM | POA: Insufficient documentation

## 2018-11-18 DIAGNOSIS — M545 Low back pain: Secondary | ICD-10-CM | POA: Diagnosis present

## 2018-11-18 DIAGNOSIS — Z79899 Other long term (current) drug therapy: Secondary | ICD-10-CM | POA: Diagnosis not present

## 2018-11-19 ENCOUNTER — Emergency Department
Admission: EM | Admit: 2018-11-19 | Discharge: 2018-11-19 | Disposition: A | Discharge status: Home / Self Care | Payer: Medicaid Other | Attending: Emergency Medicine | Admitting: Emergency Medicine

## 2018-11-19 ENCOUNTER — Other Ambulatory Visit: Payer: Self-pay

## 2018-11-19 DIAGNOSIS — G8929 Other chronic pain: Secondary | ICD-10-CM

## 2018-11-19 MED ORDER — KETOROLAC TROMETHAMINE 30 MG/ML IJ SOLN
30.0000 mg | Freq: Once | INTRAMUSCULAR | Status: AC
  Administered 2018-11-19: 05:00:00 30 mg via INTRAMUSCULAR
  Filled 2018-11-19: qty 1

## 2018-11-19 NOTE — ED Triage Notes (Signed)
Pt arrives to ED via POV from home with c/o chronic back pain with radiation into his right leg. Pt seen here several times recently for same; denies following up as directed. Pt denies any new injury, falls, or trauma. Pt ia A&O, in NAD; RR even, regular, and unlabored.

## 2018-11-19 NOTE — ED Notes (Signed)
Pt refused to be placed in 5 hallway. Placed back in waiting room per pt request. Will place in room when available.

## 2018-11-19 NOTE — ED Provider Notes (Signed)
[facility] Emergency Department Provider Note   First MD Initiated Contact with Patient 11/19/18 [phone]     (approximate)  I have reviewed the triage vital signs and the nursing notes.   HISTORY  Chief Complaint Back Pain   HPI Shane [name] is a 58 y.o. male with below list of previous medical condition well-known to [facility] emergency department as this is the patient's 16th visit since September 25 secondary to back pain.  Patient admits to 10 out of 10 low back pain with radiation down the right leg.  States that he was prescribed Vicodin for this in the past.  Patient denies any bowel or bladder incontinence.  Patient denies any fever afebrile on presentation.    Past Medical History:  Diagnosis Date  . Anemia   . Anxiety   . Drug-seeking behavior    hx of narcotics dependence, previously discharged from pain management clinic  . Encounter for blood transfusion 2005, 02/2014  . GERD (gastroesophageal reflux disease)   . Hemorrhoid   . Herniated cervical disc     Patient Active Problem List   Diagnosis Date Noted  . Antisocial personality disorder ([place])   . Malingering   . Hemorrhoid 05/09/2014  . Skin abnormality 05/09/2014    Past Surgical History:  Procedure Laterality Date  . CERVICAL FUSION    . COLONOSCOPY  2/16   Dr. Vira Agar  . FRACTURE SURGERY Left    fractured femur  . prolapsed rectum surgery  04/30/1995   [facility], trans abdominal mesh repair per patient report  . SEPTOPLASTY      Prior to Admission medications   Medication Sig Start Date End Date Taking? Authorizing Provider  cyclobenzaprine (FLEXERIL) 10 MG tablet Take 1 tablet by mouth 2 (two) times daily as needed. 09/04/16   [provider]  docusate sodium (COLACE) 100 MG capsule Take 250 mg by mouth 2 (two) times daily.     [provider]  Ferrous Sulfate (IRON) 325 (65 FE) MG TABS Take 1 tablet by mouth daily.    [provider]   fluticasone (FLONASE) 50 MCG/ACT nasal spray Place 1 spray into both nostrils daily.  04/20/14   [provider]  gabapentin (NEURONTIN) 100 MG capsule Take 1 capsule (100 mg total) by mouth 3 (three) times daily. 11/13/18 11/13/19  Rudene Re, MD  glycerin adult 2 g suppository Place 1 suppository rectally as needed for constipation. 09/25/17   Nena Polio, MD  hydrocortisone (ANUSOL-HC) 25 MG suppository Place 1 suppository (25 mg total) rectally 2 (two) times daily. 05/22/14   Robert Bellow, MD  hydrocortisone (PROCTOSOL HC) 2.5 % rectal cream Place 1 application rectally 2 (two) times daily. 02/27/17   Earleen Newport, MD  hydrocortisone 2.5 % cream Apply 1 application topically daily. 11/30/16   [provider]  hydrocortisone-pramoxine (PROCTOFOAM HC) rectal foam Place 1 applicator rectally 2 (two) times daily. 12/22/16   Hinda Kehr, MD  hydrOXYzine (ATARAX/VISTARIL) 10 MG tablet Take 1 tablet (10 mg total) by mouth 3 (three) times daily as needed for itching. 04/12/16   Schaevitz, Randall An, MD  lidocaine (LIDODERM) 5 % Place 1 patch onto the skin every 12 (twelve) hours. Remove & Discard patch within 12 hours or as directed by MD 10/28/18 10/28/19  Gregor Hams, MD  methocarbamol (ROBAXIN) 750 MG tablet Take 1 tablet (750 mg total) by mouth every 8 (eight) hours as needed for muscle spasms. 11/13/18   Alfred Levins,  [place], MD  omeprazole (PRILOSEC) 20 MG capsule Take 20 mg by mouth daily.  04/16/14   [provider]  pantoprazole (PROTONIX) 40 MG tablet Take 40 mg by mouth daily. 11/30/16   [provider]  terbinafine (LAMISIL) 250 MG tablet Take 250 mg by mouth daily. 08/11/16   [provider]    Allergies Codeine and Amoxicillin  No family history on file.  Social History Social History   Tobacco Use  . Smoking status: Former Smoker    Years: 20.00    Quit date: 01/05/2005    Years since quitting: 13.8  .  Smokeless tobacco: Current User  Substance Use Topics  . Alcohol use: Not Currently    Alcohol/week: 0.0 standard drinks  . Drug use: No    Review of Systems Constitutional: No fever/chills Eyes: No visual changes. ENT: No sore throat. Cardiovascular: Denies chest pain. Respiratory: Denies shortness of breath. Gastrointestinal: No abdominal pain.  No nausea, no vomiting.  No diarrhea.  No constipation. Genitourinary: Negative for dysuria. Musculoskeletal: Negative for neck pain.  Positive for back pain. Integumentary: Negative for rash. Neurological: Negative for headaches, focal weakness or numbness.  ____________________________________________   PHYSICAL EXAM:  VITAL SIGNS: ED Triage Vitals  Enc Vitals Group     BP 11/19/18 0013 134/85     Pulse Rate 11/19/18 0013 86     Resp 11/19/18 0013 18     Temp 11/19/18 0013 98.4 F (36.9 C)     Temp Source 11/19/18 0013 Oral     SpO2 11/19/18 0013 99 %     Weight 11/19/18 0010 73 kg (161 lb)     Height 11/19/18 0010 1.753 m (5\' 9" )     Head Circumference --      Peak Flow --      Pain Score 11/19/18 0010 10     Pain Loc --      Pain Edu? --      Excl. in [place]? --     Constitutional: Alert and oriented.  No apparent distress patient ambulated to the room without any apparent difficulty Eyes: Conjunctivae are normal.  Head: Atraumatic. Mouth/Throat: Patient is wearing a mask. Neck: No stridor.  No meningeal signs.   Cardiovascular: Normal rate, regular rhythm. Good peripheral circulation. Grossly normal heart sounds. Respiratory: Normal respiratory effort.  No retractions. Gastrointestinal: Soft and nontender. No distention.  Musculoskeletal: No lower extremity tenderness nor edema. No gross deformities of extremities. Neurologic:  Normal speech and language. No gross focal neurologic deficits are appreciated.  Skin:  Skin is warm, dry and intact. Psychiatric: Mood and affect are normal. Speech and behavior are normal.     Procedures   ____________________________________________   INITIAL IMPRESSION / MDM / ASSESSMENT AND PLAN / ED COURSE  As part of my medical decision making, I reviewed the following data within the electronic MEDICAL RECORD NUMBER   58 year old male well-known to the emergency department presenting secondary to back pain with radiation down the right leg.  Patient with no clinical signs of cauda equina.  Patient with no clinical signs of an epidural abscess.  Mr. August SaucerDean has been referred to both pain management and neurosurgery multiple times by myself and other ED physicians here however patient has not followed up.  Patient is requesting Vicodin at this time which I informed him that I would not be prescribing.  Patient was offered Toradol gabapentin Lidoderm patches and muscle relaxant.  Patient refused gabapentin and Lidoderm patches.  The patient states  when you give me the Toradol will I feel it like when I get Vicodin".  Patient will be referred to chronic pain management again ___________________________  FINAL CLINICAL IMPRESSION(S) / ED DIAGNOSES  Final diagnoses:  Chronic right-sided low back pain with right-sided sciatica     MEDICATIONS GIVEN DURING THIS VISIT:  Medications  ketorolac (TORADOL) 30 MG/ML injection 30 mg (has no administration in time range)     ED Discharge Orders    None      *Please note:  GIULIO BERTINO was evaluated in Emergency Department on 11/19/2018 for the symptoms described in the history of present illness. He was evaluated in the context of the global COVID-19 pandemic, which necessitated consideration that the patient might be at risk for infection with the SARS-CoV-2 virus that causes COVID-19. Institutional protocols and algorithms that pertain to the evaluation of patients at risk for COVID-19 are in a state of rapid change based on information released by regulatory bodies including the CDC and federal and state organizations. These  policies and algorithms were followed during the patient's care in the ED.  Some ED evaluations and interventions may be delayed as a result of limited staffing during the pandemic.*  Note:  This document was prepared using Dragon voice recognition software and may include unintentional dictation errors.   Darci Current, MD 11/19/18 [phone]

## 2018-11-20 ENCOUNTER — Other Ambulatory Visit: Payer: Self-pay

## 2018-11-20 ENCOUNTER — Emergency Department
Admission: EM | Admit: 2018-11-20 | Discharge: 2018-11-20 | Disposition: A | Discharge status: Home / Self Care | Payer: Medicaid Other | Attending: Emergency Medicine | Admitting: Emergency Medicine

## 2018-11-20 DIAGNOSIS — M546 Pain in thoracic spine: Secondary | ICD-10-CM | POA: Insufficient documentation

## 2018-11-20 DIAGNOSIS — X500XXA Overexertion from strenuous movement or load, initial encounter: Secondary | ICD-10-CM | POA: Diagnosis not present

## 2018-11-20 DIAGNOSIS — Z79899 Other long term (current) drug therapy: Secondary | ICD-10-CM | POA: Diagnosis not present

## 2018-11-20 DIAGNOSIS — Z87891 Personal history of nicotine dependence: Secondary | ICD-10-CM | POA: Insufficient documentation

## 2018-11-20 DIAGNOSIS — G8929 Other chronic pain: Secondary | ICD-10-CM

## 2018-11-20 MED ORDER — LIDOCAINE 5 % EX PTCH: 1.0000 | MEDICATED_PATCH | Freq: Two times a day (BID) | CUTANEOUS | 0 refills | Status: AC

## 2018-11-20 MED ORDER — KETOROLAC TROMETHAMINE 10 MG PO TABS
10.0000 mg | ORAL_TABLET | Freq: Once | ORAL | Status: AC
  Administered 2018-11-20: 10 mg via ORAL
  Filled 2018-11-20: qty 1

## 2018-11-20 MED ORDER — LIDOCAINE 5 % EX PTCH
1.0000 | MEDICATED_PATCH | CUTANEOUS | Status: DC
  Administered 2018-11-20: 1 via TRANSDERMAL
  Filled 2018-11-20: qty 1

## 2018-11-20 NOTE — ED Triage Notes (Signed)
Pt to the er for back pain that he states he re injured tonight. Pt states he pulled his low back and has a hx of bulging disc.

## 2018-11-20 NOTE — Discharge Instructions (Addendum)
Apply Lidoderm patch to affected area as instructed.  Return to the ER for worsening symptoms, persistent vomiting, difficulty breathing, losing control of your bowel or bladder, extremity weakness or other concerns.

## 2018-11-20 NOTE — ED Provider Notes (Signed)
[facility] Emergency Department Provider Note   ____________________________________________   First MD Initiated Contact with Patient 11/20/18 [phone]     (approximate)  I have reviewed the triage vital signs and the nursing notes.   HISTORY  Chief Complaint Back Pain    HPI Shane [name] is a 58 y.o. male who presents to the ED from home with a chief complaint of back pain.  This is patient's 15th ED visit to this facility since September 25.  States he lifted over 20 pounds and reaggravated his back.  Reports acute on chronic pain to his mid thoracic back radiating towards the left and also his right lower back radiating into his right leg.  Complains of he was last seen for the same yesterday and received IM Toradol which he states helped somewhat.  Claims he has been unable to fill the prescription for gabapentin and carisoprodol written for him last week because he has to wait until the first of the month since he is on disability.  Also tells me he will not take the gabapentin because he is "afraid to take medicine".  Denies extremity weakness, bowel or bladder incontinence.  Denies trauma.       Past Medical History:  Diagnosis Date  . Anemia   . Anxiety   . Drug-seeking behavior    hx of narcotics dependence, previously discharged from pain management clinic  . Encounter for blood transfusion 2005, 02/2014  . GERD (gastroesophageal reflux disease)   . Hemorrhoid   . Herniated cervical disc     Patient Active Problem List   Diagnosis Date Noted  . Antisocial personality disorder (HCC)   . Malingering   . Hemorrhoid 05/09/2014  . Skin abnormality 05/09/2014    Past Surgical History:  Procedure Laterality Date  . CERVICAL FUSION    . COLONOSCOPY  2/16   Dr. Mechele Collin  . FRACTURE SURGERY Left    fractured femur  . prolapsed rectum surgery  04/30/1995   [facility], trans abdominal mesh repair per patient report  . SEPTOPLASTY       Prior to Admission medications   Medication Sig Start Date End Date Taking? Authorizing Provider  cyclobenzaprine (FLEXERIL) 10 MG tablet Take 1 tablet by mouth 2 (two) times daily as needed. 09/04/16   [provider]  docusate sodium (COLACE) 100 MG capsule Take 250 mg by mouth 2 (two) times daily.     [provider]  Ferrous Sulfate (IRON) 325 (65 FE) MG TABS Take 1 tablet by mouth daily.    [provider]  fluticasone (FLONASE) 50 MCG/ACT nasal spray Place 1 spray into both nostrils daily.  04/20/14   [provider]  gabapentin (NEURONTIN) 100 MG capsule Take 1 capsule (100 mg total) by mouth 3 (three) times daily. 11/13/18 11/13/19  Nita Sickle, MD  glycerin adult 2 g suppository Place 1 suppository rectally as needed for constipation. 09/25/17   Arnaldo Natal, MD  hydrocortisone (ANUSOL-HC) 25 MG suppository Place 1 suppository (25 mg total) rectally 2 (two) times daily. 05/22/14   Earline Mayotte, MD  hydrocortisone (PROCTOSOL HC) 2.5 % rectal cream Place 1 application rectally 2 (two) times daily. 02/27/17   Emily Filbert, MD  hydrocortisone 2.5 % cream Apply 1 application topically daily. 11/30/16   [provider]  hydrocortisone-pramoxine (PROCTOFOAM HC) rectal foam Place 1 applicator rectally 2 (two) times daily. 12/22/16   Loleta Rose, MD  hydrOXYzine (ATARAX/VISTARIL) 10 MG tablet Take  1 tablet (10 mg total) by mouth 3 (three) times daily as needed for itching. 04/12/16   Schaevitz, Randall An, MD  lidocaine (LIDODERM) 5 % Place 1 patch onto the skin every 12 (twelve) hours for 5 days. Remove & Discard patch within 12 hours or as directed by MD 11/20/18 11/25/18  Paulette Blanch, MD  methocarbamol (ROBAXIN) 750 MG tablet Take 1 tablet (750 mg total) by mouth every 8 (eight) hours as needed for muscle spasms. 11/13/18   Alfred Levins, [place], MD  omeprazole (PRILOSEC) 20 MG capsule Take 20 mg by mouth daily.  04/16/14   [provider]  pantoprazole (PROTONIX) 40 MG tablet Take 40 mg by mouth daily. 11/30/16   [provider]  terbinafine (LAMISIL) 250 MG tablet Take 250 mg by mouth daily. 08/11/16   [provider]    Allergies Codeine and Amoxicillin  No family history on file.  Social History Social History   Tobacco Use  . Smoking status: Former Smoker    Years: 20.00    Quit date: 01/05/2005    Years since quitting: 13.8  . Smokeless tobacco: Current User  Substance Use Topics  . Alcohol use: Not Currently    Alcohol/week: 0.0 standard drinks  . Drug use: No    Review of Systems  Constitutional: No fever/chills Eyes: No visual changes. ENT: No sore throat. Cardiovascular: Denies chest pain. Respiratory: Denies shortness of breath. Gastrointestinal: No abdominal pain.  No nausea, no vomiting.  No diarrhea.  No constipation. Genitourinary: Negative for dysuria. Musculoskeletal: Positive for back pain. Skin: Negative for rash. Neurological: Negative for headaches, focal weakness or numbness.   ____________________________________________   PHYSICAL EXAM:  VITAL SIGNS: ED Triage Vitals  Enc Vitals Group     BP 11/20/18 0105 119/78     Pulse Rate 11/20/18 0105 87     Resp 11/20/18 0105 18     Temp 11/20/18 0105 98.1 F (36.7 C)     Temp Source 11/20/18 0105 Oral     SpO2 11/20/18 0105 98 %     Weight 11/20/18 0033 150 lb (68 kg)     Height 11/20/18 0033 5\' 9"  (1.753 m)     Head Circumference --      Peak Flow --      Pain Score 11/20/18 0033 9     Pain Loc --      Pain Edu? --      Excl. in [place]? --     Constitutional: Alert and oriented. Well appearing and in no acute distress. Eyes: Conjunctivae are normal. PERRL. EOMI. Head: Atraumatic. Nose: No congestion/rhinnorhea. Mouth/Throat: Mucous membranes are moist.  Oropharynx non-erythematous. Neck: No stridor.  No cervical spine tenderness to palpation. Cardiovascular: Normal rate, regular rhythm.  Grossly normal heart sounds.  Good peripheral circulation. Respiratory: Normal respiratory effort.  No retractions. Lungs CTAB. Gastrointestinal: Soft and nontender. No distention. No abdominal bruits. No CVA tenderness. Musculoskeletal: No spinal tenderness to palpation.  No lower extremity tenderness nor edema.  Negative straight leg raise.  No joint effusions. Neurologic:  Normal speech and language. No gross focal neurologic deficits are appreciated. No gait instability. Skin:  Skin is warm, dry and intact. No rash noted. Psychiatric: Mood and affect are normal. Speech and behavior are normal.  ____________________________________________   LABS (all labs ordered are listed, but only abnormal results are displayed)  Labs Reviewed - No data to display ____________________________________________  EKG  None ____________________________________________  RADIOLOGY  ED MD interpretation: None  Official radiology report(s): No results found.  ____________________________________________   PROCEDURES  Procedure(s) performed (including Critical Care):  Procedures   ____________________________________________   INITIAL IMPRESSION / ASSESSMENT AND PLAN / ED COURSE  As part of my medical decision making, I reviewed the following data within the electronic MEDICAL RECORD NUMBER Nursing notes reviewed and incorporated, Old chart reviewed, Notes from prior ED visits and [place] Controlled Substance Database     Shane [name] was evaluated in Emergency Department on 11/20/2018 for the symptoms described in the history of present illness. He was evaluated in the context of the global COVID-19 pandemic, which necessitated consideration that the patient might be at risk for infection with the SARS-CoV-2 virus that causes COVID-19. Institutional protocols and algorithms that pertain to the evaluation of patients at risk for COVID-19 are in a state of rapid change based on information released  by regulatory bodies including the CDC and federal and state organizations. These policies and algorithms were followed during the patient's care in the ED.    58 year old male who presents with acute on chronic back pain.  This is patient's 15th ED visit to our facility since September 25.  Last night he refused gabapentin and Lidoderm patch.  Tonight he refuses Toradol IM but will take it orally and is asking for a Lidoderm patch.  I again reviewed with patient our chronic pain policy and have referred him to his PCP for pain clinic referral.  Of note, patient hopped out of his wheelchair and walked without difficulty to the door demanding to know "how much longer because I've got to go".  Strict return precautions given.  Patient verbalizes understanding and agrees with plan of care.      ____________________________________________   FINAL CLINICAL IMPRESSION(S) / ED DIAGNOSES  Final diagnoses:  Chronic left-sided thoracic back pain     ED Discharge Orders         Ordered    lidocaine (LIDODERM) 5 %  Every 12 hours     11/20/18 0501           Note:  This document was prepared using Dragon voice recognition software and may include unintentional dictation errors.   Irean HongSung, Ariadna Setter J, MD 11/20/18 [phone]

## 2018-11-21 ENCOUNTER — Emergency Department: Payer: Medicaid Other

## 2018-11-21 ENCOUNTER — Encounter: Payer: Self-pay | Admitting: Emergency Medicine

## 2018-11-21 ENCOUNTER — Emergency Department
Admission: EM | Admit: 2018-11-21 | Discharge: 2018-11-21 | Disposition: A | Discharge status: Home / Self Care | Payer: Medicaid Other | Source: Home / Self Care | Attending: Student in an Organized Health Care Education/Training Program | Admitting: Student in an Organized Health Care Education/Training Program

## 2018-11-21 ENCOUNTER — Other Ambulatory Visit: Payer: Self-pay

## 2018-11-21 ENCOUNTER — Emergency Department
Admission: EM | Admit: 2018-11-21 | Discharge: 2018-11-21 | Disposition: A | Discharge status: Home / Self Care | Payer: Medicaid Other | Attending: Emergency Medicine | Admitting: Emergency Medicine

## 2018-11-21 DIAGNOSIS — S93401A Sprain of unspecified ligament of right ankle, initial encounter: Secondary | ICD-10-CM

## 2018-11-21 DIAGNOSIS — G8929 Other chronic pain: Secondary | ICD-10-CM | POA: Diagnosis not present

## 2018-11-21 DIAGNOSIS — F17220 Nicotine dependence, chewing tobacco, uncomplicated: Secondary | ICD-10-CM | POA: Diagnosis not present

## 2018-11-21 DIAGNOSIS — Z79899 Other long term (current) drug therapy: Secondary | ICD-10-CM | POA: Diagnosis not present

## 2018-11-21 DIAGNOSIS — S93491A Sprain of other ligament of right ankle, initial encounter: Secondary | ICD-10-CM | POA: Insufficient documentation

## 2018-11-21 DIAGNOSIS — M545 Low back pain: Secondary | ICD-10-CM | POA: Diagnosis not present

## 2018-11-21 DIAGNOSIS — X501XXA Overexertion from prolonged static or awkward postures, initial encounter: Secondary | ICD-10-CM | POA: Insufficient documentation

## 2018-11-21 DIAGNOSIS — Y9389 Activity, other specified: Secondary | ICD-10-CM | POA: Insufficient documentation

## 2018-11-21 DIAGNOSIS — Y999 Unspecified external cause status: Secondary | ICD-10-CM | POA: Insufficient documentation

## 2018-11-21 DIAGNOSIS — Z87891 Personal history of nicotine dependence: Secondary | ICD-10-CM | POA: Insufficient documentation

## 2018-11-21 DIAGNOSIS — Y929 Unspecified place or not applicable: Secondary | ICD-10-CM | POA: Insufficient documentation

## 2018-11-21 MED ORDER — MELOXICAM 7.5 MG PO TABS
15.0000 mg | ORAL_TABLET | Freq: Once | ORAL | Status: AC
  Administered 2018-11-21: 15 mg via ORAL
  Filled 2018-11-21: qty 2

## 2018-11-21 MED ORDER — KETOROLAC TROMETHAMINE 10 MG PO TABS
10.0000 mg | ORAL_TABLET | Freq: Once | ORAL | Status: AC
  Administered 2018-11-21: 10 mg via ORAL
  Filled 2018-11-21: qty 1

## 2018-11-21 MED ORDER — MELOXICAM 15 MG PO TABS: 15.0000 mg | ORAL_TABLET | Freq: Every day | ORAL | 0 refills | Status: AC

## 2018-11-21 MED ORDER — KETOROLAC TROMETHAMINE 10 MG PO TABS: 10.0000 mg | ORAL_TABLET | Freq: Four times a day (QID) | ORAL | 0 refills | Status: DC | PRN

## 2018-11-21 NOTE — ED Triage Notes (Addendum)
Pt reports back pain for 3 years, ever since he had surgery on his cervical spine; pt says the pain is just worse; picked up some boxes the other night and the pain increased in his thoracic spine; pt in no acute distress; after a long discussion with pt regarding his frequent visits for chronic back pain, pt verbalizes understanding of what the ER is allowed and not allowed to prescribe for pain; pt says he's trying to get in with Dr Wynn Banker at [place] [facility] for an MRI; pt says he has been given Toradol IM before in our ED and that has actually helped him; pt very chatty in triage;

## 2018-11-21 NOTE — ED Provider Notes (Addendum)
[facility] Emergency Department Provider Note  ____________________________________________  Time seen: Approximately 2:31 AM  I have reviewed the triage vital signs and the nursing notes.   HISTORY  Chief Complaint Back Pain   HPI Shane [name] is a 58 y.o. male with a history of chronic back pain and drug-seeking behavior who presents for evaluation of back pain.  This is patient's 19th visit to the emergency department the last 2 months for the same complaint.  He has been trying to get in with chronic pain management but has not been able to get an appointment yet.  He is complaining of the same pain that he has had for  over 3 years.  He reports the pain is worse after he picked up some heavy boxes a few nights ago.  The pain is in his lower back, sharp, constant and nonradiating.  No fever, chills, unintentional weight loss, IV drug use, saddle anesthesia, weakness or numbness of his extremities, urinary or bowel incontinence or retention.  He reports that he only has Tylenol at home.  Has been unable to fill his prescription for gabapentin given to him last time.  Past Medical History:  Diagnosis Date   Anemia    Anxiety    Drug-seeking behavior    hx of narcotics dependence, previously discharged from pain management clinic   Encounter for blood transfusion 2005, 02/2014   GERD (gastroesophageal reflux disease)    Hemorrhoid    Herniated cervical disc     Patient Active Problem List   Diagnosis Date Noted   Antisocial personality disorder (HCC)    Malingering    Hemorrhoid 05/09/2014   Skin abnormality 05/09/2014    Past Surgical History:  Procedure Laterality Date   CERVICAL FUSION     COLONOSCOPY  2/16   Dr. Mechele Collin   FRACTURE SURGERY Left    fractured femur   prolapsed rectum surgery  04/30/1995   [facility], trans abdominal mesh repair per patient report   SEPTOPLASTY      Prior to Admission medications     Medication Sig Start Date End Date Taking? Authorizing Provider  cyclobenzaprine (FLEXERIL) 10 MG tablet Take 1 tablet by mouth 2 (two) times daily as needed. 09/04/16   [provider]  docusate sodium (COLACE) 100 MG capsule Take 250 mg by mouth 2 (two) times daily.     [provider]  Ferrous Sulfate (IRON) 325 (65 FE) MG TABS Take 1 tablet by mouth daily.    [provider]  fluticasone (FLONASE) 50 MCG/ACT nasal spray Place 1 spray into both nostrils daily.  04/20/14   [provider]  gabapentin (NEURONTIN) 100 MG capsule Take 1 capsule (100 mg total) by mouth 3 (three) times daily. 11/13/18 11/13/19  Nita Sickle, MD  glycerin adult 2 g suppository Place 1 suppository rectally as needed for constipation. 09/25/17   Arnaldo Natal, MD  hydrocortisone (ANUSOL-HC) 25 MG suppository Place 1 suppository (25 mg total) rectally 2 (two) times daily. 05/22/14   Earline Mayotte, MD  hydrocortisone (PROCTOSOL HC) 2.5 % rectal cream Place 1 application rectally 2 (two) times daily. 02/27/17   Emily Filbert, MD  hydrocortisone 2.5 % cream Apply 1 application topically daily. 11/30/16   [provider]  hydrocortisone-pramoxine (PROCTOFOAM HC) rectal foam Place 1 applicator rectally 2 (two) times daily. 12/22/16   Loleta Rose, MD  hydrOXYzine (ATARAX/VISTARIL) 10 MG tablet Take 1 tablet (10 mg total) by mouth 3 (three)  times daily as needed for itching. 04/12/16   Schaevitz, Randall An, MD  ketorolac (TORADOL) 10 MG tablet Take 1 tablet (10 mg total) by mouth every 6 (six) hours as needed. 11/21/18   Alfred Levins, [place], MD  lidocaine (LIDODERM) 5 % Place 1 patch onto the skin every 12 (twelve) hours for 5 days. Remove & Discard patch within 12 hours or as directed by MD 11/20/18 11/25/18  Paulette Blanch, MD  methocarbamol (ROBAXIN) 750 MG tablet Take 1 tablet (750 mg total) by mouth every 8 (eight) hours as needed for muscle spasms. 11/13/18    Alfred Levins, [place], MD  omeprazole (PRILOSEC) 20 MG capsule Take 20 mg by mouth daily.  04/16/14   [provider]  pantoprazole (PROTONIX) 40 MG tablet Take 40 mg by mouth daily. 11/30/16   [provider]  terbinafine (LAMISIL) 250 MG tablet Take 250 mg by mouth daily. 08/11/16   [provider]    Allergies Codeine and Amoxicillin  History reviewed. No pertinent family history.  Social History Social History   Tobacco Use   Smoking status: Former Smoker    Years: 20.00    Quit date: 01/05/2005    Years since quitting: 13.8   Smokeless tobacco: Current User  Substance Use Topics   Alcohol use: Not Currently    Alcohol/week: 0.0 standard drinks   Drug use: No    Review of Systems  Constitutional: Negative for fever. Eyes: Negative for visual changes. ENT: Negative for sore throat. Neck: No neck pain  Cardiovascular: Negative for chest pain. Respiratory: Negative for shortness of breath. Gastrointestinal: Negative for abdominal pain, vomiting or diarrhea. Genitourinary: Negative for dysuria. Musculoskeletal: + back pain. Skin: Negative for rash. Neurological: Negative for headaches, weakness or numbness. Psych: No SI or HI  ____________________________________________   PHYSICAL EXAM:  VITAL SIGNS: ED Triage Vitals  Enc Vitals Group     BP 11/21/18 0033 139/81     Pulse Rate 11/21/18 0033 87     Resp 11/21/18 0033 17     Temp 11/21/18 0033 98.7 F (37.1 C)     Temp Source 11/21/18 0033 Oral     SpO2 11/21/18 0033 100 %     Weight 11/21/18 0034 161 lb (73 kg)     Height 11/21/18 0034 5\' 9"  (1.753 m)     Head Circumference --      Peak Flow --      Pain Score --      Pain Loc --      Pain Edu? --      Excl. in [place]? --     Constitutional: Alert and oriented. Well appearing and in no apparent distress. HEENT:      Head: Normocephalic and atraumatic.         Eyes: Conjunctivae are normal. Sclera is non-icteric.        Mouth/Throat: Mucous membranes are moist.       Neck: Supple with no signs of meningismus. Cardiovascular: Regular rate and rhythm.  Respiratory: Normal respiratory effort.  Gastrointestinal: Soft, non tender, and non distended with positive bowel sounds. No rebound or guarding. Musculoskeletal: Nontender with normal range of motion in all extremities. No edema, cyanosis, or erythema of extremities.  No midline CT and L-spine tenderness. Neurologic: Normal speech and language. Face is symmetric. Moving all extremities.  Intact strength and sensation bilateral lower extremities.  2+ patellar DTR bilaterally Skin: Skin is warm, dry and intact. No rash noted. Psychiatric: Mood and affect are normal.  Speech and behavior are normal.  ____________________________________________   LABS (all labs ordered are listed, but only abnormal results are displayed)  Labs Reviewed - No data to display ____________________________________________  EKG  none  ____________________________________________  RADIOLOGY  none  ____________________________________________   PROCEDURES  Procedure(s) performed: None Procedures Critical Care performed:  None ____________________________________________   INITIAL IMPRESSION / ASSESSMENT AND PLAN / ED COURSE  58 y.o. male with a history of chronic back pain and drug-seeking behavior who presents for evaluation of back pain.  Patient with a history of chronic back pain for over 3 years.  This is his 19th visit to the emergency room in the last 2 months for the same.  Patient reports that he has been trying to get in with chronic pain management but has been unable to do so.  He only has Tylenol at home for his pain.  He reports that Toradol has helped in the past.  We will give him a dose of oral Toradol and send him home with a prescription.  Again reinforced with the patient the necessity of getting in with his PCP or chronic pain management for further  management of his symptoms.  I discussed my standard return precautions for any signs of cauda equina, discitis, or new trauma which are not present during this examination today.  Patient is ambulating with no difficulty, neurologically intact with normal reflexes.       As part of my medical decision making, I reviewed the following data within the electronic MEDICAL RECORD NUMBER Nursing notes reviewed and incorporated, Old chart reviewed, Notes from prior ED visits and [place] Controlled Substance Database   Patient was evaluated in Emergency Department today for the symptoms described in the history of present illness. Patient was evaluated in the context of the global COVID-19 pandemic, which necessitated consideration that the patient might be at risk for infection with the SARS-CoV-2 virus that causes COVID-19. Institutional protocols and algorithms that pertain to the evaluation of patients at risk for COVID-19 are in a state of rapid change based on information released by regulatory bodies including the CDC and federal and state organizations. These policies and algorithms were followed during the patient's care in the ED.   ____________________________________________   FINAL CLINICAL IMPRESSION(S) / ED DIAGNOSES   Final diagnoses:  Chronic low back pain, unspecified back pain laterality, unspecified whether sciatica present      NEW MEDICATIONS STARTED DURING THIS VISIT:  ED Discharge Orders         Ordered    ketorolac (TORADOL) 10 MG tablet  Every 6 hours PRN     11/21/18 0230           Note:  This document was prepared using Dragon voice recognition software and may include unintentional dictation errors.    Nita SickleVeronese, [place], MD 11/21/18 [id number]    Nita SickleVeronese, [place], MD 11/21/18 [phone]

## 2018-11-21 NOTE — ED Provider Notes (Signed)
[facility] Emergency Department Provider Note  ____________________________________________  Time seen: Approximately 9:18 PM  I have reviewed the triage vital signs and the nursing notes.   HISTORY  Chief Complaint Ankle Pain    HPI Shane [name] is a 58 y.o. male who presents the emergency department complaining of right ankle pain.  Patient is well-known to this department having been here 20 times in the last 2 months for chronic pain.  Patient has a history of drug-seeking behavior, has been previously discharged from pain management.  Patient has been seen multiple times in this department and has significant drug-seeking behavior while here.  Patient has been advised to follow-up with primary care or pain management for his chronic pain complaints.  Patient is here tonight complaining of ankle pain after he states that he had "sat for too long" and when he got up his leg gave out causing him to twist his ankle.  Patient did not fall and hit his head.         Past Medical History:  Diagnosis Date  . Anemia   . Anxiety   . Drug-seeking behavior    hx of narcotics dependence, previously discharged from pain management clinic  . Encounter for blood transfusion 2005, 02/2014  . GERD (gastroesophageal reflux disease)   . Hemorrhoid   . Herniated cervical disc     Patient Active Problem List   Diagnosis Date Noted  . Antisocial personality disorder (HCC)   . Malingering   . Hemorrhoid 05/09/2014  . Skin abnormality 05/09/2014    Past Surgical History:  Procedure Laterality Date  . CERVICAL FUSION    . COLONOSCOPY  2/16   Dr. Mechele CollinElliott  . FRACTURE SURGERY Left    fractured femur  . prolapsed rectum [facility]  [facility] per patient report  . SEPTOPLASTY      Prior to Admission medications   Medication Sig Start Date End Date Taking? Authorizing Provider  cyclobenzaprine (FLEXERIL) 10 MG tablet  Take 1 tablet by mouth 2 (two) times daily as needed. 09/04/16   [provider]  docusate sodium (COLACE) 100 MG capsule Take 250 mg by mouth 2 (two) times daily.     [provider]  Ferrous Sulfate (IRON) 325 (65 FE) MG TABS Take 1 tablet by mouth daily.    [provider]  fluticasone (FLONASE) 50 MCG/ACT nasal spray Place 1 spray into both nostrils daily.  04/20/14   [provider]  gabapentin (NEURONTIN) 100 MG capsule Take 1 capsule (100 mg total) by mouth 3 (three) times daily. 11/13/18 11/13/19  Nita SickleVeronese, [place], MD  glycerin adult 2 g suppository Place 1 suppository rectally as needed for constipation. 09/25/17   Arnaldo NatalMalinda, Paul F, MD  hydrocortisone (ANUSOL-HC) 25 MG suppository Place 1 suppository (25 mg total) rectally 2 (two) times daily. 05/22/14   Earline MayotteByrnett, Jeffrey W, MD  hydrocortisone (PROCTOSOL HC) 2.5 % rectal cream Place 1 application rectally 2 (two) times daily. 02/27/17   Emily FilbertWilliams,  E, MD  hydrocortisone 2.5 % cream Apply 1 application topically daily. 11/30/16   [provider]  hydrocortisone-pramoxine (PROCTOFOAM HC) rectal foam Place 1 applicator rectally 2 (two) times daily. 12/22/16   Loleta RoseForbach, Cory, MD  hydrOXYzine (ATARAX/VISTARIL) 10 MG tablet Take 1 tablet (10 mg total) by mouth 3 (three) times daily as needed for itching. 04/12/16   Schaevitz, Myra Rudeavid Matthew, MD  ketorolac (TORADOL) 10 MG tablet Take 1 tablet (10 mg  total) by mouth every 6 (six) hours as needed. 11/21/18   Don Perking, [place], MD  lidocaine (LIDODERM) 5 % Place 1 patch onto the skin every 12 (twelve) hours for 5 days. Remove & Discard patch within 12 hours or as directed by MD 11/20/18 11/25/18  Irean Hong, MD  meloxicam (MOBIC) 15 MG tablet Take 1 tablet (15 mg total) by mouth daily. 11/21/18   , Delorise Royals, PA-C  methocarbamol (ROBAXIN) 750 MG tablet Take 1 tablet (750 mg total) by mouth every 8 (eight) hours as needed for muscle spasms. 11/13/18    Don Perking, [place], MD  omeprazole (PRILOSEC) 20 MG capsule Take 20 mg by mouth daily.  04/16/14   [provider]  pantoprazole (PROTONIX) 40 MG tablet Take 40 mg by mouth daily. 11/30/16   [provider]  terbinafine (LAMISIL) 250 MG tablet Take 250 mg by mouth daily. 08/11/16   [provider]    Allergies Codeine and Amoxicillin  No family history on file.  Social History Social History   Tobacco Use  . Smoking status: Former Smoker    Years: 20.00    Quit date: 01/05/2005    Years since quitting: 13.8  . Smokeless tobacco: Current User  Substance Use Topics  . Alcohol use: Not Currently    Alcohol/week: 0.0 standard drinks  . Drug use: No     Review of Systems  Constitutional: No fever/chills Eyes: No visual changes. No discharge ENT: No upper respiratory complaints. Cardiovascular: no chest pain. Respiratory: no cough. No SOB. Gastrointestinal: No abdominal pain.  No nausea, no vomiting.  Musculoskeletal: R ankle pain Skin: Negative for rash, abrasions, lacerations, ecchymosis. Neurological: Negative for headaches, focal weakness or numbness. 10-point ROS otherwise negative.  ____________________________________________   PHYSICAL EXAM:  VITAL SIGNS: ED Triage Vitals  Enc Vitals Group     BP 11/21/18 2059 138/89     Pulse Rate 11/21/18 2059 95     Resp 11/21/18 2059 16     Temp 11/21/18 2059 98.3 F (36.8 C)     Temp Source 11/21/18 2059 Oral     SpO2 11/21/18 2059 100 %     Weight 11/21/18 2055 160 lb (72.6 kg)     Height 11/21/18 2055 5' 9.5" (1.765 m)     Head Circumference --      Peak Flow --      Pain Score 11/21/18 2056 9     Pain Loc --      Pain Edu? --      Excl. in GC? --      Constitutional: Alert and oriented. Well appearing and in no acute distress. Eyes: Conjunctivae are normal. PERRL. EOMI. Head: Atraumatic. Neck: No stridor.    Cardiovascular: Normal rate, regular rhythm. Normal S1 and S2.  Good  peripheral circulation. Respiratory: Normal respiratory effort without tachypnea or retractions. Lungs CTAB. Good air entry to the bases with no decreased or absent breath sounds. Musculoskeletal: Full range of motion to all extremities. No gross deformities appreciated.  Visualization of the right ankle reveals no visible signs of trauma.  No edema, ecchymosis, abrasions or lacerations.  Full range of motion.  Patient reports global tenderness to palpation.  No palpable abnormality.  Dorsalis pedis pulse intact.  Sensation intact all digits. Neurologic:  Normal speech and language. No gross focal neurologic deficits are appreciated.  Skin:  Skin is warm, dry and intact. No rash noted. Psychiatric: Mood and affect are normal. Speech and behavior are normal. Patient exhibits  appropriate insight and judgement.   ____________________________________________   LABS (all labs ordered are listed, but only abnormal results are displayed)  Labs Reviewed - No data to display ____________________________________________  EKG   ____________________________________________  RADIOLOGY I personally viewed and evaluated these images as part of my medical decision making, as well as reviewing the written report by the radiologist.  Dg Ankle Complete Right  Result Date: 11/21/2018 CLINICAL DATA:  Fall, pain, swelling EXAM: RIGHT ANKLE - COMPLETE 3+ VIEW COMPARISON:  11/12/2018 FINDINGS: There is no evidence of fracture, dislocation, or joint effusion. There is no evidence of arthropathy or other focal bone abnormality. Soft tissues are unremarkable. IMPRESSION: Negative. Electronically Signed   By: Rolm Baptise M.D.   On: 11/21/2018 21:21    ____________________________________________    PROCEDURES  Procedure(s) performed:    Procedures    Medications  meloxicam (MOBIC) tablet 15 mg (has no administration in time range)     ____________________________________________   INITIAL  IMPRESSION / ASSESSMENT AND PLAN / ED COURSE  Pertinent labs & imaging results that were available during my care of the patient were reviewed by me and considered in my medical decision making (see chart for details).  Review of the [place] CSRS was performed in accordance of the [place] prior to dispensing any controlled drugs.           [name] diagnosis is consistent with ankle sprain.  Patient presented to emergency department complaining of right ankle pain after he rolled it while standing up.  Exam is reassuring.  X-ray reveals no acute traumatic injury.  Patient is given Ace bandage here in the emergency department and instructed to follow-up with primary care, pain management or neurosurgery for his chronic back issues.  Patient requested narcotics both here in the emergency department and for discharge and I informed the patient that I would not prescribe or provide narcotics at this time.  Patient has a history of drug-seeking behavior and exhibited same tonight.  Patient is given a prescription for meloxicam.  Again, follow-up with primary care, pain management or neurosurgery for chronic back problems.. Patient is given ED precautions to return to the ED for any worsening or new symptoms.     ____________________________________________  FINAL CLINICAL IMPRESSION(S) / ED DIAGNOSES  Final diagnoses:  Sprain of right ankle, unspecified ligament, initial encounter      NEW MEDICATIONS STARTED DURING THIS VISIT:  ED Discharge Orders         Ordered    meloxicam (MOBIC) 15 MG tablet  Daily     11/21/18 2131              This chart was dictated using voice recognition software/Dragon. Despite best efforts to proofread, errors can occur which can change the meaning. Any change was purely unintentional.    Darletta Moll, PA-C 11/21/18 2133    Merlyn Lot, MD 11/21/18 [phone]

## 2018-11-21 NOTE — Discharge Instructions (Signed)
Obtain an ASO lace up stirrup ankle brace for mobilization of your ankle.  You may find them at [place], pharmacies or any sports store that sells braces.

## 2018-11-21 NOTE — ED Triage Notes (Signed)
Patient states he was attempting to stand after sitting for some time and he states his right leg, "gave out" and he twisted his right ankle.  Patient is ambulatory with a limp at this time.  No obvious distress.  Patient denies falling.

## 2018-11-24 ENCOUNTER — Emergency Department
Admission: EM | Admit: 2018-11-24 | Discharge: 2018-11-24 | Disposition: A | Discharge status: Home / Self Care | Payer: Medicaid Other | Attending: Emergency Medicine | Admitting: Emergency Medicine

## 2018-11-24 ENCOUNTER — Other Ambulatory Visit: Payer: Self-pay

## 2018-11-24 ENCOUNTER — Encounter: Payer: Self-pay | Admitting: Emergency Medicine

## 2018-11-24 DIAGNOSIS — R42 Dizziness and giddiness: Secondary | ICD-10-CM | POA: Insufficient documentation

## 2018-11-24 DIAGNOSIS — Z79899 Other long term (current) drug therapy: Secondary | ICD-10-CM | POA: Insufficient documentation

## 2018-11-24 DIAGNOSIS — M549 Dorsalgia, unspecified: Secondary | ICD-10-CM

## 2018-11-24 DIAGNOSIS — K625 Hemorrhage of anus and rectum: Secondary | ICD-10-CM | POA: Insufficient documentation

## 2018-11-24 DIAGNOSIS — Z87891 Personal history of nicotine dependence: Secondary | ICD-10-CM | POA: Insufficient documentation

## 2018-11-24 DIAGNOSIS — M545 Low back pain: Secondary | ICD-10-CM | POA: Diagnosis present

## 2018-11-24 DIAGNOSIS — G8929 Other chronic pain: Secondary | ICD-10-CM | POA: Insufficient documentation

## 2018-11-24 LAB — CBC WITH DIFFERENTIAL/PLATELET
Abs Immature Granulocytes: 0.02 10*3/uL (ref 0.00–0.07)
Basophils Absolute: 0 10*3/uL (ref 0.0–0.1)
Basophils Relative: 1 %
Eosinophils Absolute: 0.1 10*3/uL (ref 0.0–0.5)
Eosinophils Relative: 2 %
HCT: 41.4 % (ref 39.0–52.0)
Hemoglobin: 13.6 g/dL (ref 13.0–17.0)
Immature Granulocytes: 0 %
Lymphocytes Relative: 23 %
Lymphs Abs: 1.5 10*3/uL (ref 0.7–4.0)
MCH: 32.6 pg (ref 26.0–34.0)
MCHC: 32.9 g/dL (ref 30.0–36.0)
MCV: 99.3 fL (ref 80.0–100.0)
Monocytes Absolute: 0.7 10*3/uL (ref 0.1–1.0)
Monocytes Relative: 10 %
Neutro Abs: 4.4 10*3/uL (ref 1.7–7.7)
Neutrophils Relative %: 64 %
Platelets: 246 10*3/uL (ref 150–400)
RBC: 4.17 MIL/uL — ABNORMAL LOW (ref 4.22–5.81)
RDW: 13.2 % (ref 11.5–15.5)
WBC: 6.8 10*3/uL (ref 4.0–10.5)
nRBC: 0 % (ref 0.0–0.2)

## 2018-11-24 LAB — TYPE AND SCREEN
ABO/RH(D): AB POS
Antibody Screen: NEGATIVE

## 2018-11-24 LAB — COMPREHENSIVE METABOLIC PANEL
ALT: 22 U/L (ref 0–44)
AST: 20 U/L (ref 15–41)
Albumin: 3.5 g/dL (ref 3.5–5.0)
Alkaline Phosphatase: 77 U/L (ref 38–126)
Anion gap: 8 (ref 5–15)
BUN: 12 mg/dL (ref 6–20)
CO2: 29 mmol/L (ref 22–32)
Calcium: 9 mg/dL (ref 8.9–10.3)
Chloride: 104 mmol/L (ref 98–111)
Creatinine, Ser: 0.7 mg/dL (ref 0.61–1.24)
GFR calc Af Amer: >60 mL/min (ref >60)
GFR calc non Af Amer: >60 mL/min (ref >60)
Glucose, Bld: 105 mg/dL — ABNORMAL HIGH (ref 70–99)
Potassium: 4.1 mmol/L (ref 3.5–5.1)
Sodium: 141 mmol/L (ref 135–145)
Total Bilirubin: 0.6 mg/dL (ref 0.3–1.2)
Total Protein: 6.2 g/dL — ABNORMAL LOW (ref 6.5–8.1)

## 2018-11-24 LAB — LIPASE, BLOOD: Lipase: 51 U/L (ref 11–51)

## 2018-11-24 LAB — TROPONIN I (HIGH SENSITIVITY): Troponin I (High Sensitivity): 5 ng/L (ref <18)

## 2018-11-24 NOTE — Discharge Instructions (Signed)
As we discussed, fortunately you have a reassuring work-up tonight.  Your hemoglobin is normal as are the rest of your labs.  As you reported, you were able to eat and drink without difficulty and you have normal vital signs.  There is no indication that you need fluids or any other emergent intervention tonight.  You declined a rectal exam tonight so I could not verify the presence or absence of blood in your stool, but given that you have seen blood in your stool at home, I recommend that you call the office of Dr. Alice Reichert to follow-up with a GI specialist to see if you may benefit from a colonoscopy.  Please avoid medications such as ibuprofen, naproxen, and aspirin, as this can worsen bleeding.  Regarding your chronic back pain, as has been explained to you in the past, the emergency department cannot treat chronic pain.  I have given you follow-up information regarding the [facility] pain clinic, specifically with Dr. Holley Raring.  I recommend that you call the number provided and schedule an appointment at the pain clinic for help with your ongoing back pain.  Please refer to the [facility] Chronic Pain policy included below for more details.  Return to the emergency department if you develop new or worsening symptoms that concern you.    [place] Chronic Pain Policy Emergency care providers appreciate that many patients coming to [place] are in severe pain and we wish to address their pain in the safest, most responsible manner.  It is important to recognize, however, that the proper treatment of chronic pain differs from that of the pain of injuries and acute illnesses.  Our goal is to provider quality, safe, personalized care and we thank you for giving [place] the opportunity to serve you.  The use of narcotics and related agents for chronic pain syndromes may lead to additional physical and psychological problems.  Nearly as many people die from prescription narcotics each year as die from car crashes.   Additionally, this risk is increased if such prescriptions are obtained from a variety of sources.  Therefore, only your primary care physician or a pain management specialist is able to safely treat such syndromes with narcotic medications long-term.  Documentation revealing such prescriptions have been sought from multiple sources may prohibit [place] from providing a refill or different narcotic medication.  Your name may be checked first through the [place].  This database is a record of controlled substance medication prescriptions that the patient has received.  This has been established by [facility] in an effort to eliminate the dangerous, and often life-threatening, practice of obtaining multiple prescriptions from different medical providers.  If you have a chronic pain syndrome (i.e. chronic headaches, recurrent back or neck pain, dental pain, abdominal or pelvic pain without a specific diagnosis, or neuropathic pain such as fibromyalgia) or recurrent visits for the same condition without an acute diagnosis, you may be treated with non-narcotics and other non-addictive medicines.  Allergic reactions or negative side effects that may be reported by a patient to such medications will not typically lead to the use of a narcotic analgesic or other controlled substance as an alternative.  Patients managing chronic pain with a personal physician should have provisions in place for breakthrough pain.  If you are in crisis, you should call your physician.  If your physician directs you to the emergency department, please have the doctor call and speak to our attending physician concerning your care.  When patients come to the Emergency Department (ED) with acute medical conditions in which the ED physician feels it is appropriate to prescribe narcotic or sedating pain medication, the physician will prescribe these in very limited quantities.  The amount of  these medications will last only until you can see your primary care physician in his/her office.  Any patient who returns to the ED seeking refills should expect only non-narcotic pain medications.  In the event an acute medical condition exists and the emergency physician feels it is necessary that the patient be given a narcotic or sedating medication, a responsible adult driver should be present in the room prior to the medication being given by the nurse.  Prescriptions for narcotic or sedating medications that have been lost, stolen, or expired will NOT be refilled in the ED.  Patients who have chronic pain may receive non-narcotic prescriptions until seen by their primary care physician.  It is every patient's personal responsibility to maintain active prescriptions with his or her primary care physician or specialist.

## 2018-11-24 NOTE — ED Triage Notes (Signed)
Patient ambulatory to triage with steady gait, without difficulty or distress noted, mask in place; pt reports dizziness with rectal bleeding; st hx blood tx in past with dx internal hemorrhoids; pt st he also wants to be seen for his chronic back pain (pt has been seen multiple times for such)

## 2018-11-24 NOTE — ED Provider Notes (Signed)
[facility] Emergency Department Provider Note  ____________________________________________   First MD Initiated Contact with Patient 11/24/18 [phone]     (approximate)  I have reviewed the triage vital signs and the nursing notes.   HISTORY  Chief Complaint Rectal Bleeding and Dizziness    HPI Shane [name] is a 58 y.o. male with medical history as listed below who presents on his 20th ED visit in the last 2 months.  Tonight he is requesting evaluation for reported blood in his stool and blood when he wipes after a bowel movement as well as his chronic back pain.  He said that he has seen blood on the toilet paper when he wipes and he told about history where his hemoglobin dropped very low and he required blood transfusions and has had colonoscopies by Dr. Mechele Collin in the past.  It was unclear how much blood he is seen recently.  He denies any abdominal pain.  He is eating and drinking well and confirmed what was reported to be in by the nursing staff, that he has been seen at least twice recently eating a large meal in the hospital cafeteria both before and after emergency department visits.  He said that he is concerned his skin might be the wrong color and that he could need some fluids.  He has had no chest pain or shortness of breath, no vomiting, no dysuria.  He says that his back pain continues to be a problem.  He has not followed up with pain management.  He tried to follow-up with her nodal clinic and was told that he needs to be in [facility] as indicated by the emergency department and explained that pain management is not handled in [place] clinic.  According to the medical record he was given a 6-day taper of prednisone.  It is unclear if he filled this prescription.  Nothing in particular makes the symptoms better or worse.  He is ambulatory without difficulty.  No fever or chills.         Past Medical History:  Diagnosis Date   Anemia     Anxiety    Drug-seeking behavior    hx of narcotics dependence, previously discharged from pain management clinic   Encounter for blood transfusion 2005, 02/2014   GERD (gastroesophageal reflux disease)    Hemorrhoid    Herniated cervical disc     Patient Active Problem List   Diagnosis Date Noted   Antisocial personality disorder (HCC)    Malingering    Hemorrhoid 05/09/2014   Skin abnormality 05/09/2014    Past Surgical History:  Procedure Laterality Date   CERVICAL FUSION     COLONOSCOPY  2/16   Dr. Mechele Collin   FRACTURE SURGERY Left    fractured femur   prolapsed rectum surgery  04/30/1995   [facility], trans abdominal mesh repair per patient report   SEPTOPLASTY      Prior to Admission medications   Medication Sig Start Date End Date Taking? Authorizing Provider  cyclobenzaprine (FLEXERIL) 10 MG tablet Take 1 tablet by mouth 2 (two) times daily as needed. 09/04/16   [provider]  docusate sodium (COLACE) 100 MG capsule Take 250 mg by mouth 2 (two) times daily.     [provider]  Ferrous Sulfate (IRON) 325 (65 FE) MG TABS Take 1 tablet by mouth daily.    [provider]  fluticasone (FLONASE) 50 MCG/ACT nasal spray Place 1 spray into both nostrils daily.  04/20/14   [provider]  gabapentin (NEURONTIN) 100 MG capsule Take 1 capsule (100 mg total) by mouth 3 (three) times daily. 11/13/18 11/13/19  Nita Sickle, MD  glycerin adult 2 g suppository Place 1 suppository rectally as needed for constipation. 09/25/17   Arnaldo Natal, MD  hydrocortisone (ANUSOL-HC) 25 MG suppository Place 1 suppository (25 mg total) rectally 2 (two) times daily. 05/22/14   Earline Mayotte, MD  hydrocortisone (PROCTOSOL HC) 2.5 % rectal cream Place 1 application rectally 2 (two) times daily. 02/27/17   Emily Filbert, MD  hydrocortisone 2.5 % cream Apply 1 application topically daily. 11/30/16   [provider]    hydrocortisone-pramoxine (PROCTOFOAM HC) rectal foam Place 1 applicator rectally 2 (two) times daily. 12/22/16   Loleta Rose, MD  hydrOXYzine (ATARAX/VISTARIL) 10 MG tablet Take 1 tablet (10 mg total) by mouth 3 (three) times daily as needed for itching. 04/12/16   Schaevitz, Myra Rude, MD  ketorolac (TORADOL) 10 MG tablet Take 1 tablet (10 mg total) by mouth every 6 (six) hours as needed. 11/21/18   Don Perking, [place], MD  lidocaine (LIDODERM) 5 % Place 1 patch onto the skin every 12 (twelve) hours for 5 days. Remove & Discard patch within 12 hours or as directed by MD 11/20/18 11/25/18  Irean Hong, MD  meloxicam (MOBIC) 15 MG tablet Take 1 tablet (15 mg total) by mouth daily. 11/21/18   Cuthriell, Delorise Royals, PA-C  methocarbamol (ROBAXIN) 750 MG tablet Take 1 tablet (750 mg total) by mouth every 8 (eight) hours as needed for muscle spasms. 11/13/18   Don Perking, [place], MD  omeprazole (PRILOSEC) 20 MG capsule Take 20 mg by mouth daily.  04/16/14   [provider]  pantoprazole (PROTONIX) 40 MG tablet Take 40 mg by mouth daily. 11/30/16   [provider]  terbinafine (LAMISIL) 250 MG tablet Take 250 mg by mouth daily. 08/11/16   [provider]    Allergies Codeine and Amoxicillin  No family history on file.  Social History Social History   Tobacco Use   Smoking status: Former Smoker    Years: 20.00    Quit date: 01/05/2005    Years since quitting: 13.8   Smokeless tobacco: Current User  Substance Use Topics   Alcohol use: Not Currently    Alcohol/week: 0.0 standard drinks   Drug use: No    Review of Systems Constitutional: No fever/chills Eyes: No visual changes. ENT: No sore throat. Cardiovascular: Denies chest pain. Respiratory: Denies shortness of breath. Gastrointestinal: Reportedly having bright red blood per rectum.  No abdominal pain.  No nausea, no vomiting.   Genitourinary: Negative for dysuria.  No incontinence nor urinary  retention. Musculoskeletal: Chronic low back pain. Integumentary: Negative for rash. Neurological: Negative for headaches, focal weakness or numbness.   ____________________________________________   PHYSICAL EXAM:  VITAL SIGNS: ED Triage Vitals  Enc Vitals Group     BP 11/24/18 0048 133/76     Pulse Rate 11/24/18 0048 94     Resp 11/24/18 0048 16     Temp 11/24/18 0048 98.7 F (37.1 C)     Temp Source 11/24/18 0048 Oral     SpO2 11/24/18 0048 100 %     Weight 11/24/18 0043 72.6 kg (160 lb)     Height 11/24/18 0043 1.778 m ( )     Head Circumference --      Peak Flow --      Pain Score 11/24/18 0043 10  Pain Loc --      Pain Edu? --      Excl. in GC? --     Constitutional: Alert and oriented.  No acute distress. Eyes: Conjunctivae are normal.  Head: Atraumatic. Nose: No congestion/rhinnorhea. Mouth/Throat: Patient is wearing a mask. Neck: No stridor.  No meningeal signs.   Cardiovascular: Normal rate, regular rhythm. Good peripheral circulation. Grossly normal heart sounds. Respiratory: Normal respiratory effort.  No retractions. Gastrointestinal: Soft and nondistended.  Patient reports mild diffuse tenderness all throughout the abdomen without any focal tenderness and no rebound nor guarding.  The patient refused rectal exam. Musculoskeletal: No lower extremity tenderness nor  deformities of extremities.  Patient ambulatory without difficulty and without limp. Neurologic:  Normal speech and language. No gross focal neurologic deficits are appreciated.  Skin:  Skin is warm, dry and intact.   ____________________________________________   LABS (all labs ordered are listed, but only abnormal results are displayed)  Labs Reviewed  CBC WITH DIFFERENTIAL/PLATELET - Abnormal; Notable for the following components:      Result Value   RBC 4.17 (*)    All other components within normal limits  COMPREHENSIVE METABOLIC PANEL - Abnormal; Notable for the following  components:   Glucose, Bld 105 (*)    Total Protein 6.2 (*)    All other components within normal limits  LIPASE, BLOOD  TYPE AND SCREEN  TROPONIN I (HIGH SENSITIVITY)   ____________________________________________  EKG  ED ECG REPORT I, Loleta Rose, the attending physician, personally viewed and interpreted this ECG.  Date: 11/24/2018 EKG Time: 00: 51 Rate: 92 Rhythm: normal sinus rhythm QRS Axis: normal Intervals: normal ST/T Wave abnormalities: normal Narrative Interpretation: no evidence of acute ischemia  ____________________________________________  RADIOLOGY I, Loleta Rose, personally viewed and evaluated these images (plain radiographs) as part of my medical decision making, as well as reviewing the written report by the radiologist.  ED MD interpretation: No indication for emergent imaging  Official radiology report(s): No results found.  ____________________________________________   PROCEDURES   Procedure(s) performed (including Critical Care):  Procedures   ____________________________________________   INITIAL IMPRESSION / MDM / ASSESSMENT AND PLAN / ED COURSE  As part of my medical decision making, I reviewed the following data within the electronic MEDICAL RECORD NUMBER Nursing notes reviewed and incorporated, Labs reviewed , Old chart reviewed, Notes from prior ED visits and [place] Controlled Substance Database   Differential diagnosis includes, but is not limited to, chronic back pain, diverticular bleed, AV malformation, hemorrhoids, malingering, somatic symptom disorder, narcotic seeking.  ED nurse Vara Guardian" Hazle Coca was present in the room throughout my history and physical with the patient.  This is a challenging patient.  He has been seen numerous times in this emergency department and is gone at least to [facility] recently.  He has a history of drug-seeking behavior and reportedly was discharged from a pain management facility  previously although he says he was let go from his orthopedics clinic because his girlfriend at the time lied about him giving away his narcotics.  He has not followed up with outpatient management options other than trying to go to [facility] within the last week.  It is notable that he has been seen by ED staff who now knows him by site walking around in the hospital in the middle of the night after being discharged from the ED.  He has been seen multiple times eating large meals in the hospital cafeteria.  He does not seem to  be in any distress or pain.  I reassured him that his vital signs are stable and his lab work is stable including a normal hemoglobin.  There is no evidence of any volume depletion and I pointed out that if he is eating and drinking well with normal vital signs and normal lab work it is very unlikely that he is dehydrated and there is no indication that he needs IV fluids.  I offered to perform a rectal exam to verify the presence of GI bleeding and he said that he is not bleeding right now so he does not want the exam.  He showed me 2 pictures of bright red blood in the toilet on his phone as evidence of his GI bleeding, but I pointed out that one of the photos was taken in 2019 and one of the photos was taken in 2018.  After I pointed out that these were not new photos, he said he was just showing me the problems that he has had in the past.  I pointed out that this is why he should allow me to perform a rectal exam to verify whether or not he has current bleeding and he continued to refuse the digital rectal exam.  I attempted to reach out to the patient and offer other types of support.  I pointed out his numerous visits and ask if he had a place to stay or if there are other issues that are bothering him that could be the reason for his many visits to the emergency department.  We went over the fact that he has been told multiple times about the chronic pain policy so he knows  that we cannot give him pain medicine and ask what else is going on that is leading to his emergency department visits.  I also pointed out that it is unusual for a patient who is been discharged to be walking around in the hospital hours after discharge or before he checks and, going to the cafeteria, etc.  I pointed out to him what he has been told in the past, that the emergency department has nothing else to offer in terms of his chronic pain.  He became somewhat agitated and irritated although he did not start yelling or cursing at me, he only cursed when talking about prior experiences such as when he was discharged from the pain clinic.  He said "so you are telling me I should not come back anymore, because it is my right to do so and by law you have to see me".  I pointed out that of course it is his right to be seen in fact he has been seen 20 times, I was simply explaining again that we do not have other interventions that will be helpful for him regarding his back pain but that he is always welcome to return to the emergency department for new or worsening symptoms.  I told him I would provide him with information regarding the pain management clinic as well as a new GI doctor since Dr. Mechele CollinElliott is no longer here if he continues to report rectal bleeding.  He was clearly upset but seem to understand what I was telling him.  After I left the room and was involved another patient care, he reportedly asked the nurse if he could have some Toradol which she is got multiple times in the past.  However I explained through the nurse that he cannot have Toradol given that he is reporting rectal  bleeding as this could worsen his symptoms and I encouraged him to follow-up as discussed previously.          ____________________________________________  FINAL CLINICAL IMPRESSION(S) / ED DIAGNOSES  Final diagnoses:  Chronic back pain greater than 3 months duration  Rectal bleeding (reported, not verified)      MEDICATIONS GIVEN DURING THIS VISIT:  Medications - No data to display   ED Discharge Orders    None      *Please note:  Shane [name] was evaluated in Emergency Department on 11/24/2018 for the symptoms described in the history of present illness. He was evaluated in the context of the global COVID-19 pandemic, which necessitated consideration that the patient might be at risk for infection with the SARS-CoV-2 virus that causes COVID-19. Institutional protocols and algorithms that pertain to the evaluation of patients at risk for COVID-19 are in a state of rapid change based on information released by regulatory bodies including the CDC and federal and state organizations. These policies and algorithms were followed during the patient's care in the ED.  Some ED evaluations and interventions may be delayed as a result of limited staffing during the pandemic.*  Note:  This document was prepared using Dragon voice recognition software and may include unintentional dictation errors.   Hinda Kehr, MD 11/24/18 [phone]

## 2018-11-24 NOTE — ED Notes (Signed)
Patient discharged to home per MD order. Patient in stable condition, and deemed medically cleared by ED provider for discharge. Discharge instructions reviewed with patient/family using "Teach Back"; verbalized understanding of medication education and administration, and information about follow-up care. Denies further concerns. ° °

## 2018-11-26 ENCOUNTER — Emergency Department
Admission: EM | Admit: 2018-11-26 | Discharge: 2018-11-26 | Disposition: A | Discharge status: Home / Self Care | Payer: Medicaid Other | Attending: Emergency Medicine | Admitting: Emergency Medicine

## 2018-11-26 ENCOUNTER — Other Ambulatory Visit: Payer: Self-pay

## 2018-11-26 DIAGNOSIS — M25571 Pain in right ankle and joints of right foot: Secondary | ICD-10-CM | POA: Diagnosis not present

## 2018-11-26 DIAGNOSIS — R1030 Lower abdominal pain, unspecified: Secondary | ICD-10-CM | POA: Diagnosis present

## 2018-11-26 DIAGNOSIS — M79604 Pain in right leg: Secondary | ICD-10-CM | POA: Insufficient documentation

## 2018-11-26 DIAGNOSIS — M25572 Pain in left ankle and joints of left foot: Secondary | ICD-10-CM | POA: Diagnosis not present

## 2018-11-26 DIAGNOSIS — Z79899 Other long term (current) drug therapy: Secondary | ICD-10-CM | POA: Insufficient documentation

## 2018-11-26 DIAGNOSIS — Z5321 Procedure and treatment not carried out due to patient leaving prior to being seen by health care provider: Secondary | ICD-10-CM | POA: Insufficient documentation

## 2018-11-26 DIAGNOSIS — G8929 Other chronic pain: Secondary | ICD-10-CM | POA: Insufficient documentation

## 2018-11-26 DIAGNOSIS — Z87891 Personal history of nicotine dependence: Secondary | ICD-10-CM | POA: Insufficient documentation

## 2018-11-26 LAB — COMPREHENSIVE METABOLIC PANEL
ALT: 23 U/L (ref 0–44)
AST: 24 U/L (ref 15–41)
Albumin: 3.8 g/dL (ref 3.5–5.0)
Alkaline Phosphatase: 88 U/L (ref 38–126)
Anion gap: 8 (ref 5–15)
BUN: 13 mg/dL (ref 6–20)
CO2: 28 mmol/L (ref 22–32)
Calcium: 9.5 mg/dL (ref 8.9–10.3)
Chloride: 104 mmol/L (ref 98–111)
Creatinine, Ser: 0.77 mg/dL (ref 0.61–1.24)
GFR calc Af Amer: >60 mL/min (ref >60)
GFR calc non Af Amer: >60 mL/min (ref >60)
Glucose, Bld: 97 mg/dL (ref 70–99)
Potassium: 4.2 mmol/L (ref 3.5–5.1)
Sodium: 140 mmol/L (ref 135–145)
Total Bilirubin: 0.5 mg/dL (ref 0.3–1.2)
Total Protein: 6.8 g/dL (ref 6.5–8.1)

## 2018-11-26 LAB — CBC
HCT: 42.3 % (ref 39.0–52.0)
Hemoglobin: 14.3 g/dL (ref 13.0–17.0)
MCH: 32.2 pg (ref 26.0–34.0)
MCHC: 33.8 g/dL (ref 30.0–36.0)
MCV: 95.3 fL (ref 80.0–100.0)
Platelets: 234 10*3/uL (ref 150–400)
RBC: 4.44 MIL/uL (ref 4.22–5.81)
RDW: 12.9 % (ref 11.5–15.5)
WBC: 8.3 10*3/uL (ref 4.0–10.5)
nRBC: 0 % (ref 0.0–0.2)

## 2018-11-26 LAB — LIPASE, BLOOD: Lipase: 52 U/L — ABNORMAL HIGH (ref 11–51)

## 2018-11-26 MED ORDER — IOHEXOL 9 MG/ML PO SOLN: 500.0000 mL | Freq: Once | ORAL | Status: DC | PRN

## 2018-11-26 NOTE — ED Triage Notes (Addendum)
Patient c/o lower abdominal cramping, and right leg/ankle pain. Patient reports leg pain has been ongoing for 6 years; abdominal pain is new.

## 2018-11-26 NOTE — ED Notes (Signed)
Informed patient that was taking him to a treatment room and patient states I'm going home.  Again told patient we have a ready treatment room and could take him back and patient said no he was going home.

## 2018-11-26 NOTE — ED Notes (Signed)
Patient up to stat desk asking about vending machines and why its not working.  Explained to patient that machines have been taking money and usually only take coins.  Also explained to patient not to eat or drink because MD wants him to have a CT of his abdomen.  Patient refusing CT at this time until he speaks to the doctor.

## 2018-11-27 ENCOUNTER — Emergency Department
Admission: EM | Admit: 2018-11-27 | Discharge: 2018-11-27 | Disposition: A | Discharge status: Home / Self Care | Payer: Medicaid Other | Source: Home / Self Care | Attending: Emergency Medicine | Admitting: Emergency Medicine

## 2018-11-27 DIAGNOSIS — M79604 Pain in right leg: Secondary | ICD-10-CM

## 2018-11-27 NOTE — ED Triage Notes (Signed)
Patient back to the ER today for "the same thing." Asked patient why he is here again today and he states because I want my leg looked at.

## 2018-11-27 NOTE — Discharge Instructions (Addendum)
Please seek medical attention for any high fevers, chest pain, shortness of breath, change in behavior, persistent vomiting, bloody stool or any other new or concerning symptoms.  

## 2018-11-27 NOTE — ED Provider Notes (Signed)
[facility] Emergency Department Provider Note  ____________________________________________   I have reviewed the triage vital signs and the nursing notes.   HISTORY  Chief Complaint Leg Pain   History limited by: Not Limited   HPI Shane [name] is a 58 y.o. male who presents to the emergency department today with concern for right leg pain.  He states that this leg pain has been present for a long time.  Apparently he had a cervical spine surgery in the pain has been present since then.  He describes the pain going down his leg.  He says that he has tried taking Tylenol without any significant relief.  He does state that he was seen in the emergency department recently for abdominal pain. Says he has appointment scheduled with PCP next week.  Records reviewed. Per medical record review patient has a history of frequent visits to the emergency department.   Past Medical History:  Diagnosis Date  . Anemia   . Anxiety   . Drug-seeking behavior    hx of narcotics dependence, previously discharged from pain management clinic  . Encounter for blood transfusion 2005, 02/2014  . GERD (gastroesophageal reflux disease)   . Hemorrhoid   . Herniated cervical disc     Patient Active Problem List   Diagnosis Date Noted  . Antisocial personality disorder (HCC)   . Malingering   . Hemorrhoid 05/09/2014  . Skin abnormality 05/09/2014    Past Surgical History:  Procedure Laterality Date  . CERVICAL FUSION    . COLONOSCOPY  2/16   Dr. Mechele Collin  . FRACTURE SURGERY Left    fractured femur  . prolapsed rectum surgery  04/30/1995   [facility], trans abdominal mesh repair per patient report  . SEPTOPLASTY      Prior to Admission medications   Medication Sig Start Date End Date Taking? Authorizing Provider  cyclobenzaprine (FLEXERIL) 10 MG tablet Take 1 tablet by mouth 2 (two) times daily as needed. 09/04/16   [provider]  docusate sodium  (COLACE) 100 MG capsule Take 250 mg by mouth 2 (two) times daily.     [provider]  Ferrous Sulfate (IRON) 325 (65 FE) MG TABS Take 1 tablet by mouth daily.    [provider]  fluticasone (FLONASE) 50 MCG/ACT nasal spray Place 1 spray into both nostrils daily.  04/20/14   [provider]  gabapentin (NEURONTIN) 100 MG capsule Take 1 capsule (100 mg total) by mouth 3 (three) times daily. 11/13/18 11/13/19  Nita Sickle, MD  glycerin adult 2 g suppository Place 1 suppository rectally as needed for constipation. 09/25/17   Arnaldo Natal, MD  hydrocortisone (ANUSOL-HC) 25 MG suppository Place 1 suppository (25 mg total) rectally 2 (two) times daily. 05/22/14   Earline Mayotte, MD  hydrocortisone (PROCTOSOL HC) 2.5 % rectal cream Place 1 application rectally 2 (two) times daily. 02/27/17   Emily Filbert, MD  hydrocortisone 2.5 % cream Apply 1 application topically daily. 11/30/16   [provider]  hydrocortisone-pramoxine (PROCTOFOAM HC) rectal foam Place 1 applicator rectally 2 (two) times daily. 12/22/16   Loleta Rose, MD  hydrOXYzine (ATARAX/VISTARIL) 10 MG tablet Take 1 tablet (10 mg total) by mouth 3 (three) times daily as needed for itching. 04/12/16   Schaevitz, Myra Rude, MD  ketorolac (TORADOL) 10 MG tablet Take 1 tablet (10 mg total) by mouth every 6 (six) hours as needed. 11/21/18   Nita Sickle, MD  meloxicam (MOBIC) 15 MG  tablet Take 1 tablet (15 mg total) by mouth daily. 11/21/18   Cuthriell, Delorise RoyalsJonathan D, PA-C  methocarbamol (ROBAXIN) 750 MG tablet Take 1 tablet (750 mg total) by mouth every 8 (eight) hours as needed for muscle spasms. 11/13/18   Don PerkingVeronese, [place], MD  omeprazole (PRILOSEC) 20 MG capsule Take 20 mg by mouth daily.  04/16/14   [provider]  pantoprazole (PROTONIX) 40 MG tablet Take 40 mg by mouth daily. 11/30/16   [provider]  terbinafine (LAMISIL) 250 MG tablet Take 250 mg by mouth daily.  08/11/16   [provider]    Allergies Codeine and Amoxicillin  No family history on file.  Social History Social History   Tobacco Use  . Smoking status: Former Smoker    Years: 20.00    Quit date: 01/05/2005    Years since quitting: 13.9  . Smokeless tobacco: Current User  Substance Use Topics  . Alcohol use: Not Currently    Alcohol/week: 0.0 standard drinks  . Drug use: No    Review of Systems Constitutional: No fever/chills Eyes: No visual changes. ENT: No sore throat. Cardiovascular: Denies chest pain. Respiratory: Denies shortness of breath. Gastrointestinal: Positive for abdominal pain. Genitourinary: Negative for dysuria. Musculoskeletal: Positive for right leg pain. Skin: Negative for rash. Neurological: Negative for headaches, focal weakness or numbness.  ____________________________________________   PHYSICAL EXAM:  VITAL SIGNS: ED Triage Vitals  Enc Vitals Group     BP 11/27/18 0016 (!) 143/85     Pulse Rate 11/27/18 0016 87     Resp 11/27/18 0016 20     Temp 11/27/18 0016 98 F (36.7 C)     Temp Source 11/27/18 0016 Oral     SpO2 11/27/18 0016 100 %     Weight 11/27/18 0017 161 lb (73 kg)     Height 11/27/18 0017 5\' 9"  (1.753 m)     Head Circumference --      Peak Flow --      Pain Score 11/27/18 0017 8   Constitutional: Alert and oriented.  Eyes: Conjunctivae are normal.  ENT      Head: Normocephalic and atraumatic.      Nose: No congestion/rhinnorhea.      Mouth/Throat: Mucous membranes are moist.      Neck: No stridor. Hematological/Lymphatic/Immunilogical: No cervical lymphadenopathy. Cardiovascular: Normal rate, regular rhythm.  No murmurs, rubs, or gallops.  Respiratory: Normal respiratory effort without tachypnea nor retractions. Breath sounds are clear and equal bilaterally. No wheezes/rales/rhonchi. Gastrointestinal: Soft and non tender. No rebound. No guarding.  Genitourinary: Deferred Musculoskeletal: Normal range of  motion in all extremities. No lower extremity edema. Neurologic:  Normal speech and language. No gross focal neurologic deficits are appreciated.  Skin:  Skin is warm, dry and intact. No rash noted. Psychiatric: Mood and affect are normal. Speech and behavior are normal. Patient exhibits appropriate insight and judgment.  ____________________________________________    LABS (pertinent positives/negatives)  None  ____________________________________________   EKG  None  ____________________________________________    RADIOLOGY  None  ____________________________________________   PROCEDURES  Procedures  ____________________________________________   INITIAL IMPRESSION / ASSESSMENT AND PLAN / ED COURSE  Pertinent labs & imaging results that were available during my care of the patient were reviewed by me and considered in my medical decision making (see chart for details).   Patient presented to the emergency department today because of concerns for chronic right leg pain.  Patient has been seen in the emergency department multiple times over the  past few months.  He did have blood work drawn a little over 24 hours ago without any concerning findings.  At this point I discussed with patient importance of following up with primary care.  ____________________________________________   FINAL CLINICAL IMPRESSION(S) / ED DIAGNOSES  Final diagnoses:  Right leg pain     Note: This dictation was prepared with Dragon dictation. Any transcriptional errors that result from this process are unintentional     Nance Pear, MD 11/27/18 0401

## 2018-11-27 NOTE — ED Notes (Signed)
Patient ambulating outside in no acute distress.

## 2018-11-30 ENCOUNTER — Emergency Department
Admission: EM | Admit: 2018-11-30 | Discharge: 2018-11-30 | Disposition: A | Discharge status: Home / Self Care | Payer: Medicaid Other | Attending: Emergency Medicine | Admitting: Emergency Medicine

## 2018-11-30 ENCOUNTER — Other Ambulatory Visit: Payer: Self-pay

## 2018-11-30 ENCOUNTER — Encounter: Payer: Self-pay | Admitting: Emergency Medicine

## 2018-11-30 DIAGNOSIS — M549 Dorsalgia, unspecified: Secondary | ICD-10-CM | POA: Insufficient documentation

## 2018-11-30 DIAGNOSIS — Z5321 Procedure and treatment not carried out due to patient leaving prior to being seen by health care provider: Secondary | ICD-10-CM | POA: Diagnosis not present

## 2018-11-30 NOTE — ED Notes (Addendum)
Pt called from lobby with no reply. Unable to locate pt in lobby or just outside waiting room doors.

## 2018-11-30 NOTE — ED Triage Notes (Signed)
Patient to ER for c/o mid back pain after feeling pop in back when getting up from bed earlier this afternoon. Patient also reports having sore inside his left nostril.

## 2018-11-30 NOTE — ED Notes (Signed)
Pt called from lobby with no reply. Unable to locate pt at this time.  

## 2018-12-01 ENCOUNTER — Other Ambulatory Visit: Payer: Self-pay

## 2018-12-01 ENCOUNTER — Encounter: Payer: Self-pay | Admitting: Emergency Medicine

## 2018-12-01 ENCOUNTER — Emergency Department
Admission: EM | Admit: 2018-12-01 | Discharge: 2018-12-01 | Disposition: A | Discharge status: Home / Self Care | Payer: Medicaid Other | Attending: Emergency Medicine | Admitting: Emergency Medicine

## 2018-12-01 ENCOUNTER — Emergency Department
Admission: EM | Admit: 2018-12-01 | Discharge: 2018-12-01 | Disposition: A | Discharge status: Home / Self Care | Payer: Medicaid Other | Source: Home / Self Care

## 2018-12-01 DIAGNOSIS — Z5321 Procedure and treatment not carried out due to patient leaving prior to being seen by health care provider: Secondary | ICD-10-CM | POA: Diagnosis not present

## 2018-12-01 DIAGNOSIS — J3489 Other specified disorders of nose and nasal sinuses: Secondary | ICD-10-CM | POA: Diagnosis present

## 2018-12-01 DIAGNOSIS — F17290 Nicotine dependence, other tobacco product, uncomplicated: Secondary | ICD-10-CM | POA: Insufficient documentation

## 2018-12-01 DIAGNOSIS — Z79899 Other long term (current) drug therapy: Secondary | ICD-10-CM | POA: Insufficient documentation

## 2018-12-01 DIAGNOSIS — M5441 Lumbago with sciatica, right side: Secondary | ICD-10-CM | POA: Insufficient documentation

## 2018-12-01 DIAGNOSIS — G8929 Other chronic pain: Secondary | ICD-10-CM

## 2018-12-01 MED ORDER — TRAMADOL HCL 50 MG PO TABS: 50.0000 mg | ORAL_TABLET | Freq: Two times a day (BID) | ORAL | 0 refills | Status: AC | PRN

## 2018-12-01 MED ORDER — TRAMADOL HCL 50 MG PO TABS: 50.0000 mg | ORAL_TABLET | Freq: Two times a day (BID) | ORAL | 0 refills | Status: DC | PRN

## 2018-12-01 NOTE — ED Notes (Signed)
No answer when called several times from lobby 

## 2018-12-01 NOTE — ED Provider Notes (Signed)
[facility] Emergency Department Provider Note  ____________________________________________  Time seen: Approximately 11:08 PM  I have reviewed the triage vital signs and the nursing notes.   HISTORY  Chief Complaint Back Pain    HPI Shane [name] is a 58 y.o. male that presents to the emergency department for evaluation of acute on chronic right back pain that radiates [name] right leg.  Patient states that he was playing with his great niece tonight when he flared up his back.  He has had back pain since 2012.  He has been seeing Dr. Wynn Banker in [facility] for back pain for 3 years.  He tried to recently schedule an MRI but was told that he needed the order renewed by Dr. Wynn Banker.  He has an appointment scheduled with Dr. Wynn Banker at the beginning of December.  He is trying to get into a pain clinic but was told that they needed medical records and he states that this has been a slow process.  No bowel or bladder dysfunction or saddle anesthesias.  No fevers or IV drug use.  Pain is the same as his previous back pain.  Patient has had several ER visits here since September.  He was seen by [facility] primary [facility] 2 days ago and was told to come to the ER if he cannot control the pain.  He was given tramadol for pain here in the past, which helps.  He has also been given Toradol here in the past, which has not helped.  He takes methocarbamol daily.  Patient drove himself to the emergency department.  Past Medical History:  Diagnosis Date  . Anemia   . Anxiety   . Drug-seeking behavior    hx of narcotics dependence, previously discharged from pain management clinic  . Encounter for blood transfusion 2005, 02/2014  . GERD (gastroesophageal reflux disease)   . Hemorrhoid   . Herniated cervical disc     Patient Active Problem List   Diagnosis Date Noted  . Antisocial personality disorder ([place])   . Malingering   . Hemorrhoid 05/09/2014  . Skin abnormality 05/09/2014    Past  Surgical History:  Procedure Laterality Date  . CERVICAL FUSION    . COLONOSCOPY  2/16   Dr. Vira Agar  . FRACTURE SURGERY Left    fractured femur  . prolapsed rectum surgery  04/30/1995   [facility], trans abdominal mesh repair per patient report  . SEPTOPLASTY      Prior to Admission medications   Medication Sig Start Date End Date Taking? Authorizing Provider  cyclobenzaprine (FLEXERIL) 10 MG tablet Take 1 tablet by mouth 2 (two) times daily as needed. 09/04/16   [provider]  docusate sodium (COLACE) 100 MG capsule Take 250 mg by mouth 2 (two) times daily.     [provider]  Ferrous Sulfate (IRON) 325 (65 FE) MG TABS Take 1 tablet by mouth daily.    [provider]  fluticasone (FLONASE) 50 MCG/ACT nasal spray Place 1 spray into both nostrils daily.  04/20/14   [provider]  gabapentin (NEURONTIN) 100 MG capsule Take 1 capsule (100 mg total) by mouth 3 (three) times daily. 11/13/18 11/13/19  Rudene Re, MD  glycerin adult 2 g suppository Place 1 suppository rectally as needed for constipation. 09/25/17   Nena Polio, MD  hydrocortisone (ANUSOL-HC) 25 MG suppository Place 1 suppository (25 mg total) rectally 2 (two) times daily. 05/22/14   Robert Bellow, MD  hydrocortisone (PROCTOSOL  HC) 2.5 % rectal cream Place 1 application rectally 2 (two) times daily. 02/27/17   Emily Filbert, MD  hydrocortisone 2.5 % cream Apply 1 application topically daily. 11/30/16   [provider]  hydrocortisone-pramoxine (PROCTOFOAM HC) rectal foam Place 1 applicator rectally 2 (two) times daily. 12/22/16   Loleta Rose, MD  hydrOXYzine (ATARAX/VISTARIL) 10 MG tablet Take 1 tablet (10 mg total) by mouth 3 (three) times daily as needed for itching. 04/12/16   Schaevitz, Myra Rude, MD  ketorolac (TORADOL) 10 MG tablet Take 1 tablet (10 mg total) by mouth every 6 (six) hours as needed. 11/21/18   Nita Sickle, MD  meloxicam (MOBIC)  15 MG tablet Take 1 tablet (15 mg total) by mouth daily. 11/21/18   Cuthriell, Delorise Royals, PA-C  methocarbamol (ROBAXIN) 750 MG tablet Take 1 tablet (750 mg total) by mouth every 8 (eight) hours as needed for muscle spasms. 11/13/18   Don Perking, [place], MD  omeprazole (PRILOSEC) 20 MG capsule Take 20 mg by mouth daily.  04/16/14   [provider]  pantoprazole (PROTONIX) 40 MG tablet Take 40 mg by mouth daily. 11/30/16   [provider]  terbinafine (LAMISIL) 250 MG tablet Take 250 mg by mouth daily. 08/11/16   [provider]  traMADol (ULTRAM) 50 MG tablet Take 1 tablet (50 mg total) by mouth every 12 (twelve) hours as needed for up to 2 days. 12/01/18 12/03/18  Enid Derry, PA-C    Allergies Codeine and Amoxicillin  No family history on file.  Social History Social History   Tobacco Use  . Smoking status: Former Smoker    Years: 20.00    Quit date: 01/05/2005    Years since quitting: 13.9  . Smokeless tobacco: Current User  Substance Use Topics  . Alcohol use: Not Currently    Alcohol/week: 0.0 standard drinks  . Drug use: No     Review of Systems  Constitutional: No fever/chills Gastrointestinal: No abdominal pain.  No nausea, no vomiting.  Musculoskeletal: Positive for back pain. Skin: Negative for rash, abrasions, lacerations, ecchymosis. Neurological: Negative for numbness or tingling   ____________________________________________   PHYSICAL EXAM:  VITAL SIGNS: ED Triage Vitals  Enc Vitals Group     BP 12/01/18 2213 (!) 147/87     Pulse Rate 12/01/18 2213 93     Resp 12/01/18 2213 16     Temp 12/01/18 2213 98.4 F (36.9 C)     Temp Source 12/01/18 2213 Oral     SpO2 12/01/18 2213 99 %     Weight 12/01/18 2213 161 lb (73 kg)     Height 12/01/18 2213 5' 9.5" (1.765 m)     Head Circumference --      Peak Flow --      Pain Score 12/01/18 2254 10     Pain Loc --      Pain Edu? --      Excl. in GC? --      Constitutional: Alert  and oriented. Well appearing and in no acute distress. Eyes: Conjunctivae are normal. PERRL. EOMI. Head: Atraumatic. ENT:      Ears:      Nose: No congestion/rhinnorhea.      Mouth/Throat: Mucous membranes are moist.  Neck: No stridor.  Cardiovascular: Normal rate, regular rhythm.  Good peripheral circulation. Respiratory: Normal respiratory effort without tachypnea or retractions. Lungs CTAB. Good air entry to the bases with no decreased or absent breath sounds. Musculoskeletal: Full range of motion to all extremities.  No gross deformities appreciated.  No pinpoint tenderness to palpation to lumbar spine or lumbar paraspinal muscles.  Full range of motion of bilateral hips.  Strength equal in lower extremities bilaterally.  Able to plantar and dorsiflex right foot.  Able to move all 5 toes. Neurologic:  Normal speech and language. No gross focal neurologic deficits are appreciated.  Skin:  Skin is warm, dry and intact. No rash noted. Psychiatric: Mood and affect are normal. Speech and behavior are normal. Patient exhibits appropriate insight and judgement.   ____________________________________________   LABS (all labs ordered are listed, but only abnormal results are displayed)  Labs Reviewed - No data to display ____________________________________________  EKG   ____________________________________________  RADIOLOGY  No results found.  ____________________________________________    PROCEDURES  Procedure(s) performed:    Procedures    Medications - No data to display   ____________________________________________   INITIAL IMPRESSION / ASSESSMENT AND PLAN / ED COURSE  Pertinent labs & imaging results that were available during my care of the patient were reviewed by me and considered in my medical decision making (see chart for details).  Review of the [place] CSRS was performed in accordance of the [facility] prior to dispensing any controlled drugs.   Patient  presented to the emergency department for evaluation of acute on chronic back pain.  Vital signs and exam are reassuring.  Back pain feels the same as his previous flareups.  Patient has a appointment with his back surgeon at the beginning of December.  Patient will be discharged home with prescriptions for 4 tramadol pills for extreme pain.  Patient is to follow up with back orthopedics as directed. Patient is given ED precautions to return to the ED for any worsening or new symptoms.   Shane [name] was evaluated in Emergency Department on 12/01/2018 for the symptoms described in the history of present illness. He was evaluated in the context of the global COVID-19 pandemic, which necessitated consideration that the patient might be at risk for infection with the SARS-CoV-2 virus that causes COVID-19. Institutional protocols and algorithms that pertain to the evaluation of patients at risk for COVID-19 are in a state of rapid change based on information released by regulatory bodies including the CDC and federal and state organizations. These policies and algorithms were followed during the patient's care in the ED.  ____________________________________________  FINAL CLINICAL IMPRESSION(S) / ED DIAGNOSES  Final diagnoses:  Chronic right-sided low back pain with right-sided sciatica      NEW MEDICATIONS STARTED DURING THIS VISIT:  ED Discharge Orders         Ordered    traMADol (ULTRAM) 50 MG tablet  Every 12 hours PRN,   Status:  Discontinued     12/01/18 2251    traMADol (ULTRAM) 50 MG tablet  Every 12 hours PRN     12/01/18 2304              This chart was dictated using voice recognition software/Dragon. Despite best efforts to proofread, errors can occur which can change the meaning. Any change was purely unintentional.    Enid DerryWagner, Ananda Sitzer, PA-C 12/01/18 2335    Miguel AschoffMonks, Sarah L., MD 12/02/18 0000

## 2018-12-01 NOTE — ED Triage Notes (Signed)
Pt arrives to ED via POV for chronic back pain and sore nose. Pt seen here several times recently for same. No new injury or trauma. Pt is A&O, in NAD; RR even, regular, and unlabored.

## 2018-12-01 NOTE — ED Triage Notes (Signed)
Patient ambulatory to triage with steady gait, without difficulty or distress noted, mask in place; pt reports sore inside left nostril; here last night for same but left being seen

## 2018-12-01 NOTE — ED Notes (Signed)
Pt reports history of pinched nerve and back surgery. States was playing with niece tonight and had sharp pain go from back down R leg. States leg is tingling and numb. Also c/o sore in nose that makes him "sneeze" when he touches the outside of his nose. Dorsalis pedis pulse 2+ in R foot; leg appropriate warmth and color.

## 2018-12-14 DIAGNOSIS — Z79899 Other long term (current) drug therapy: Secondary | ICD-10-CM | POA: Diagnosis not present

## 2018-12-14 DIAGNOSIS — M5441 Lumbago with sciatica, right side: Secondary | ICD-10-CM | POA: Diagnosis not present

## 2018-12-14 DIAGNOSIS — M545 Low back pain: Secondary | ICD-10-CM | POA: Diagnosis present

## 2018-12-14 DIAGNOSIS — Z87891 Personal history of nicotine dependence: Secondary | ICD-10-CM | POA: Insufficient documentation

## 2018-12-14 NOTE — ED Triage Notes (Addendum)
Patient ambulatory to triage with steady gait, without difficulty or distress noted, mask in place; pt reports rt leg pain pain; has been seen multiple times for same; pt also st he would like his blood checked because "his doctor told him he had 2 hemorrhoids that were bleeding"

## 2018-12-15 ENCOUNTER — Other Ambulatory Visit: Payer: Self-pay

## 2018-12-15 ENCOUNTER — Emergency Department
Admission: EM | Admit: 2018-12-15 | Discharge: 2018-12-15 | Disposition: A | Discharge status: Home / Self Care | Payer: Medicaid Other | Attending: Emergency Medicine | Admitting: Emergency Medicine

## 2018-12-15 ENCOUNTER — Encounter: Payer: Self-pay | Admitting: Emergency Medicine

## 2018-12-15 DIAGNOSIS — G8929 Other chronic pain: Secondary | ICD-10-CM

## 2018-12-15 LAB — CBC WITH DIFFERENTIAL/PLATELET
Abs Immature Granulocytes: 0.05 10*3/uL (ref 0.00–0.07)
Basophils Absolute: 0.1 10*3/uL (ref 0.0–0.1)
Basophils Relative: 1 %
Eosinophils Absolute: 0.2 10*3/uL (ref 0.0–0.5)
Eosinophils Relative: 2 %
HCT: 43.4 % (ref 39.0–52.0)
Hemoglobin: 14 g/dL (ref 13.0–17.0)
Immature Granulocytes: 1 %
Lymphocytes Relative: 20 %
Lymphs Abs: 1.9 10*3/uL (ref 0.7–4.0)
MCH: 32 pg (ref 26.0–34.0)
MCHC: 32.3 g/dL (ref 30.0–36.0)
MCV: 99.3 fL (ref 80.0–100.0)
Monocytes Absolute: 0.9 10*3/uL (ref 0.1–1.0)
Monocytes Relative: 9 %
Neutro Abs: 6.7 10*3/uL (ref 1.7–7.7)
Neutrophils Relative %: 67 %
Platelets: 251 10*3/uL (ref 150–400)
RBC: 4.37 MIL/uL (ref 4.22–5.81)
RDW: 13.2 % (ref 11.5–15.5)
WBC: 9.8 10*3/uL (ref 4.0–10.5)
nRBC: 0 % (ref 0.0–0.2)

## 2018-12-15 LAB — COMPREHENSIVE METABOLIC PANEL
ALT: 27 U/L (ref 0–44)
AST: 27 U/L (ref 15–41)
Albumin: 3.8 g/dL (ref 3.5–5.0)
Alkaline Phosphatase: 90 U/L (ref 38–126)
Anion gap: 8 (ref 5–15)
BUN: 11 mg/dL (ref 6–20)
CO2: 28 mmol/L (ref 22–32)
Calcium: 9 mg/dL (ref 8.9–10.3)
Chloride: 104 mmol/L (ref 98–111)
Creatinine, Ser: 0.81 mg/dL (ref 0.61–1.24)
GFR calc Af Amer: >60 mL/min (ref >60)
GFR calc non Af Amer: >60 mL/min (ref >60)
Glucose, Bld: 107 mg/dL — ABNORMAL HIGH (ref 70–99)
Potassium: 3.9 mmol/L (ref 3.5–5.1)
Sodium: 140 mmol/L (ref 135–145)
Total Bilirubin: 0.7 mg/dL (ref 0.3–1.2)
Total Protein: 6.7 g/dL (ref 6.5–8.1)

## 2018-12-15 MED ORDER — KETOROLAC TROMETHAMINE 10 MG PO TABS
10.0000 mg | ORAL_TABLET | Freq: Once | ORAL | Status: AC
  Administered 2018-12-15: 10 mg via ORAL
  Filled 2018-12-15: qty 1

## 2018-12-15 NOTE — ED Notes (Signed)
MD at the bedside for pt evaluation  

## 2018-12-15 NOTE — Discharge Instructions (Addendum)
Follow-up with your doctors for your MRI.  Follow with the pain clinic.  Return to the ER for any other concerns.

## 2018-12-15 NOTE — ED Provider Notes (Signed)
[facility] Emergency Department Provider Note  ____________________________________________   First MD Initiated Contact with Patient 12/15/18 [phone]     (approximate)  I have reviewed the triage vital signs and the nursing notes.   HISTORY  Chief Complaint Leg Pain    HPI Shane [name] is a 58 y.o. male with narcotic dependence who was discharged from the pain management clinic who now presents with back pain and right leg pain.  [name] had multiple visits.  He has an MRI scheduled in 10 days.  He has had prior cervical spine surgery.  Denies any numbness in his rectum.  Patient states this is the same pain that he has had for many years.  He also says he has some tingling and decreased sensation in that right leg as well.  His pain is severe, constant, worse with ambulating, radiates down the back of his leg, nothing makes it better.  He still able to ambulate.  Denies any fevers.      Past Medical History:  Diagnosis Date  . Anemia   . Anxiety   . Drug-seeking behavior    hx of narcotics dependence, previously discharged from pain management clinic  . Encounter for blood transfusion 2005, 02/2014  . GERD (gastroesophageal reflux disease)   . Hemorrhoid   . Herniated cervical disc     Patient Active Problem List   Diagnosis Date Noted  . Antisocial personality disorder ([place])   . Malingering   . Hemorrhoid 05/09/2014  . Skin abnormality 05/09/2014    Past Surgical History:  Procedure Laterality Date  . CERVICAL FUSION    . COLONOSCOPY  2/16   Dr. Vira Agar  . FRACTURE SURGERY Left    fractured femur  . prolapsed rectum surgery  04/30/1995   [facility], trans abdominal mesh repair per patient report  . SEPTOPLASTY      Prior to Admission medications   Medication Sig Start Date End Date Taking? Authorizing Provider  cyclobenzaprine (FLEXERIL) 10 MG tablet Take 1 tablet by mouth 2 (two) times daily as needed. 09/04/16   [provider]  docusate sodium (COLACE) 100 MG capsule Take 250 mg by mouth 2 (two) times daily.     [provider]  Ferrous Sulfate (IRON) 325 (65 FE) MG TABS Take 1 tablet by mouth daily.    [provider]  fluticasone (FLONASE) 50 MCG/ACT nasal spray Place 1 spray into both nostrils daily.  04/20/14   [provider]  gabapentin (NEURONTIN) 100 MG capsule Take 1 capsule (100 mg total) by mouth 3 (three) times daily. 11/13/18 11/13/19  Rudene Re, MD  glycerin adult 2 g suppository Place 1 suppository rectally as needed for constipation. 09/25/17   Nena Polio, MD  hydrocortisone (ANUSOL-HC) 25 MG suppository Place 1 suppository (25 mg total) rectally 2 (two) times daily. 05/22/14   Robert Bellow, MD  hydrocortisone (PROCTOSOL HC) 2.5 % rectal cream Place 1 application rectally 2 (two) times daily. 02/27/17   Earleen Newport, MD  hydrocortisone 2.5 % cream Apply 1 application topically daily. 11/30/16   [provider]  hydrocortisone-pramoxine (PROCTOFOAM HC) rectal foam Place 1 applicator rectally 2 (two) times daily. 12/22/16   Hinda Kehr, MD  hydrOXYzine (ATARAX/VISTARIL) 10 MG tablet Take 1 tablet (10 mg total) by mouth 3 (three) times daily as needed for itching. 04/12/16   Schaevitz, Randall An, MD  ketorolac (TORADOL) 10 MG tablet Take 1 tablet (10 mg total) by mouth every  6 (six) hours as needed. 11/21/18   Nita Sickle, MD  meloxicam (MOBIC) 15 MG tablet Take 1 tablet (15 mg total) by mouth daily. 11/21/18   Cuthriell, Delorise Royals, PA-C  methocarbamol (ROBAXIN) 750 MG tablet Take 1 tablet (750 mg total) by mouth every 8 (eight) hours as needed for muscle spasms. 11/13/18   Don Perking, [place], MD  omeprazole (PRILOSEC) 20 MG capsule Take 20 mg by mouth daily.  04/16/14   [provider]  pantoprazole (PROTONIX) 40 MG tablet Take 40 mg by mouth daily. 11/30/16   [provider]  terbinafine (LAMISIL) 250 MG  tablet Take 250 mg by mouth daily. 08/11/16   [provider]    Allergies Codeine and Amoxicillin  No family history on file.  Social History Social History   Tobacco Use  . Smoking status: Former Smoker    Years: 20.00    Quit date: 01/05/2005    Years since quitting: 13.9  . Smokeless tobacco: Current User  Substance Use Topics  . Alcohol use: Not Currently    Alcohol/week: 0.0 standard drinks  . Drug use: No      Review of Systems Constitutional: No fever/chills Eyes: No visual changes. ENT: No sore throat. Cardiovascular: Denies chest pain. Respiratory: Denies shortness of breath. Gastrointestinal: No abdominal pain.  No nausea, no vomiting.  No diarrhea.  No constipation. Genitourinary: Negative for dysuria. Musculoskeletal: + back pain leg pain  Skin: Negative for rash. Neurological: Negative for headaches, focal weakness or numbness. All other ROS negative ____________________________________________   PHYSICAL EXAM:  VITAL SIGNS: ED Triage Vitals [12/15/18 0001]  Enc Vitals Group     BP 109/70     Pulse Rate 78     Resp 16     Temp 98.8 F (37.1 C)     Temp Source Oral     SpO2 100 %     Weight 161 lb (73 kg)     Height 5' 9.5" (1.765 m)     Head Circumference      Peak Flow      Pain Score      Pain Loc      Pain Edu?      Excl. in GC?     Constitutional: Alert and oriented. Well appearing and in no acute distress. Eyes: Conjunctivae are normal. EOMI. Head: Atraumatic. Nose: No congestion/rhinnorhea. Mouth/Throat: Mucous membranes are moist.   Neck: No stridor. Trachea Midline. FROM Cardiovascular: Normal rate, regular rhythm. Grossly normal heart sounds.  Good peripheral circulation. Respiratory: Normal respiratory effort.  No retractions. Lungs CTAB. Gastrointestinal: Soft and nontender. No distention. No abdominal bruits.  Musculoskeletal: No lower extremity tenderness nor edema.  No joint effusions.  Good distal pulse  bilaterally.  No obvious effusions or redness to the leg.  No swelling to the leg. Neurologic:  Normal speech and language. No gross focal neurologic deficits are appreciated.  Skin:  Skin is warm, dry and intact. No rash noted. Psychiatric: Mood and affect are normal. Speech and behavior are normal. Back: T and L-spine tenderness without any step-offs  ____________________________________________   LABS (all labs ordered are listed, but only abnormal results are displayed)  Labs Reviewed  COMPREHENSIVE METABOLIC PANEL - Abnormal; Notable for the following components:      Result Value   Glucose, Bld 107 (*)    All other components within normal limits  CBC WITH DIFFERENTIAL/PLATELET   ____________________________________________  INITIAL IMPRESSION / ASSESSMENT AND PLAN / ED COURSE  Evalee Mutton  was evaluated in Emergency Department on 12/15/2018 for the symptoms described in the history of present illness. He was evaluated in the context of the global COVID-19 pandemic, which necessitated consideration that the patient might be at risk for infection with the SARS-CoV-2 virus that causes COVID-19. Institutional protocols and algorithms that pertain to the evaluation of patients at risk for COVID-19 are in a state of rapid change based on information released by regulatory bodies including the CDC and federal and state organizations. These policies and algorithms were followed during the patient's care in the ED.    Patient is here very frequently for his back pain and leg pain.  He already has an outpatient MRI pending.  No evidence of cord compression at this time.  When I discussed why cannot give opioids to the patient he said  "you have to keep coming around because some of them will give you the stuff and some of them wont"  He is afebrile have low suspicion for epidural abscess.  He has good distal pulse so unlikely ischemic leg.  No leg swelling to suggest DVT. He had requested  some lab work to be done to evaluate him for his hemorrhoids but there is no evidence of anemia.  Patient offered IM Toradol but he says he would prefer the pill.  Patient given 1 dose and will follow up outpatient with pain clinic  I discussed the provisional nature of ED diagnosis, the treatment so far, the ongoing plan of care, follow up appointments and return precautions with the patient and any family or support people present. They expressed understanding and agreed with the plan, discharged home.    __________________________________   FINAL CLINICAL IMPRESSION(S) / ED DIAGNOSES   Final diagnoses:  Other chronic pain  Chronic midline low back pain with right-sided sciatica      MEDICATIONS GIVEN DURING THIS VISIT:  Medications  ketorolac (TORADOL) tablet 10 mg (has no administration in time range)     ED Discharge Orders    None       Note:  This document was prepared using Dragon voice recognition software and may include unintentional dictation errors.   Concha SeFunke, Oaklan Persons E, MD 12/15/18 [phone]

## 2018-12-16 DIAGNOSIS — Z5321 Procedure and treatment not carried out due to patient leaving prior to being seen by health care provider: Secondary | ICD-10-CM | POA: Diagnosis not present

## 2018-12-16 DIAGNOSIS — M546 Pain in thoracic spine: Secondary | ICD-10-CM | POA: Diagnosis present

## 2018-12-17 ENCOUNTER — Encounter: Payer: Self-pay | Admitting: Emergency Medicine

## 2018-12-17 ENCOUNTER — Emergency Department
Admission: EM | Admit: 2018-12-17 | Discharge: 2018-12-17 | Disposition: A | Discharge status: Home / Self Care | Payer: Medicaid Other | Attending: Emergency Medicine | Admitting: Emergency Medicine

## 2018-12-17 ENCOUNTER — Other Ambulatory Visit: Payer: Self-pay

## 2018-12-17 DIAGNOSIS — Z79899 Other long term (current) drug therapy: Secondary | ICD-10-CM | POA: Insufficient documentation

## 2018-12-17 DIAGNOSIS — G8929 Other chronic pain: Secondary | ICD-10-CM | POA: Insufficient documentation

## 2018-12-17 DIAGNOSIS — Z791 Long term (current) use of non-steroidal anti-inflammatories (NSAID): Secondary | ICD-10-CM | POA: Insufficient documentation

## 2018-12-17 DIAGNOSIS — M5441 Lumbago with sciatica, right side: Secondary | ICD-10-CM | POA: Insufficient documentation

## 2018-12-17 DIAGNOSIS — F17220 Nicotine dependence, chewing tobacco, uncomplicated: Secondary | ICD-10-CM | POA: Insufficient documentation

## 2018-12-17 NOTE — ED Triage Notes (Signed)
Pt ambulatory to triage with slow steady gait. No difficulty or distress noted. Pt reports around 10 pm he was getting up off the bed and felt qa pop in the thoracic region of his spine and is still having 10/10 pain.

## 2018-12-17 NOTE — ED Notes (Signed)
No answer when called for room 

## 2018-12-17 NOTE — ED Triage Notes (Signed)
Pt here with right leg pain, states arthritis in spine causing the intermittent pain, walked to to triage with no difficulty noted, 10/10 pain. States he has an MRI of the thoracic lumbar region at [facility] next week.

## 2018-12-17 NOTE — ED Notes (Signed)
Pt called to take to a room for treatment but stated he wanted to stay in the lobby to charge his phone for a while longer and to give his bed to the next patient.

## 2018-12-17 NOTE — ED Notes (Signed)
No answer when called for a room. 

## 2018-12-18 ENCOUNTER — Other Ambulatory Visit: Payer: Self-pay

## 2018-12-18 ENCOUNTER — Encounter: Payer: Self-pay | Admitting: Emergency Medicine

## 2018-12-18 ENCOUNTER — Emergency Department
Admission: EM | Admit: 2018-12-18 | Discharge: 2018-12-18 | Disposition: A | Discharge status: Home / Self Care | Payer: Medicaid Other | Source: Home / Self Care | Attending: Emergency Medicine | Admitting: Emergency Medicine

## 2018-12-18 DIAGNOSIS — M79604 Pain in right leg: Secondary | ICD-10-CM | POA: Insufficient documentation

## 2018-12-18 DIAGNOSIS — G8929 Other chronic pain: Secondary | ICD-10-CM

## 2018-12-18 DIAGNOSIS — Z5321 Procedure and treatment not carried out due to patient leaving prior to being seen by health care provider: Secondary | ICD-10-CM | POA: Diagnosis not present

## 2018-12-18 MED ORDER — KETOROLAC TROMETHAMINE 10 MG PO TABS: 10.0000 mg | ORAL_TABLET | Freq: Once | ORAL | Status: DC

## 2018-12-18 MED ORDER — KETOROLAC TROMETHAMINE 10 MG PO TABS
10.0000 mg | ORAL_TABLET | Freq: Once | ORAL | Status: AC
  Administered 2018-12-18: 05:00:00 10 mg via ORAL

## 2018-12-18 NOTE — ED Triage Notes (Signed)
Patient with complaint of right leg pain.

## 2018-12-18 NOTE — ED Notes (Signed)
Pt states "I have not taken anything for my pain tonight. The last thing I took was tylenol at about three o'clock yesterday afternoon."

## 2018-12-18 NOTE — ED Notes (Signed)
Pt states he does not want toradol, pt reports "tramadol or vicodin are what usually work for me. Toradol doesn't help me, I couldn't even tell that I had taken it."

## 2018-12-18 NOTE — ED Provider Notes (Signed)
[facility] Emergency Department Provider Note   First MD Initiated Contact with Patient 12/18/18 [phone]     (approximate)  I have reviewed the triage vital signs and the nursing notes.   HISTORY  Chief Complaint Leg Pain    HPI Shane [name] is a 58 y.o. male with below list of previous medical conditions presents to the emergency department secondary to low back pain with radiation down the posterior right leg.  Patient states that current pain score is 10 out of 10.  Patient denies any change in urinary or bowel habits patient has been seen in the emergency department multiple times secondary to the same as this is his 28th visit since September 25.  Patient states that he has an MRI scheduled at [facility] next week and as such refuses an MRI tonight.        Past Medical History:  Diagnosis Date  . Anemia   . Anxiety   . Drug-seeking behavior    hx of narcotics dependence, previously discharged from pain management clinic  . Encounter for blood transfusion 2005, 02/2014  . GERD (gastroesophageal reflux disease)   . Hemorrhoid   . Herniated cervical disc     Patient Active Problem List   Diagnosis Date Noted  . Antisocial personality disorder (HCC)   . Malingering   . Hemorrhoid 05/09/2014  . Skin abnormality 05/09/2014    Past Surgical History:  Procedure Laterality Date  . CERVICAL FUSION    . COLONOSCOPY  2/16   Dr. Mechele Collin  . FRACTURE SURGERY Left    fractured femur  . prolapsed rectum surgery  04/30/1995   [facility], trans abdominal mesh repair per patient report  . SEPTOPLASTY      Prior to Admission medications   Medication Sig Start Date End Date Taking? Authorizing Provider  cyclobenzaprine (FLEXERIL) 10 MG tablet Take 1 tablet by mouth 2 (two) times daily as needed. 09/04/16   [provider]  docusate sodium (COLACE) 100 MG capsule Take 250 mg by mouth 2 (two) times daily.     [provider]  Ferrous  Sulfate (IRON) 325 (65 FE) MG TABS Take 1 tablet by mouth daily.    [provider]  fluticasone (FLONASE) 50 MCG/ACT nasal spray Place 1 spray into both nostrils daily.  04/20/14   [provider]  gabapentin (NEURONTIN) 100 MG capsule Take 1 capsule (100 mg total) by mouth 3 (three) times daily. 11/13/18 11/13/19  Nita Sickle, MD  glycerin adult 2 g suppository Place 1 suppository rectally as needed for constipation. 09/25/17   Arnaldo Natal, MD  hydrocortisone (ANUSOL-HC) 25 MG suppository Place 1 suppository (25 mg total) rectally 2 (two) times daily. 05/22/14   Earline Mayotte, MD  hydrocortisone (PROCTOSOL HC) 2.5 % rectal cream Place 1 application rectally 2 (two) times daily. 02/27/17   Emily Filbert, MD  hydrocortisone 2.5 % cream Apply 1 application topically daily. 11/30/16   [provider]  hydrocortisone-pramoxine (PROCTOFOAM HC) rectal foam Place 1 applicator rectally 2 (two) times daily. 12/22/16   Loleta Rose, MD  hydrOXYzine (ATARAX/VISTARIL) 10 MG tablet Take 1 tablet (10 mg total) by mouth 3 (three) times daily as needed for itching. 04/12/16   Schaevitz, Myra Rude, MD  ketorolac (TORADOL) 10 MG tablet Take 1 tablet (10 mg total) by mouth every 6 (six) hours as needed. 11/21/18   Nita Sickle, MD  meloxicam (MOBIC) 15 MG tablet Take 1 tablet (15 mg total)  by mouth daily. 11/21/18   Cuthriell, Charline Bills, PA-C  methocarbamol (ROBAXIN) 750 MG tablet Take 1 tablet (750 mg total) by mouth every 8 (eight) hours as needed for muscle spasms. 11/13/18   Alfred Levins, [place], MD  omeprazole (PRILOSEC) 20 MG capsule Take 20 mg by mouth daily.  04/16/14   [provider]  pantoprazole (PROTONIX) 40 MG tablet Take 40 mg by mouth daily. 11/30/16   [provider]  terbinafine (LAMISIL) 250 MG tablet Take 250 mg by mouth daily. 08/11/16   [provider]    Allergies Codeine and Amoxicillin  No family history on  file.  Social History Social History   Tobacco Use  . Smoking status: Former Smoker    Years: 20.00    Quit date: 01/05/2005    Years since quitting: 13.9  . Smokeless tobacco: Current User  Substance Use Topics  . Alcohol use: Not Currently    Alcohol/week: 0.0 standard drinks  . Drug use: No    Review of Systems Constitutional: No fever/chills Eyes: No visual changes. ENT: No sore throat. Cardiovascular: Denies chest pain. Respiratory: Denies shortness of breath. Gastrointestinal: No abdominal pain.  No nausea, no vomiting.  No diarrhea.  No constipation. Genitourinary: Negative for dysuria. Musculoskeletal: Negative for neck pain.  Positive for back pain. Integumentary: Negative for rash. Neurological: Negative for headaches, focal weakness or numbness.   ____________________________________________   PHYSICAL EXAM:  VITAL SIGNS: ED Triage Vitals  Enc Vitals Group     BP 12/17/18 2348 130/89     Pulse Rate 12/17/18 2348 86     Resp 12/17/18 2348 16     Temp 12/17/18 2348 98.4 F (36.9 C)     Temp Source 12/17/18 2348 Oral     SpO2 12/17/18 2348 100 %     Weight --      Height --      Head Circumference --      Peak Flow --      Pain Score 12/17/18 2353 10     Pain Loc --      Pain Edu? --      Excl. in [place]? --     Constitutional: Alert and oriented.  Eyes: Conjunctivae are normal.  Mouth/Throat: Patient is wearing a mask. Neck: No stridor.    Musculoskeletal: No lower extremity tenderness nor edema. No gross deformities of extremities. Neurologic:  Normal speech and language. No gross focal neurologic deficits are appreciated.  Skin:  Skin is warm, dry and intact. Psychiatric: Mood and affect are normal. Speech and behavior are normal.    Procedures   ____________________________________________   INITIAL IMPRESSION / MDM / ASSESSMENT AND PLAN / ED COURSE  As part of my medical decision making, I reviewed the following data within the  electronic MEDICAL RECORD NUMBER 58 year old male presented with above-stated history and physical exam secondary to low back pain.  Patient with chronic back pain has been seen in the emergency department multiple times secondary to the same.  Patient is requesting Vicodin.  I informed the patient that I would not give him Vicodin or any other narcotic prescription medication.  Patient was offered Neurontin or Toradol.  The patient agreed to take Toradol.  ____________________________________________  FINAL CLINICAL IMPRESSION(S) / ED DIAGNOSES  Final diagnoses:  Chronic right-sided low back pain with right-sided sciatica     MEDICATIONS GIVEN DURING THIS VISIT:  Medications  ketorolac (TORADOL) tablet 10 mg (10 mg Oral Given 12/18/18 0447)     ED  Discharge Orders    None      *Please note:  Shane [name] was evaluated in Emergency Department on 12/18/2018 for the symptoms described in the history of present illness. He was evaluated in the context of the global COVID-19 pandemic, which necessitated consideration that the patient might be at risk for infection with the SARS-CoV-2 virus that causes COVID-19. Institutional protocols and algorithms that pertain to the evaluation of patients at risk for COVID-19 are in a state of rapid change based on information released by regulatory bodies including the CDC and federal and state organizations. These policies and algorithms were followed during the patient's care in the ED.  Some ED evaluations and interventions may be delayed as a result of limited staffing during the pandemic.*  Note:  This document was prepared using Dragon voice recognition software and may include unintentional dictation errors.   Darci CurrentBrown, [place], MD 12/18/18 [id number]

## 2018-12-19 ENCOUNTER — Encounter: Payer: Self-pay | Admitting: Emergency Medicine

## 2018-12-19 ENCOUNTER — Other Ambulatory Visit: Payer: Self-pay

## 2018-12-19 ENCOUNTER — Emergency Department
Admission: EM | Admit: 2018-12-19 | Discharge: 2018-12-19 | Disposition: A | Discharge status: Home / Self Care | Payer: Medicaid Other | Attending: Emergency Medicine | Admitting: Emergency Medicine

## 2018-12-19 DIAGNOSIS — Z5321 Procedure and treatment not carried out due to patient leaving prior to being seen by health care provider: Secondary | ICD-10-CM | POA: Insufficient documentation

## 2018-12-19 DIAGNOSIS — M549 Dorsalgia, unspecified: Secondary | ICD-10-CM | POA: Insufficient documentation

## 2018-12-19 NOTE — ED Triage Notes (Signed)
Pt presents to Er with middle back pain reports pain for 5 years today pain has increased, pt reports he only takes Tylenol at home, pt playing on his cellular device no distress noted. Pt talks in complete sentences

## 2018-12-20 ENCOUNTER — Emergency Department
Admission: EM | Admit: 2018-12-20 | Discharge: 2018-12-20 | Disposition: A | Discharge status: Home / Self Care | Payer: Medicaid Other | Source: Home / Self Care

## 2018-12-20 ENCOUNTER — Other Ambulatory Visit: Payer: Self-pay

## 2018-12-20 ENCOUNTER — Encounter: Payer: Self-pay | Admitting: *Deleted

## 2018-12-20 DIAGNOSIS — Z20828 Contact with and (suspected) exposure to other viral communicable diseases: Secondary | ICD-10-CM | POA: Insufficient documentation

## 2018-12-20 DIAGNOSIS — Z87891 Personal history of nicotine dependence: Secondary | ICD-10-CM | POA: Diagnosis not present

## 2018-12-20 DIAGNOSIS — J029 Acute pharyngitis, unspecified: Secondary | ICD-10-CM | POA: Insufficient documentation

## 2018-12-20 DIAGNOSIS — R6883 Chills (without fever): Secondary | ICD-10-CM | POA: Insufficient documentation

## 2018-12-20 DIAGNOSIS — Z79899 Other long term (current) drug therapy: Secondary | ICD-10-CM | POA: Diagnosis not present

## 2018-12-20 NOTE — ED Notes (Signed)
Call for room multiple times., pt not visualized in ED.  Pt observed walking outside around 0430, but did not inform staff he was leaving.

## 2018-12-20 NOTE — ED Triage Notes (Signed)
Pt ambulatory to triage.   Pt has sore throat and chills.  Sx began today. Pt alert.

## 2018-12-20 NOTE — ED Notes (Signed)
Pt up to desk inquiring about wait times, pt ambulatory without difficulty, pt states he was supposed to next after another patient was taken back.  Explained how the triage process works, pt asked if his BP would be checked again.   Pt has blanket, informed him that I would notify him when he was due to see the physician.

## 2018-12-21 ENCOUNTER — Emergency Department
Admission: EM | Admit: 2018-12-21 | Discharge: 2018-12-21 | Disposition: A | Discharge status: Home / Self Care | Payer: Medicaid Other | Source: Home / Self Care | Attending: Emergency Medicine | Admitting: Emergency Medicine

## 2018-12-21 ENCOUNTER — Other Ambulatory Visit: Payer: Self-pay

## 2018-12-21 ENCOUNTER — Emergency Department
Admission: EM | Admit: 2018-12-21 | Discharge: 2018-12-21 | Disposition: A | Discharge status: Home / Self Care | Payer: Medicaid Other | Attending: Emergency Medicine | Admitting: Emergency Medicine

## 2018-12-21 DIAGNOSIS — J029 Acute pharyngitis, unspecified: Secondary | ICD-10-CM

## 2018-12-21 DIAGNOSIS — Z79899 Other long term (current) drug therapy: Secondary | ICD-10-CM | POA: Insufficient documentation

## 2018-12-21 DIAGNOSIS — Z87891 Personal history of nicotine dependence: Secondary | ICD-10-CM | POA: Insufficient documentation

## 2018-12-21 LAB — SARS CORONAVIRUS 2 (TAT 6-24 HRS): SARS Coronavirus 2: NEGATIVE

## 2018-12-21 LAB — GROUP A STREP BY PCR: Group A Strep by PCR: NOT DETECTED

## 2018-12-21 MED ORDER — LIDOCAINE VISCOUS HCL 2 % MT SOLN
15.0000 mL | Freq: Once | OROMUCOSAL | Status: AC
  Administered 2018-12-21: 15 mL via OROMUCOSAL
  Filled 2018-12-21: qty 15

## 2018-12-21 MED ORDER — IBUPROFEN 600 MG PO TABS
600.0000 mg | ORAL_TABLET | Freq: Once | ORAL | Status: AC
  Administered 2018-12-21: 400 mg via ORAL

## 2018-12-21 MED ORDER — IBUPROFEN 600 MG PO TABS
600.0000 mg | ORAL_TABLET | Freq: Once | ORAL | Status: AC
  Administered 2018-12-21: 600 mg via ORAL
  Filled 2018-12-21: qty 1

## 2018-12-21 MED ORDER — MAGIC MOUTHWASH W/LIDOCAINE: 5.0000 mL | Freq: Four times a day (QID) | ORAL | 0 refills | Status: AC

## 2018-12-21 NOTE — ED Notes (Signed)
Patient raised fist up like he was going to hit this Probation officer after doing COVID swab.

## 2018-12-21 NOTE — ED Triage Notes (Signed)
Pt back to the er for sore throat. Pt was d/c earlier today for the same. Pt thinks gargling with salt water with make his BP increase. Pt was tested for covid last night and is negative.

## 2018-12-21 NOTE — ED Provider Notes (Signed)
[facility] Emergency Department Provider Note  ____________________________________________  Time seen: Approximately 10:34 PM  I have reviewed the triage vital signs and the nursing notes.   HISTORY  Chief Complaint Sore Throat    HPI Shane [name] is a 58 y.o. male who presents the emergency department for evaluation of ongoing sore throat.  Patient was seen last night for same complaint with negative strep test and negative Covid swab.  Patient continues to complain of sore throat and is requesting pain medication.  Patient has been seen in this department 32 times in the last 2 months.  Patient has a strong history of drug-seeking behavior, and routinely presents with pain complaints.  Patient is upset tonight because he states that we tested him for SARS-CoV-2 instead of "COVID-19."  Patient states that he knows that the testing was negative but does not believe that we tested him for COVID-19.  Patient is currently requesting pain medication for his throat.  Patient denies any difficulty breathing or swallowing.  Patient continues to complain of back pain and leg pain which is a chronic complaint for him but denies any changes with this complaint.       Past Medical History:  Diagnosis Date  . Anemia   . Anxiety   . Drug-seeking behavior    hx of narcotics dependence, previously discharged from pain management clinic  . Encounter for blood transfusion 2005, 02/2014  . GERD (gastroesophageal reflux disease)   . Hemorrhoid   . Herniated cervical disc     Patient Active Problem List   Diagnosis Date Noted  . Antisocial personality disorder (HCC)   . Malingering   . Hemorrhoid 05/09/2014  . Skin abnormality 05/09/2014    Past Surgical History:  Procedure Laterality Date  . CERVICAL FUSION    . COLONOSCOPY  2/16   Dr. Mechele Collin  . FRACTURE SURGERY Left    fractured femur  . prolapsed rectum surgery  04/30/1995   [facility], trans abdominal  mesh repair per patient report  . SEPTOPLASTY      Prior to Admission medications   Medication Sig Start Date End Date Taking? Authorizing Provider  cyclobenzaprine (FLEXERIL) 10 MG tablet Take 1 tablet by mouth 2 (two) times daily as needed. 09/04/16   [provider]  docusate sodium (COLACE) 100 MG capsule Take 250 mg by mouth 2 (two) times daily.     [provider]  Ferrous Sulfate (IRON) 325 (65 FE) MG TABS Take 1 tablet by mouth daily.    [provider]  fluticasone (FLONASE) 50 MCG/ACT nasal spray Place 1 spray into both nostrils daily.  04/20/14   [provider]  gabapentin (NEURONTIN) 100 MG capsule Take 1 capsule (100 mg total) by mouth 3 (three) times daily. 11/13/18 11/13/19  Nita Sickle, MD  glycerin adult 2 g suppository Place 1 suppository rectally as needed for constipation. 09/25/17   Arnaldo Natal, MD  hydrocortisone (ANUSOL-HC) 25 MG suppository Place 1 suppository (25 mg total) rectally 2 (two) times daily. 05/22/14   Earline Mayotte, MD  hydrocortisone (PROCTOSOL HC) 2.5 % rectal cream Place 1 application rectally 2 (two) times daily. 02/27/17   Emily Filbert, MD  hydrocortisone 2.5 % cream Apply 1 application topically daily. 11/30/16   [provider]  hydrocortisone-pramoxine (PROCTOFOAM HC) rectal foam Place 1 applicator rectally 2 (two) times daily. 12/22/16   Loleta Rose, MD  hydrOXYzine (ATARAX/VISTARIL) 10 MG tablet Take 1 tablet (10 mg total) by  mouth 3 (three) times daily as needed for itching. 04/12/16   Schaevitz, Myra Rudeavid Matthew, MD  ketorolac (TORADOL) 10 MG tablet Take 1 tablet (10 mg total) by mouth every 6 (six) hours as needed. 11/21/18   Nita SickleVeronese, [place], MD  magic mouthwash w/lidocaine SOLN Take 5 mLs by mouth 4 (four) times daily. 12/21/18   Aiza Vollrath, Delorise RoyalsJonathan D, PA-C  meloxicam (MOBIC) 15 MG tablet Take 1 tablet (15 mg total) by mouth daily. 11/21/18   Teryl Gubler, Delorise RoyalsJonathan D, PA-C  methocarbamol  (ROBAXIN) 750 MG tablet Take 1 tablet (750 mg total) by mouth every 8 (eight) hours as needed for muscle spasms. 11/13/18   Don PerkingVeronese, [place], MD  omeprazole (PRILOSEC) 20 MG capsule Take 20 mg by mouth daily.  04/16/14   [provider]  pantoprazole (PROTONIX) 40 MG tablet Take 40 mg by mouth daily. 11/30/16   [provider]  terbinafine (LAMISIL) 250 MG tablet Take 250 mg by mouth daily. 08/11/16   [provider]    Allergies Codeine and Amoxicillin  No family history on file.  Social History Social History   Tobacco Use  . Smoking status: Former Smoker    Years: 20.00    Quit date: 01/05/2005    Years since quitting: 13.9  . Smokeless tobacco: Current User  Substance Use Topics  . Alcohol use: Not Currently    Alcohol/week: 0.0 standard drinks  . Drug use: No     Review of Systems  Constitutional: No fever/chills Eyes: No visual changes. No discharge ENT: Positive for sore throat Cardiovascular: no chest pain. Respiratory: no cough. No SOB. Gastrointestinal: No abdominal pain.  No nausea, no vomiting.  No diarrhea.  No constipation. Genitourinary: Negative for dysuria. No hematuria Musculoskeletal: Negative for musculoskeletal pain. Skin: Negative for rash, abrasions, lacerations, ecchymosis. Neurological: Negative for headaches, focal weakness or numbness. 10-point ROS otherwise negative.  ____________________________________________   PHYSICAL EXAM:  VITAL SIGNS: ED Triage Vitals  Enc Vitals Group     BP 12/21/18 2229 140/90     Pulse Rate 12/21/18 2229 (!) 102     Resp 12/21/18 2229 18     Temp 12/21/18 2229 98.7 F (37.1 C)     Temp Source 12/21/18 2229 Oral     SpO2 12/21/18 2229 99 %     Weight 12/21/18 2227 161 lb 6 oz (73.2 kg)     Height 12/21/18 2227 5\' 9"  (1.753 m)     Head Circumference --      Peak Flow --      Pain Score 12/21/18 2227 10     Pain Loc --      Pain Edu? --      Excl. in [place]? --       Constitutional: Alert and oriented. Well appearing and in no acute distress. Eyes: Conjunctivae are normal. PERRL. EOMI. Head: Atraumatic. ENT:      Ears:       Nose: No congestion/rhinnorhea.      Mouth/Throat: Mucous membranes are moist.  Oropharynx is minimally erythematous but not edematous.  No exudates.  Uvula is midline.  No edema or erythema of the neck or submental region.  Sublingual space is soft to palpation.  No palpable abnormality about the anterior neck. Neck: No stridor.  Neck is supple full range of motion Hematological/Lymphatic/Immunilogical: No cervical lymphadenopathy. Cardiovascular: Normal rate, regular rhythm. Normal S1 and S2.  Good peripheral circulation. Respiratory: Normal respiratory effort without tachypnea or retractions. Lungs CTAB. Good air entry to the bases  with no decreased or absent breath sounds. Musculoskeletal: Full range of motion to all extremities. No gross deformities appreciated. Neurologic:  Normal speech and language. No gross focal neurologic deficits are appreciated.  Skin:  Skin is warm, dry and intact. No rash noted. Psychiatric: Mood and affect are normal. Speech and behavior are normal. Patient exhibits appropriate insight and judgement.   ____________________________________________   LABS (all labs ordered are listed, but only abnormal results are displayed)  Labs Reviewed - No data to display ____________________________________________  EKG   ____________________________________________  RADIOLOGY   No results found.  ____________________________________________    PROCEDURES  Procedure(s) performed:    Procedures    Medications  ibuprofen (ADVIL) tablet 600 mg (has no administration in time range)     ____________________________________________   INITIAL IMPRESSION / ASSESSMENT AND PLAN / ED COURSE  Pertinent labs & imaging results that were available during my care of the patient were  reviewed by me and considered in my medical decision making (see chart for details).  Review of the [place] CSRS was performed in accordance of the [place] prior to dispensing any controlled drugs.           [name] diagnosis is consistent with viral pharyngitis.  Patient presents the emergency department for the second night in a row for complaint of sore throat.  Patient has a significant history of presenting to the emergency department for pain complaints.  Patient has been seen in this department 32 times in the last 2 months alone.  Patient had negative strep test and negative Covid swab.  Patient was upset because he believes we did the wrong test in regards to COVID-19.  Patient was informed that the test was correct and that he was negative for COVID-19.  Patient continues to complain of sore throat and is requesting pain medication.  I advised him that I would prescribe a oral liquid that he may gargle and spit out which contains an analgesic.  Patient is agreeable at this time for discharge..  No labs or imaging deemed necessary at this time.  Patient is given ED precautions to return to the ED for any worsening or new symptoms.     ____________________________________________  FINAL CLINICAL IMPRESSION(S) / ED DIAGNOSES  Final diagnoses:  Viral pharyngitis      NEW MEDICATIONS STARTED DURING THIS VISIT:  ED Discharge Orders         Ordered    magic mouthwash w/lidocaine SOLN  4 times daily    Note to Pharmacy: Dispense in a 1/1/1 ratio. Use lidocaine, diphenhydramine, prednisolone   12/21/18 2234              This chart was dictated using voice recognition software/Dragon. Despite best efforts to proofread, errors can occur which can change the meaning. Any change was purely unintentional.    Darletta Moll, PA-C 12/21/18 2242    Carrie Mew, MD 12/21/18 [phone]

## 2018-12-21 NOTE — ED Notes (Signed)
Patient states having a sore throat for the past couple of day that has got worse, along with cold chills. Patient denies knowing if he has been running a fever due to not having a thermometer.

## 2018-12-21 NOTE — ED Notes (Signed)
Pt seen in triage by jonathan pac in triage.  Pt discharged from triage.

## 2018-12-21 NOTE — ED Provider Notes (Signed)
[facility] Emergency Department Provider Note  ____________________________________________  Time seen: Approximately 2:15 AM  I have reviewed the triage vital signs and the nursing notes.   HISTORY  Chief Complaint Sore Throat   HPI Shane [name] is a 58 y.o. male history of drug-seeking behavior, GERD, anemia and anxiety who presents for evaluation of sore throat and cold chills.  Symptoms have been ongoing for 2 days.  He reports sharp pain every time he swallows located in his upper throat, otherwise he is able to swallow with no difficulty, no hoarseness, no fever, no cough, no chest pain, no shortness of breath, no abdominal pain, no vomiting, no diarrhea.  Patient denies any known exposures to Covid however he literally comes into our emergency department almost every other day for different pain complaints with 13 visits over the last 30 days therefore he must have had several close Covid exposures.   Past Medical History:  Diagnosis Date  . Anemia   . Anxiety   . Drug-seeking behavior    hx of narcotics dependence, previously discharged from pain management clinic  . Encounter for blood transfusion 2005, 02/2014  . GERD (gastroesophageal reflux disease)   . Hemorrhoid   . Herniated cervical disc     Patient Active Problem List   Diagnosis Date Noted  . Antisocial personality disorder ([place])   . Malingering   . Hemorrhoid 05/09/2014  . Skin abnormality 05/09/2014    Past Surgical History:  Procedure Laterality Date  . CERVICAL FUSION    . COLONOSCOPY  2/16   Dr. Vira Agar  . FRACTURE SURGERY Left    fractured femur  . prolapsed rectum surgery  04/30/1995   [facility], trans abdominal mesh repair per patient report  . SEPTOPLASTY      Prior to Admission medications   Medication Sig Start Date End Date Taking? Authorizing Provider  cyclobenzaprine (FLEXERIL) 10 MG tablet Take 1 tablet by mouth 2 (two) times daily as needed. 09/04/16    [provider]  docusate sodium (COLACE) 100 MG capsule Take 250 mg by mouth 2 (two) times daily.     [provider]  Ferrous Sulfate (IRON) 325 (65 FE) MG TABS Take 1 tablet by mouth daily.    [provider]  fluticasone (FLONASE) 50 MCG/ACT nasal spray Place 1 spray into both nostrils daily.  04/20/14   [provider]  gabapentin (NEURONTIN) 100 MG capsule Take 1 capsule (100 mg total) by mouth 3 (three) times daily. 11/13/18 11/13/19  Rudene Re, MD  glycerin adult 2 g suppository Place 1 suppository rectally as needed for constipation. 09/25/17   Nena Polio, MD  hydrocortisone (ANUSOL-HC) 25 MG suppository Place 1 suppository (25 mg total) rectally 2 (two) times daily. 05/22/14   Robert Bellow, MD  hydrocortisone (PROCTOSOL HC) 2.5 % rectal cream Place 1 application rectally 2 (two) times daily. 02/27/17   Earleen Newport, MD  hydrocortisone 2.5 % cream Apply 1 application topically daily. 11/30/16   [provider]  hydrocortisone-pramoxine (PROCTOFOAM HC) rectal foam Place 1 applicator rectally 2 (two) times daily. 12/22/16   Hinda Kehr, MD  hydrOXYzine (ATARAX/VISTARIL) 10 MG tablet Take 1 tablet (10 mg total) by mouth 3 (three) times daily as needed for itching. 04/12/16   Schaevitz, Randall An, MD  ketorolac (TORADOL) 10 MG tablet Take 1 tablet (10 mg total) by mouth every 6 (six) hours as needed. 11/21/18   Rudene Re, MD  meloxicam [facility]) 15  MG tablet Take 1 tablet (15 mg total) by mouth daily. 11/21/18   Cuthriell, Delorise RoyalsJonathan D, PA-C  methocarbamol (ROBAXIN) 750 MG tablet Take 1 tablet (750 mg total) by mouth every 8 (eight) hours as needed for muscle spasms. 11/13/18   Don PerkingVeronese, [place], MD  omeprazole (PRILOSEC) 20 MG capsule Take 20 mg by mouth daily.  04/16/14   [provider]  pantoprazole (PROTONIX) 40 MG tablet Take 40 mg by mouth daily. 11/30/16   [provider]  terbinafine (LAMISIL)  250 MG tablet Take 250 mg by mouth daily. 08/11/16   [provider]    Allergies Codeine and Amoxicillin  No family history on file.  Social History Social History   Tobacco Use  . Smoking status: Former Smoker    Years: 20.00    Quit date: 01/05/2005    Years since quitting: 13.9  . Smokeless tobacco: Current User  Substance Use Topics  . Alcohol use: Not Currently    Alcohol/week: 0.0 standard drinks  . Drug use: No    Review of Systems  Constitutional: Negative for fever. + chills Eyes: Negative for visual changes. ENT: + sore throat. Neck: No neck pain  Cardiovascular: Negative for chest pain. Respiratory: Negative for shortness of breath. Gastrointestinal: Negative for abdominal pain, vomiting or diarrhea. Genitourinary: Negative for dysuria. Musculoskeletal: Negative for back pain. Skin: Negative for rash. Neurological: Negative for headaches, weakness or numbness. Psych: No SI or HI  ____________________________________________   PHYSICAL EXAM:  VITAL SIGNS: ED Triage Vitals  Enc Vitals Group     BP 12/20/18 2255 (!) 141/94     Pulse Rate 12/20/18 2255 95     Resp 12/20/18 2255 18     Temp 12/20/18 2255 98.2 F (36.8 C)     Temp Source 12/20/18 2255 Oral     SpO2 12/20/18 2255 99 %     Weight 12/20/18 2256 161 lb (73 kg)     Height 12/20/18 2256 5\' 9"  (1.753 m)     Head Circumference --      Peak Flow --      Pain Score 12/20/18 2256 10     Pain Loc --      Pain Edu? --      Excl. in GC? --     Constitutional: Alert and oriented. Well appearing and in no apparent distress. HEENT:      Head: Normocephalic and atraumatic.         Eyes: Conjunctivae are normal. Sclera is non-icteric.       Mouth/Throat: Mucous membranes are moist.  Oropharynx is clear with no peritonsillar abscess, no exudate, no erythema, no stridor, patient is breathing and swallowing with no difficulty, no hoarseness, floor of the mouth is soft      Neck: Supple with no  signs of meningismus.  No induration, no redness, no tenderness Cardiovascular: Regular rate and rhythm.  Respiratory: Normal respiratory effort.  Musculoskeletal: Nontender with normal range of motion in all extremities. No edema, cyanosis, or erythema of extremities. Neurologic: Normal speech and language. Face is symmetric. Moving all extremities. No gross focal neurologic deficits are appreciated. Skin: Skin is warm, dry and intact. No rash noted. Psychiatric: Mood and affect are normal. Speech and behavior are normal.  ____________________________________________   LABS (all labs ordered are listed, but only abnormal results are displayed)  Labs Reviewed  GROUP A STREP BY PCR  SARS CORONAVIRUS 2 (TAT 6-24 HRS)   ____________________________________________  EKG  none  ____________________________________________  RADIOLOGY  none  ____________________________________________   PROCEDURES  Procedure(s) performed: None Procedures Critical Care performed:  None ____________________________________________   INITIAL IMPRESSION / ASSESSMENT AND PLAN / ED COURSE   58 y.o. male history of drug-seeking behavior, GERD, anemia and anxiety who presents for evaluation of sore throat and cold chills x 2 days.  Patient is extremely well-appearing in no distress with normal vitals, oropharynx is clear with no evidence of peritonsillar abscess, Ludwig's angina, pharyngitis.  This is patient's 13th visit to the emergency room just in the last 30 days therefore he must have had several recent Covid exposures.  Will test for Covid.  Discussed quarantine with patient and my return precautions.  Strep negative.  Will give ibuprofen and viscous lidocaine for pain.       As part of my medical decision making, I reviewed the following data within the electronic MEDICAL RECORD NUMBER Nursing notes reviewed and incorporated, Labs reviewed , Old chart reviewed, Notes from prior ED visits and [place]  Controlled Substance Database   Please note:  Patient was evaluated in Emergency Department today for the symptoms described in the history of present illness. Patient was evaluated in the context of the global COVID-19 pandemic, which necessitated consideration that the patient might be at risk for infection with the SARS-CoV-2 virus that causes COVID-19. Institutional protocols and algorithms that pertain to the evaluation of patients at risk for COVID-19 are in a state of rapid change based on information released by regulatory bodies including the CDC and federal and state organizations. These policies and algorithms were followed during the patient's care in the ED.  Some ED evaluations and interventions may be delayed as a result of limited staffing during the pandemic.   ____________________________________________   FINAL CLINICAL IMPRESSION(S) / ED DIAGNOSES   Final diagnoses:  Pharyngitis, unspecified etiology      NEW MEDICATIONS STARTED DURING THIS VISIT:  ED Discharge Orders    None       Note:  This document was prepared using Dragon voice recognition software and may include unintentional dictation errors.    Don Perking, [place], MD 12/21/18 [phone]

## 2018-12-29 ENCOUNTER — Other Ambulatory Visit: Payer: Self-pay

## 2018-12-29 DIAGNOSIS — Z5321 Procedure and treatment not carried out due to patient leaving prior to being seen by health care provider: Secondary | ICD-10-CM | POA: Diagnosis not present

## 2018-12-29 DIAGNOSIS — M79606 Pain in leg, unspecified: Secondary | ICD-10-CM | POA: Diagnosis not present

## 2018-12-29 DIAGNOSIS — R070 Pain in throat: Secondary | ICD-10-CM | POA: Insufficient documentation

## 2018-12-29 LAB — CBC WITH DIFFERENTIAL/PLATELET
Abs Immature Granulocytes: 0.05 10*3/uL (ref 0.00–0.07)
Basophils Absolute: 0 10*3/uL (ref 0.0–0.1)
Basophils Relative: 0 %
Eosinophils Absolute: 0.1 10*3/uL (ref 0.0–0.5)
Eosinophils Relative: 1 %
HCT: 43.6 % (ref 39.0–52.0)
Hemoglobin: 14.2 g/dL (ref 13.0–17.0)
Immature Granulocytes: 1 %
Lymphocytes Relative: 17 %
Lymphs Abs: 1.9 10*3/uL (ref 0.7–4.0)
MCH: 31.9 pg (ref 26.0–34.0)
MCHC: 32.6 g/dL (ref 30.0–36.0)
MCV: 98 fL (ref 80.0–100.0)
Monocytes Absolute: 0.9 10*3/uL (ref 0.1–1.0)
Monocytes Relative: 8 %
Neutro Abs: 8.1 10*3/uL — ABNORMAL HIGH (ref 1.7–7.7)
Neutrophils Relative %: 73 %
Platelets: 245 10*3/uL (ref 150–400)
RBC: 4.45 MIL/uL (ref 4.22–5.81)
RDW: 12.9 % (ref 11.5–15.5)
WBC: 11 10*3/uL — ABNORMAL HIGH (ref 4.0–10.5)
nRBC: 0 % (ref 0.0–0.2)

## 2018-12-29 LAB — BASIC METABOLIC PANEL
Anion gap: 10 (ref 5–15)
BUN: 18 mg/dL (ref 6–20)
CO2: 26 mmol/L (ref 22–32)
Calcium: 8.9 mg/dL (ref 8.9–10.3)
Chloride: 103 mmol/L (ref 98–111)
Creatinine, Ser: 0.67 mg/dL (ref 0.61–1.24)
GFR calc Af Amer: >60 mL/min (ref >60)
GFR calc non Af Amer: >60 mL/min (ref >60)
Glucose, Bld: 102 mg/dL — ABNORMAL HIGH (ref 70–99)
Potassium: 4.2 mmol/L (ref 3.5–5.1)
Sodium: 139 mmol/L (ref 135–145)

## 2018-12-29 NOTE — ED Triage Notes (Addendum)
Pt ambulatory to triage, pt arrives c/o being unable to taste and having pain in throat. Pt states he was tested for covid 1 week ago, resulted negative. Denies cough or fever. Pt also c/o right leg pain. Denies injury to leg, pt states this is chronic pain.

## 2018-12-30 ENCOUNTER — Emergency Department
Admission: EM | Admit: 2018-12-30 | Discharge: 2018-12-30 | Disposition: A | Discharge status: Home / Self Care | Payer: Medicaid Other | Attending: Emergency Medicine | Admitting: Emergency Medicine

## 2018-12-30 NOTE — ED Notes (Signed)
Pt asked about wait time. Writer explained that I couldn't give exact time to room pt but pt is next to go back. Pt then stated that he would leave if we didn't get him back soon.

## 2018-12-30 NOTE — ED Notes (Signed)
When getting pt vtial signs, asked how much longer it would be, told pt I could not give them an exact time but they are the next to go.

## 2019-01-03 ENCOUNTER — Other Ambulatory Visit: Payer: Self-pay

## 2019-01-03 ENCOUNTER — Encounter: Payer: Self-pay | Admitting: *Deleted

## 2019-01-03 DIAGNOSIS — R070 Pain in throat: Secondary | ICD-10-CM | POA: Insufficient documentation

## 2019-01-03 DIAGNOSIS — Z5321 Procedure and treatment not carried out due to patient leaving prior to being seen by health care provider: Secondary | ICD-10-CM | POA: Diagnosis not present

## 2019-01-03 NOTE — ED Triage Notes (Signed)
Sore throat x 2 weeks, has ENT appointment at [facility] in January.

## 2019-01-04 ENCOUNTER — Emergency Department
Admission: EM | Admit: 2019-01-04 | Discharge: 2019-01-04 | Disposition: A | Discharge status: Home / Self Care | Payer: Medicaid Other | Attending: Emergency Medicine | Admitting: Emergency Medicine

## 2019-01-04 NOTE — ED Notes (Signed)
Pt st leaving now due to long wait 

## 2019-01-05 ENCOUNTER — Encounter: Payer: Self-pay | Admitting: Emergency Medicine

## 2019-01-05 ENCOUNTER — Other Ambulatory Visit: Payer: Self-pay

## 2019-01-05 DIAGNOSIS — J029 Acute pharyngitis, unspecified: Secondary | ICD-10-CM | POA: Insufficient documentation

## 2019-01-05 DIAGNOSIS — Z5321 Procedure and treatment not carried out due to patient leaving prior to being seen by health care provider: Secondary | ICD-10-CM | POA: Diagnosis not present

## 2019-01-05 DIAGNOSIS — R05 Cough: Secondary | ICD-10-CM | POA: Diagnosis not present

## 2019-01-05 NOTE — ED Triage Notes (Signed)
Patient ambulatory to triage with steady gait, without difficulty or distress noted, mask in place; pt reports sore throat & cough x 2wks

## 2019-01-06 ENCOUNTER — Emergency Department
Admission: EM | Admit: 2019-01-06 | Discharge: 2019-01-06 | Disposition: A | Discharge status: Home / Self Care | Payer: Medicaid Other | Attending: Emergency Medicine | Admitting: Emergency Medicine

## 2019-01-06 NOTE — ED Notes (Signed)
No answer when called several times from lobby 

## 2019-01-07 ENCOUNTER — Other Ambulatory Visit: Payer: Self-pay

## 2019-01-07 DIAGNOSIS — J029 Acute pharyngitis, unspecified: Secondary | ICD-10-CM | POA: Insufficient documentation

## 2019-01-07 DIAGNOSIS — Z5321 Procedure and treatment not carried out due to patient leaving prior to being seen by health care provider: Secondary | ICD-10-CM | POA: Insufficient documentation

## 2019-01-07 NOTE — ED Triage Notes (Signed)
Patient ambulatory with steady gait without difficulty or distress.  Patient reports for past several weeks he "blows" brown blood out of his nose.  Patient also reports having a sore throat.

## 2019-01-08 ENCOUNTER — Emergency Department
Admission: EM | Admit: 2019-01-08 | Discharge: 2019-01-08 | Disposition: A | Discharge status: Home / Self Care | Payer: Medicaid Other | Attending: Emergency Medicine | Admitting: Emergency Medicine

## 2019-01-20 ENCOUNTER — Encounter: Payer: Self-pay | Admitting: Emergency Medicine

## 2019-01-20 ENCOUNTER — Other Ambulatory Visit: Payer: Self-pay

## 2019-01-20 DIAGNOSIS — Z5321 Procedure and treatment not carried out due to patient leaving prior to being seen by health care provider: Secondary | ICD-10-CM | POA: Diagnosis not present

## 2019-01-20 DIAGNOSIS — M79604 Pain in right leg: Secondary | ICD-10-CM | POA: Insufficient documentation

## 2019-01-20 DIAGNOSIS — M5489 Other dorsalgia: Secondary | ICD-10-CM | POA: Insufficient documentation

## 2019-01-20 NOTE — ED Triage Notes (Signed)
Patient here with complaint of chronic left leg and back pain.

## 2019-01-21 ENCOUNTER — Other Ambulatory Visit: Payer: Self-pay

## 2019-01-21 ENCOUNTER — Emergency Department
Admission: EM | Admit: 2019-01-21 | Discharge: 2019-01-21 | Disposition: A | Discharge status: Home / Self Care | Payer: Medicaid Other | Attending: Emergency Medicine | Admitting: Emergency Medicine

## 2019-01-21 DIAGNOSIS — Z5321 Procedure and treatment not carried out due to patient leaving prior to being seen by health care provider: Secondary | ICD-10-CM | POA: Insufficient documentation

## 2019-01-21 DIAGNOSIS — M549 Dorsalgia, unspecified: Secondary | ICD-10-CM | POA: Insufficient documentation

## 2019-01-21 DIAGNOSIS — M79604 Pain in right leg: Secondary | ICD-10-CM | POA: Insufficient documentation

## 2019-01-21 NOTE — ED Notes (Signed)
Patient to stat desk in no acute distress. Patient asking about wait time. Patient states that he is leaving at this time that he is not waiting.

## 2019-01-21 NOTE — ED Triage Notes (Signed)
Recurrent RLE pain, back pain.

## 2019-01-22 ENCOUNTER — Emergency Department
Admission: EM | Admit: 2019-01-22 | Discharge: 2019-01-22 | Disposition: A | Discharge status: Home / Self Care | Payer: Medicaid Other | Source: Home / Self Care

## 2019-01-25 ENCOUNTER — Emergency Department
Admission: EM | Admit: 2019-01-25 | Discharge: 2019-01-26 | Disposition: A | Discharge status: Home / Self Care | Payer: Medicaid Other | Attending: Emergency Medicine | Admitting: Emergency Medicine

## 2019-01-25 ENCOUNTER — Other Ambulatory Visit: Payer: Self-pay

## 2019-01-25 ENCOUNTER — Encounter: Payer: Self-pay | Admitting: Emergency Medicine

## 2019-01-25 DIAGNOSIS — Z5321 Procedure and treatment not carried out due to patient leaving prior to being seen by health care provider: Secondary | ICD-10-CM | POA: Insufficient documentation

## 2019-01-25 DIAGNOSIS — M79604 Pain in right leg: Secondary | ICD-10-CM | POA: Insufficient documentation

## 2019-01-25 NOTE — ED Triage Notes (Signed)
Patient to ER for continued right leg pain. Denies new injury.

## 2019-01-26 NOTE — ED Notes (Signed)
Called several times from lobby with no answer 

## 2019-01-26 NOTE — ED Notes (Signed)
No answer when called several times from lobby 

## 2019-01-26 NOTE — ED Notes (Signed)
No answer when called several times from lobby; no answer when cell phone called as well

## 2019-02-01 ENCOUNTER — Emergency Department
Admission: EM | Admit: 2019-02-01 | Discharge: 2019-02-01 | Disposition: A | Discharge status: Home / Self Care | Payer: Medicaid Other | Attending: Emergency Medicine | Admitting: Emergency Medicine

## 2019-02-01 ENCOUNTER — Other Ambulatory Visit: Payer: Self-pay

## 2019-02-01 ENCOUNTER — Encounter: Payer: Self-pay | Admitting: *Deleted

## 2019-02-01 DIAGNOSIS — M545 Low back pain, unspecified: Secondary | ICD-10-CM

## 2019-02-01 DIAGNOSIS — Z87891 Personal history of nicotine dependence: Secondary | ICD-10-CM | POA: Diagnosis not present

## 2019-02-01 DIAGNOSIS — Z79899 Other long term (current) drug therapy: Secondary | ICD-10-CM | POA: Insufficient documentation

## 2019-02-01 DIAGNOSIS — G8929 Other chronic pain: Secondary | ICD-10-CM | POA: Diagnosis not present

## 2019-02-01 DIAGNOSIS — M79604 Pain in right leg: Secondary | ICD-10-CM | POA: Diagnosis present

## 2019-02-01 MED ORDER — LIDOCAINE 5 % EX PTCH
1.0000 | MEDICATED_PATCH | CUTANEOUS | Status: DC
  Administered 2019-02-01: 1 via TRANSDERMAL
  Filled 2019-02-01: qty 1

## 2019-02-01 NOTE — ED Triage Notes (Signed)
Pt ambulatory to triage.  Pt has right leg pain.  Pt states back pain with pain radiating into right leg.  Hx spinal surgery.  Pt alert  Speech clear.

## 2019-02-02 NOTE — ED Provider Notes (Signed)
[facility] Emergency Department Provider Note  ____________________________________________   First MD Initiated Contact with Patient 02/01/19 2333     (approximate)  I have reviewed the triage vital signs and the nursing notes.   HISTORY  Chief Complaint Leg Pain    HPI Shane [name] is a 59 y.o. male with below list of previous medical conditions well-known to the [facility] emergency department as the patient has had 39 visits in the last 6 months presents to the emergency department secondary to low back pain with radiation down the right leg.  Patient states that he has an appointment with Dr. Wynn Banker" at [facility] on Friday.  Patient states that current pain score is 11 out of 10.  Patient denies any bladder or bowel incontinence.  No fever.       Past Medical History:  Diagnosis Date  . Anemia   . Anxiety   . Drug-seeking behavior    hx of narcotics dependence, previously discharged from pain management clinic  . Encounter for blood transfusion 2005, 02/2014  . GERD (gastroesophageal reflux disease)   . Hemorrhoid   . Herniated cervical disc     Patient Active Problem List   Diagnosis Date Noted  . Antisocial personality disorder ([place])   . Malingering   . Hemorrhoid 05/09/2014  . Skin abnormality 05/09/2014    Past Surgical History:  Procedure Laterality Date  . CERVICAL FUSION    . COLONOSCOPY  2/16   Dr. Vira Agar  . FRACTURE SURGERY Left    fractured femur  . prolapsed rectum surgery  04/30/1995   [facility], trans abdominal mesh repair per patient report  . SEPTOPLASTY      Prior to Admission medications   Medication Sig Start Date End Date Taking? Authorizing Provider  cyclobenzaprine (FLEXERIL) 10 MG tablet Take 1 tablet by mouth 2 (two) times daily as needed. 09/04/16   [provider]  docusate sodium (COLACE) 100 MG capsule Take 250 mg by mouth 2 (two) times daily.     [provider]  Ferrous Sulfate (IRON)  325 (65 FE) MG TABS Take 1 tablet by mouth daily.    [provider]  fluticasone (FLONASE) 50 MCG/ACT nasal spray Place 1 spray into both nostrils daily.  04/20/14   [provider]  gabapentin (NEURONTIN) 100 MG capsule Take 1 capsule (100 mg total) by mouth 3 (three) times daily. 11/13/18 11/13/19  Rudene Re, MD  glycerin adult 2 g suppository Place 1 suppository rectally as needed for constipation. 09/25/17   Nena Polio, MD  hydrocortisone (ANUSOL-HC) 25 MG suppository Place 1 suppository (25 mg total) rectally 2 (two) times daily. 05/22/14   Robert Bellow, MD  hydrocortisone (PROCTOSOL HC) 2.5 % rectal cream Place 1 application rectally 2 (two) times daily. 02/27/17   Earleen Newport, MD  hydrocortisone 2.5 % cream Apply 1 application topically daily. 11/30/16   [provider]  hydrocortisone-pramoxine (PROCTOFOAM HC) rectal foam Place 1 applicator rectally 2 (two) times daily. 12/22/16   Hinda Kehr, MD  hydrOXYzine (ATARAX/VISTARIL) 10 MG tablet Take 1 tablet (10 mg total) by mouth 3 (three) times daily as needed for itching. 04/12/16   Schaevitz, Randall An, MD  ketorolac (TORADOL) 10 MG tablet Take 1 tablet (10 mg total) by mouth every 6 (six) hours as needed. 11/21/18   Rudene Re, MD  magic mouthwash w/lidocaine SOLN Take 5 mLs by mouth 4 (four) times daily. 12/21/18   Cuthriell, Charline Bills, PA-C  meloxicam (MOBIC) 15 MG tablet Take 1 tablet (15 mg total) by mouth daily. 11/21/18   Cuthriell, Delorise Royals, PA-C  methocarbamol (ROBAXIN) 750 MG tablet Take 1 tablet (750 mg total) by mouth every 8 (eight) hours as needed for muscle spasms. 11/13/18   Don Perking, [place], MD  omeprazole (PRILOSEC) 20 MG capsule Take 20 mg by mouth daily.  04/16/14   [provider]  pantoprazole (PROTONIX) 40 MG tablet Take 40 mg by mouth daily. 11/30/16   [provider]  terbinafine (LAMISIL) 250 MG tablet Take 250 mg by mouth daily. 08/11/16    [provider]    Allergies Codeine and Amoxicillin  No family history on file.  Social History Social History   Tobacco Use  . Smoking status: Former Smoker    Years: 20.00    Quit date: 01/05/2005    Years since quitting: 14.0  . Smokeless tobacco: Current User  Substance Use Topics  . Alcohol use: Not Currently    Alcohol/week: 0.0 standard drinks  . Drug use: No    Review of Systems Constitutional: No fever/chills Eyes: No visual changes. ENT: No sore throat. Cardiovascular: Denies chest pain. Respiratory: Denies shortness of breath. Gastrointestinal: No abdominal pain.  No nausea, no vomiting.  No diarrhea.  No constipation. Genitourinary: Negative for dysuria. Musculoskeletal: Negative for neck pain.  Positive for back pain. Integumentary: Negative for rash. Neurological: Negative for headaches, focal weakness or numbness.  ____________________________________________   PHYSICAL EXAM:  VITAL SIGNS: ED Triage Vitals  Enc Vitals Group     BP 02/01/19 2240 109/65     Pulse Rate 02/01/19 2240 93     Resp 02/01/19 2240 20     Temp 02/01/19 2240 98.2 F (36.8 C)     Temp Source 02/01/19 2240 Oral     SpO2 02/01/19 2240 98 %     Weight 02/01/19 2239 72.6 kg (160 lb)     Height 02/01/19 2239 1.753 m (5\' 9" )     Head Circumference --      Peak Flow --      Pain Score 02/01/19 2239 10     Pain Loc --      Pain Edu? --      Excl. in GC? --     Constitutional: Alert and oriented.  Eyes: Conjunctivae are normal.  Mouth/Throat: Patient is wearing a mask. Neck: No stridor.  No meningeal signs.   Cardiovascular: Normal rate, regular rhythm. Good peripheral circulation. Grossly normal heart sounds. Respiratory: Normal respiratory effort.  No retractions. Gastrointestinal: Soft and nontender. No distention.  Musculoskeletal: No lower extremity tenderness nor edema. No gross deformities of extremities. Neurologic:  Normal speech and language. No gross  focal neurologic deficits are appreciated.  Skin:  Skin is warm, dry and intact. Psychiatric: Mood and affect are normal. Speech and behavior are normal.    Procedures   ____________________________________________   INITIAL IMPRESSION / MDM / ASSESSMENT AND PLAN / ED COURSE  As part of my medical decision making, I reviewed the following data within the electronic MEDICAL RECORD NUMBER 59 year old male presented with above-stated history and physical exam secondary to chronic low back pain with radiation down the right leg.  Patient without any neurological deficits.  Patient requesting tramadol.  I informed the patient that I would not be prescribing a narcotic prescription and offered gabapentin which he refused I also offered Toradol which he also refused.  Patient eloped from the emergency department before receiving discharge paperwork ____________________________________________  FINAL CLINICAL IMPRESSION(S) / ED DIAGNOSES  Final diagnoses:  Chronic right-sided low back pain without sciatica     MEDICATIONS GIVEN DURING THIS VISIT:  Medications  lidocaine (LIDODERM) 5 % 1 patch (1 patch Transdermal Patch Applied 02/01/19 2345)     ED Discharge Orders    None      *Please note:  Shane [name] was evaluated in Emergency Department on 02/02/2019 for the symptoms described in the history of present illness. He was evaluated in the context of the global COVID-19 pandemic, which necessitated consideration that the patient might be at risk for infection with the SARS-CoV-2 virus that causes COVID-19. Institutional protocols and algorithms that pertain to the evaluation of patients at risk for COVID-19 are in a state of rapid change based on information released by regulatory bodies including the CDC and federal and state organizations. These policies and algorithms were followed during the patient's care in the ED.  Some ED evaluations and interventions may be delayed as a result of  limited staffing during the pandemic.*  Note:  This document was prepared using Dragon voice recognition software and may include unintentional dictation errors.   Darci Current, MD 02/02/19 [phone]

## 2019-02-04 ENCOUNTER — Encounter: Payer: Self-pay | Admitting: Emergency Medicine

## 2019-02-04 ENCOUNTER — Other Ambulatory Visit: Payer: Self-pay

## 2019-02-04 DIAGNOSIS — Z5321 Procedure and treatment not carried out due to patient leaving prior to being seen by health care provider: Secondary | ICD-10-CM | POA: Insufficient documentation

## 2019-02-04 DIAGNOSIS — M79604 Pain in right leg: Secondary | ICD-10-CM | POA: Diagnosis not present

## 2019-02-04 NOTE — ED Triage Notes (Signed)
Patient with complaint of chronic right leg pain.

## 2019-02-05 ENCOUNTER — Emergency Department
Admission: EM | Admit: 2019-02-05 | Discharge: 2019-02-05 | Disposition: A | Discharge status: Home / Self Care | Payer: Medicaid Other | Attending: Emergency Medicine | Admitting: Emergency Medicine

## 2019-02-06 ENCOUNTER — Encounter: Payer: Self-pay | Admitting: Emergency Medicine

## 2019-02-06 ENCOUNTER — Other Ambulatory Visit: Payer: Self-pay

## 2019-02-06 DIAGNOSIS — K625 Hemorrhage of anus and rectum: Secondary | ICD-10-CM | POA: Diagnosis not present

## 2019-02-06 DIAGNOSIS — M545 Low back pain: Secondary | ICD-10-CM | POA: Insufficient documentation

## 2019-02-06 DIAGNOSIS — M79604 Pain in right leg: Secondary | ICD-10-CM | POA: Diagnosis not present

## 2019-02-06 DIAGNOSIS — Z5321 Procedure and treatment not carried out due to patient leaving prior to being seen by health care provider: Secondary | ICD-10-CM | POA: Insufficient documentation

## 2019-02-06 LAB — COMPREHENSIVE METABOLIC PANEL
ALT: 21 U/L (ref 0–44)
AST: 23 U/L (ref 15–41)
Albumin: 3.5 g/dL (ref 3.5–5.0)
Alkaline Phosphatase: 81 U/L (ref 38–126)
Anion gap: 10 (ref 5–15)
BUN: 11 mg/dL (ref 6–20)
CO2: 25 mmol/L (ref 22–32)
Calcium: 8.9 mg/dL (ref 8.9–10.3)
Chloride: 104 mmol/L (ref 98–111)
Creatinine, Ser: 0.73 mg/dL (ref 0.61–1.24)
GFR calc Af Amer: >60 mL/min (ref >60)
GFR calc non Af Amer: >60 mL/min (ref >60)
Glucose, Bld: 105 mg/dL — ABNORMAL HIGH (ref 70–99)
Potassium: 4.3 mmol/L (ref 3.5–5.1)
Sodium: 139 mmol/L (ref 135–145)
Total Bilirubin: 0.8 mg/dL (ref 0.3–1.2)
Total Protein: 6.5 g/dL (ref 6.5–8.1)

## 2019-02-06 LAB — CBC
HCT: 43.8 % (ref 39.0–52.0)
Hemoglobin: 14.4 g/dL (ref 13.0–17.0)
MCH: 32.4 pg (ref 26.0–34.0)
MCHC: 32.9 g/dL (ref 30.0–36.0)
MCV: 98.4 fL (ref 80.0–100.0)
Platelets: 255 10*3/uL (ref 150–400)
RBC: 4.45 MIL/uL (ref 4.22–5.81)
RDW: 12.9 % (ref 11.5–15.5)
WBC: 9.1 10*3/uL (ref 4.0–10.5)
nRBC: 0 % (ref 0.0–0.2)

## 2019-02-06 NOTE — ED Triage Notes (Signed)
Pt to ED from home c/o lower back pain radiating down right leg.  Pt ambulatory in triage.  Pt also c/o rectal bleeding 4-5 years, states bright red bleeding on toilet paper and in bowl today.  VORB from Dr. Erma Heritage for basic lab work.

## 2019-02-07 ENCOUNTER — Emergency Department
Admission: EM | Admit: 2019-02-07 | Discharge: 2019-02-07 | Disposition: A | Discharge status: Home / Self Care | Payer: Medicaid Other | Attending: Emergency Medicine | Admitting: Emergency Medicine

## 2019-02-14 ENCOUNTER — Encounter: Payer: Self-pay | Admitting: Emergency Medicine

## 2019-02-14 ENCOUNTER — Other Ambulatory Visit: Payer: Self-pay

## 2019-02-14 DIAGNOSIS — Z5321 Procedure and treatment not carried out due to patient leaving prior to being seen by health care provider: Secondary | ICD-10-CM | POA: Diagnosis not present

## 2019-02-14 DIAGNOSIS — M79604 Pain in right leg: Secondary | ICD-10-CM | POA: Diagnosis not present

## 2019-02-14 NOTE — ED Triage Notes (Signed)
Pt to ED from home c/o right leg pain, chronically.  States pain is the same, has MRI scheduled March 3rd.

## 2019-02-15 ENCOUNTER — Emergency Department
Admission: EM | Admit: 2019-02-15 | Discharge: 2019-02-15 | Disposition: A | Discharge status: Home / Self Care | Payer: Medicaid Other | Attending: Emergency Medicine | Admitting: Emergency Medicine

## 2019-02-15 ENCOUNTER — Other Ambulatory Visit: Payer: Self-pay

## 2019-02-15 DIAGNOSIS — R42 Dizziness and giddiness: Secondary | ICD-10-CM | POA: Insufficient documentation

## 2019-02-15 DIAGNOSIS — Z5321 Procedure and treatment not carried out due to patient leaving prior to being seen by health care provider: Secondary | ICD-10-CM | POA: Insufficient documentation

## 2019-02-15 LAB — CBC
HCT: 45 % (ref 39.0–52.0)
Hemoglobin: 14.7 g/dL (ref 13.0–17.0)
MCH: 32.7 pg (ref 26.0–34.0)
MCHC: 32.7 g/dL (ref 30.0–36.0)
MCV: 100.2 fL — ABNORMAL HIGH (ref 80.0–100.0)
Platelets: 242 10*3/uL (ref 150–400)
RBC: 4.49 MIL/uL (ref 4.22–5.81)
RDW: 12.7 % (ref 11.5–15.5)
WBC: 8.1 10*3/uL (ref 4.0–10.5)
nRBC: 0 % (ref 0.0–0.2)

## 2019-02-15 NOTE — ED Notes (Signed)
No answer when called several times from lobby 

## 2019-02-15 NOTE — ED Triage Notes (Signed)
Pt states he has been feeling dizzy today, states mouth dry, no fever. Pt denies any n.v.d. or recent illness.

## 2019-02-16 ENCOUNTER — Emergency Department
Admission: EM | Admit: 2019-02-16 | Discharge: 2019-02-16 | Disposition: A | Discharge status: Home / Self Care | Payer: Medicaid Other | Attending: Emergency Medicine | Admitting: Emergency Medicine

## 2019-02-16 LAB — COMPREHENSIVE METABOLIC PANEL
ALT: 20 U/L (ref 0–44)
AST: 24 U/L (ref 15–41)
Albumin: 3.8 g/dL (ref 3.5–5.0)
Alkaline Phosphatase: 81 U/L (ref 38–126)
Anion gap: 9 (ref 5–15)
BUN: 11 mg/dL (ref 6–20)
CO2: 28 mmol/L (ref 22–32)
Calcium: 8.8 mg/dL — ABNORMAL LOW (ref 8.9–10.3)
Chloride: 105 mmol/L (ref 98–111)
Creatinine, Ser: 0.85 mg/dL (ref 0.61–1.24)
GFR calc Af Amer: >60 mL/min (ref >60)
GFR calc non Af Amer: >60 mL/min (ref >60)
Glucose, Bld: 86 mg/dL (ref 70–99)
Potassium: 4.2 mmol/L (ref 3.5–5.1)
Sodium: 142 mmol/L (ref 135–145)
Total Bilirubin: 0.9 mg/dL (ref 0.3–1.2)
Total Protein: 6.8 g/dL (ref 6.5–8.1)

## 2019-02-16 LAB — URINALYSIS, COMPLETE (UACMP) WITH MICROSCOPIC
Bacteria, UA: NONE SEEN
Bilirubin Urine: NEGATIVE
Glucose, UA: NEGATIVE mg/dL
Ketones, ur: NEGATIVE mg/dL
Leukocytes,Ua: NEGATIVE
Nitrite: NEGATIVE
Protein, ur: NEGATIVE mg/dL
Specific Gravity, Urine: 1.006 (ref 1.005–1.030)
Squamous Epithelial / HPF: NONE SEEN (ref 0–5)
WBC, UA: NONE SEEN WBC/hpf (ref 0–5)
pH: 6 (ref 5.0–8.0)

## 2019-02-16 LAB — TROPONIN I (HIGH SENSITIVITY): Troponin I (High Sensitivity): 3 ng/L (ref <18)

## 2019-02-18 ENCOUNTER — Encounter: Payer: Self-pay | Admitting: Emergency Medicine

## 2019-02-18 ENCOUNTER — Other Ambulatory Visit: Payer: Self-pay

## 2019-02-18 DIAGNOSIS — Z5321 Procedure and treatment not carried out due to patient leaving prior to being seen by health care provider: Secondary | ICD-10-CM | POA: Insufficient documentation

## 2019-02-18 DIAGNOSIS — M79604 Pain in right leg: Secondary | ICD-10-CM | POA: Insufficient documentation

## 2019-02-18 NOTE — ED Triage Notes (Signed)
Pt arrives ambulatory to triage with c/o right leg pain. Pt states that "I will be happy when this MRI takes place". Pt is in NAD.

## 2019-02-18 NOTE — ED Notes (Signed)
Pt sitting in lobby on phone in no acute distress.  

## 2019-02-19 ENCOUNTER — Emergency Department
Admission: EM | Admit: 2019-02-19 | Discharge: 2019-02-19 | Disposition: A | Discharge status: Home / Self Care | Payer: Medicaid Other | Attending: Emergency Medicine | Admitting: Emergency Medicine

## 2019-02-19 ENCOUNTER — Inpatient Hospital Stay: Admission: RE | Admit: 2019-02-19 | Payer: Medicaid Other | Source: Home / Self Care

## 2019-03-17 ENCOUNTER — Other Ambulatory Visit: Payer: Self-pay

## 2019-03-17 ENCOUNTER — Encounter: Payer: Self-pay | Admitting: Emergency Medicine

## 2019-03-17 DIAGNOSIS — M79604 Pain in right leg: Secondary | ICD-10-CM | POA: Insufficient documentation

## 2019-03-17 DIAGNOSIS — Z5321 Procedure and treatment not carried out due to patient leaving prior to being seen by health care provider: Secondary | ICD-10-CM | POA: Insufficient documentation

## 2019-03-17 NOTE — ED Triage Notes (Signed)
Patient with complaint of chronic right leg pain.  

## 2019-03-18 ENCOUNTER — Emergency Department
Admission: EM | Admit: 2019-03-18 | Discharge: 2019-03-18 | Disposition: A | Discharge status: Home / Self Care | Payer: Medicaid Other | Attending: Emergency Medicine | Admitting: Emergency Medicine

## 2019-03-20 ENCOUNTER — Other Ambulatory Visit: Payer: Self-pay

## 2019-03-20 ENCOUNTER — Emergency Department
Admission: EM | Admit: 2019-03-20 | Discharge: 2019-03-20 | Disposition: A | Discharge status: Home / Self Care | Payer: Medicaid Other | Attending: Emergency Medicine | Admitting: Emergency Medicine

## 2019-03-20 ENCOUNTER — Encounter: Payer: Self-pay | Admitting: Emergency Medicine

## 2019-03-20 DIAGNOSIS — Z5321 Procedure and treatment not carried out due to patient leaving prior to being seen by health care provider: Secondary | ICD-10-CM | POA: Insufficient documentation

## 2019-03-20 DIAGNOSIS — M549 Dorsalgia, unspecified: Secondary | ICD-10-CM | POA: Diagnosis not present

## 2019-03-20 NOTE — ED Triage Notes (Signed)
Patient ambulatory to triage with steady gait, without difficulty or distress noted; pt reports back pain; hx chronic back pain, seen multiples times for same; st he has a MRI sched this month

## 2019-12-19 ENCOUNTER — Ambulatory Visit: Attending: Internal Medicine | Primary: Internal Medicine

## 2019-12-19 ENCOUNTER — Ambulatory Visit: Admit: 2019-12-19 | Discharge: 2019-12-19 | Payer: MEDICAID | Attending: Internal Medicine | Primary: Internal Medicine

## 2019-12-19 DIAGNOSIS — G8929 Other chronic pain: Secondary | ICD-10-CM

## 2019-12-19 DIAGNOSIS — M5441 Lumbago with sciatica, right side: Secondary | ICD-10-CM

## 2019-12-19 MED ORDER — METHOCARBAMOL 750 MG TAB
750 mg | ORAL_TABLET | Freq: Four times a day (QID) | ORAL | 1 refills | Status: DC
Start: 2019-12-19 — End: 2020-03-06

## 2019-12-19 MED ORDER — HYDROCORTISONE 2.5 % RECTAL CREAM
2.5 % | Freq: Four times a day (QID) | CUTANEOUS | 1 refills | Status: DC
Start: 2019-12-19 — End: 2020-03-06

## 2019-12-19 NOTE — Progress Notes (Signed)
NEW PATIENT VISIT    Subjective:    [name] is a 59 y.o., male,   Chief Complaint   Patient presents with   ??? New Patient       HPI:    [name] is a 59 y.o., male who presents today for a new patient appointment.  The patient is actually a full-time resident of [place].  His primary care physician in [place] has been Dr. Maryellen PileEason.  The patient is here visiting a friend.  He will be returning to [place] in 1 to 2 weeks. He presents today with 3 primary concerns.      He reports chronic low back pain with intermittent right sciatica.  No motor weakness.  No bowel or bladder symptoms.  He sees a [facility] in [place] named Dr. Henreitta LeberMoe Lim.  He has failed conservative management including physical therapy.  Tylenol does not help.  Nonsteroidal anti-inflammatories do not help.  He declines gabapentin or Lyrica due to perceived side effects.  He had been receiving small supplies of tramadol and/or hydrocodone primarily from emergency rooms.  He had been enrolled in pain management in [place] several years ago but was lost to follow-up.  He states he probably needs surgery but Dr. Jaynie CollinsLim has requested the patient have an MRI and he has severe claustrophobia and is afraid to have the MRI done.  Thus he has simply been living with pain.      He also has had chronic bright red blood per rectum due to known internal hemorrhoids.  He states he needs to schedule hemorrhoidectomy or banding procedure.  He will frequently see a small amount of blood on the toilet paper or in the toilet water after bowel movements.  It sounds like this has been happening for literally decades.  He states 10 or more years ago he developed anemia due to hemorrhoid bleeding.  He would like me to check a CBC to rule out anemia.  He did have labs performed in emergency room on 06/14/2019... Basic metabolic panel and CBC were normal at that time.    He also has an enlarged prostate.  He has mild chronic lower urinary tract  symptoms.  He would like me to also check his PSA to assuage his fear that he might have prostate cancer causing his urinary symptoms.    The following portions of the patient's history were reviewed and updated as appropriate:      Past Medical History:   Diagnosis Date   ??? BPH (benign prostatic hyperplasia)    ??? Chronic low back pain    ??? History of herpes genitalis    ??? Internal hemorrhoids    ??? Venous insufficiency of both lower extremities        Past Surgical History:   Procedure Laterality Date   ??? HX CERVICAL FUSION      anterior cervical diskectomy / fusion C4-5, C5-6   ??? HX OPEN REDUCTION INTERNAL FIXATION      femur   ??? HX SEPTOPLASTY  1983       Family History   Problem Relation Age of Onset   ??? No Known Problems Mother    ??? No Known Problems Father        Social History     Socioeconomic History   ??? Marital status: SINGLE     Spouse name: Not on file   ??? Number of children: Not on file   ??? Years of  education: Not on file   ??? Highest education level: Not on file   Occupational History     Comment: former profession wrestler   Tobacco Use   ??? Smoking status: Never Smoker   ??? Smokeless tobacco: Never Used   Substance and Sexual Activity   ??? Alcohol use: Not Currently   ??? Drug use: Never   ??? Sexual activity: Not on file   Other Topics Concern   ??? Not on file   Social History Narrative   ??? Not on file     Social Determinants of Health     Financial Resource Strain:    ??? Difficulty of Paying Living Expenses: Not on file   Food Insecurity:    ??? Worried About Running Out of Food in the Last Year: Not on file   ??? Ran Out of Food in the Last Year: Not on file   Transportation Needs:    ??? Lack of Transportation (Medical): Not on file   ??? Lack of Transportation (Non-Medical): Not on file   Physical Activity:    ??? Days of Exercise per Week: Not on file   ??? Minutes of Exercise per Session: Not on file   Stress:    ??? Feeling of Stress : Not on file   Social Connections:    ??? Frequency of Communication with Friends and  Family: Not on file   ??? Frequency of Social Gatherings with Friends and Family: Not on file   ??? Attends Religious Services: Not on file   ??? Active Member of Clubs or Organizations: Not on file   ??? Attends Banker Meetings: Not on file   ??? Marital Status: Not on file   Intimate Partner Violence:    ??? Fear of Current or Ex-Partner: Not on file   ??? Emotionally Abused: Not on file   ??? Physically Abused: Not on file   ??? Sexually Abused: Not on file   Housing Stability:    ??? Unable to Pay for Housing in the Last Year: Not on file   ??? Number of Places Lived in the Last Year: Not on file   ??? Unstable Housing in the Last Year: Not on file           Allergies as of 12/19/2019 - Fully Reviewed 12/19/2019   Allergen Reaction Noted   ??? Amoxicillin Rash 12/19/2019   ??? Codeine Nausea and Vomiting 12/19/2019       Review of Systems   Constitutional: Negative for appetite change and unexpected weight change.   HENT: Negative for drooling and voice change.    Eyes: Negative for photophobia and visual disturbance.   Respiratory: Negative for choking and stridor.    Cardiovascular: Negative for leg swelling.   Gastrointestinal: Negative for anal bleeding and rectal pain.   Endocrine: Negative for polydipsia and polyuria.   Genitourinary: Negative for enuresis and penile discharge.   Musculoskeletal: Negative for gait problem and neck stiffness.   Skin: Negative for pallor.   Allergic/Immunologic: Negative for immunocompromised state.   Neurological: Negative for facial asymmetry and speech difficulty.   Hematological: Negative for adenopathy. Does not bruise/bleed easily.   Psychiatric/Behavioral: Negative for hallucinations and suicidal ideas.            Patient Care Team:  Claudette Head, MD as PCP - General (Internal Medicine)    Objective:    Blood pressure 128/84, pulse 68, temperature 98.1 ??F (36.7 ??C), temperature source Temporal, resp. rate 18, height 5'  10" (1.778 m), weight 192 lb 9.6 oz (87.4 kg), SpO2 97  %.    Physical Exam  Vitals reviewed.   Constitutional:       General: He is not in acute distress.     Appearance: Normal appearance. He is not toxic-appearing.   HENT:      Head: Normocephalic and atraumatic.      Right Ear: Tympanic membrane, ear canal and external ear normal.      Left Ear: Tympanic membrane, ear canal and external ear normal.      Nose: Nose normal.      Mouth/Throat:      Mouth: Mucous membranes are moist.      Pharynx: Oropharynx is clear.   Eyes:      Extraocular Movements: Extraocular movements intact.      Conjunctiva/sclera: Conjunctivae normal.      Pupils: Pupils are equal, round, and reactive to light.   Cardiovascular:      Rate and Rhythm: Normal rate and regular rhythm.      Pulses: Normal pulses.      Heart sounds: Normal heart sounds.   Pulmonary:      Effort: Pulmonary effort is normal.      Breath sounds: Normal breath sounds.   Abdominal:      General: Abdomen is flat. Bowel sounds are normal.      Palpations: Abdomen is soft.   Musculoskeletal:         General: Normal range of motion.      Cervical back: Normal range of motion and neck supple.   Skin:     General: Skin is warm and dry.      Capillary Refill: Capillary refill takes less than 2 seconds.   Neurological:      General: No focal deficit present.      Mental Status: He is alert and oriented to person, place, and time. Mental status is at baseline.   Psychiatric:         Mood and Affect: Mood normal.         Behavior: Behavior normal.         Thought Content: Thought content normal.         Judgment: Judgment normal.              No results found for this or any previous visit.    Assessent & Plan      ICD-10-CM ICD-9-CM    1. Chronic low back pain with right-sided sciatica, unspecified back pain laterality  M54.41 724.2 methocarbamoL (ROBAXIN) 750 mg tablet    G89.29 724.3      338.29    2. Hemorrhoids, unspecified hemorrhoid type  K64.9 455.6 hydrocortisone (ANUSOL-HC) 2.5 % rectal cream   3. Rectal bleeding  K62.5  569.3 CBC WITH AUTOMATED DIFF   4. Prostate cancer screening  Z12.5 V76.44        Chronic Conditions Addressed Today     1. Chronic low back pain with right-sided sciatica - Primary     Overview      Patient reports chronic low back pain with intermittent right-sided sciatica.  Previously evaluated by spine surgeon Dr. Henreitta Leber in [place].    Patient advised that I am not a chronic pain management specialist.  He requested tramadol and or hydrocodone.  I recommended that he establish care with a pain management physician.  As he has only visiting [place] I am unable to make a local referral before he  will be returning to [facility].  I suggested he establish with a pain management physician in [place].  After some bargaining I did agree to provide him a prescription for methocarbamol 750 mg p.o. 4 times daily as needed which he states was somewhat helpful in the past and did not cause side effects.          Relevant Medications     methocarbamoL (ROBAXIN) 750 mg tablet    2. Hemorrhoids     Overview      Patient reports chronic internal hemorrhoids.  He is contemplating seeing a surgeon for banding or hemorrhoidectomy.  He will follow-up with a physician upon his return to [place].          Relevant Medications     hydrocortisone (ANUSOL-HC) 2.5 % rectal cream    3. Rectal bleeding     Overview      Patient reports chronic intermittent bright red blood per rectum.  States he was previously evaluated by physicians in [place] due to internal hemorrhoids.  Apparently he had anemia due to hemorrhoidal bleeding years ago.  He requests a CBC.          Relevant Orders     CBC WITH AUTOMATED DIFF    4. Prostate cancer screening     Overview      Patient has BPH with chronic mild LUTS.  He requested a screening PSA due to fear of prostate cancer.  PSA ordered.               The patient and/or patient representative voiced understanding and agreement with the current diagnoses,  recommendations, and possible side effects.    Follow-up and Dispositions    ?? Return if symptoms worsen or fail to improve.  Advised to follow-up with his [facility] physicians.Marland Kitchen

## 2019-12-21 ENCOUNTER — Encounter: Payer: MEDICAID | Primary: Internal Medicine

## 2019-12-25 ENCOUNTER — Encounter: Payer: MEDICAID | Primary: Internal Medicine

## 2019-12-26 ENCOUNTER — Encounter: Primary: Internal Medicine

## 2020-02-22 ENCOUNTER — Encounter: Attending: Internal Medicine | Primary: Internal Medicine

## 2020-02-27 ENCOUNTER — Encounter

## 2020-02-27 ENCOUNTER — Encounter: Admit: 2020-02-27 | Discharge: 2020-02-27 | Payer: MEDICAID | Primary: Internal Medicine

## 2020-02-27 ENCOUNTER — Telehealth

## 2020-02-27 DIAGNOSIS — K625 Hemorrhage of anus and rectum: Secondary | ICD-10-CM

## 2020-02-27 NOTE — Telephone Encounter (Signed)
Pt arrived for pre-OV lab draw stating he was supposed to get PSA checked - not included with printed lab order.    LOV 12/23/19 notes:  " Patient has BPH with chronic mild LUTS.  He requested a screening PSA due to fear of prostate cancer.  PSA ordered."    PSA ordered to draw today

## 2020-02-28 LAB — CBC WITH AUTOMATED DIFF
ABS. BASOPHILS: 0.1 10*3/uL (ref 0.0–0.2)
ABS. EOSINOPHILS: 0.2 10*3/uL (ref 0.0–0.4)
ABS. IMM. GRANS.: 0 10*3/uL (ref 0.0–0.1)
ABS. MONOCYTES: 1 10*3/uL — ABNORMAL HIGH (ref 0.1–0.9)
ABS. NEUTROPHILS: 5.3 10*3/uL (ref 1.4–7.0)
Abs Lymphocytes: 1.9 10*3/uL (ref 0.7–3.1)
BASOPHILS: 1 %
EOSINOPHILS: 2 %
HCT: 44 % (ref 37.5–51.0)
HGB: 14.7 g/dL (ref 13.0–17.7)
IMMATURE GRANULOCYTES: 0 %
Lymphocytes: 23 %
MCH: 31.9 pg (ref 26.6–33.0)
MCHC: 33.4 g/dL (ref 31.5–35.7)
MCV: 95 fL (ref 79–97)
MONOCYTES: 12 %
NEUTROPHILS: 62 %
PLATELET: 240 10*3/uL (ref 150–450)
RBC: 4.61 x10E6/uL (ref 4.14–5.80)
RDW: 12.5 % (ref 11.6–15.4)
WBC: 8.4 10*3/uL (ref 3.4–10.8)

## 2020-02-28 LAB — PSA, DIAGNOSTIC (PROSTATE SPECIFIC AG): Prostate Specific Ag: 2.5 ng/mL (ref 0.0–4.0)

## 2020-02-28 LAB — CBC WITH AUTO DIFFERENTIAL
Basophils %: 1 %
Basophils Absolute: 0.1 10*3/uL (ref 0.0–0.2)
Eosinophils %: 2 %
Eosinophils Absolute: 0.2 10*3/uL (ref 0.0–0.4)
Granulocyte Absolute Count: 0 10*3/uL (ref 0.0–0.1)
Hematocrit: 44 % (ref 37.5–51.0)
Hemoglobin: 14.7 g/dL (ref 13.0–17.7)
Immature Granulocytes: 0 %
Lymphocytes %: 23 %
Lymphocytes Absolute: 1.9 10*3/uL (ref 0.7–3.1)
MCH: 31.9 pg (ref 26.6–33.0)
MCHC: 33.4 g/dL (ref 31.5–35.7)
MCV: 95 fL (ref 79–97)
Monocytes %: 12 %
Monocytes Absolute: 1 10*3/uL — ABNORMAL HIGH (ref 0.1–0.9)
Neutrophils %: 62 %
Neutrophils Absolute: 5.3 10*3/uL (ref 1.4–7.0)
Platelets: 240 10*3/uL (ref 150–450)
RBC: 4.61 x10E6/uL (ref 4.14–5.80)
RDW: 12.5 % (ref 11.6–15.4)
WBC: 8.4 10*3/uL (ref 3.4–10.8)

## 2020-02-28 LAB — PSA PROSTATIC SPECIFIC ANTIGEN: PSA: 2.5 ng/mL (ref 0.0–4.0)

## 2020-02-28 NOTE — Telephone Encounter (Signed)
Test results reviewed.  Will discuss with patient next appointment    03/06/20

## 2020-03-06 ENCOUNTER — Ambulatory Visit: Attending: Internal Medicine | Primary: Internal Medicine

## 2020-03-06 ENCOUNTER — Ambulatory Visit: Admit: 2020-03-06 | Discharge: 2020-03-06 | Payer: MEDICAID | Attending: Internal Medicine | Primary: Internal Medicine

## 2020-03-06 DIAGNOSIS — K649 Unspecified hemorrhoids: Secondary | ICD-10-CM

## 2020-03-06 MED ORDER — METHOCARBAMOL 750 MG TAB
750 mg | ORAL_TABLET | Freq: Four times a day (QID) | ORAL | 1 refills | Status: AC
Start: 2020-03-06 — End: ?

## 2020-03-06 MED ORDER — OMEPRAZOLE 40 MG CAP, DELAYED RELEASE
40 mg | ORAL_CAPSULE | Freq: Every day | ORAL | 5 refills | Status: AC
Start: 2020-03-06 — End: ?

## 2020-03-06 NOTE — Progress Notes (Signed)
FOLLOW UP VISIT    Subjective:    Jeffrey Wright is a 60 y.o., male,   Chief Complaint   Patient presents with   ??? Follow Up Chronic Condition   ??? GERD       HPI:    The patient present today for follow up with several complaints.     He has chronic low back pain with right sided sciatica and paresthesias.  Reports subjective right leg weakness.  Wants an MRI and requests opiate medications.  Never saw pain management.  Never attended physical therapy.      Continues to have painless BRBPR after many bowel movements.  Shows me a toilet bowl picture on his iPhone.  He has what looks like a normal formed brown BM in water with superficial bright red blood on BM and staining water.  States he was evaluated while living in [place] and advised he had internal hemorrhoids.  Patient requests surgical referral for hemorrhoids.      Also has had chronic GERD.  Despite lifestyle modifications still has daily symptoms.  No dysphagia.  Previously treated with omeprazole 40 mg daily with good relief.  States 20 mg dose was not sufficient.      The following portions of the patient's history were reviewed and updated as appropriate:      Past Medical History:   Diagnosis Date   ??? BPH (benign prostatic hyperplasia)    ??? Chronic low back pain with right-sided sciatica    ??? History of herpes genitalis    ??? Internal hemorrhoids    ??? Venous insufficiency of both lower extremities        Past Surgical History:   Procedure Laterality Date   ??? HX CERVICAL FUSION      anterior cervical diskectomy / fusion C4-5, C5-6   ??? HX OPEN REDUCTION INTERNAL FIXATION      femur   ??? HX SEPTOPLASTY  1983       Family History   Problem Relation Age of Onset   ??? No Known Problems Mother    ??? No Known Problems Father        Social History     Socioeconomic History   ??? Marital status: SINGLE     Spouse name: Not on file   ??? Number of children: Not on file   ??? Years of education: Not on file   ??? Highest education level: Not on file   Occupational History     Comment:  former profession wrestler   Tobacco Use   ??? Smoking status: Never Smoker   ??? Smokeless tobacco: Never Used   Substance and Sexual Activity   ??? Alcohol use: Not Currently   ??? Drug use: Never   ??? Sexual activity: Not on file   Other Topics Concern   ??? Not on file   Social History Narrative   ??? Not on file     Social Determinants of Health     Financial Resource Strain:    ??? Difficulty of Paying Living Expenses: Not on file   Food Insecurity:    ??? Worried About Running Out of Food in the Last Year: Not on file   ??? Ran Out of Food in the Last Year: Not on file   Transportation Needs:    ??? Lack of Transportation (Medical): Not on file   ??? Lack of Transportation (Non-Medical): Not on file   Physical Activity:    ??? Days of Exercise per Week: Not  on file   ??? Minutes of Exercise per Session: Not on file   Stress:    ??? Feeling of Stress : Not on file   Social Connections:    ??? Frequency of Communication with Friends and Family: Not on file   ??? Frequency of Social Gatherings with Friends and Family: Not on file   ??? Attends Religious Services: Not on file   ??? Active Member of Clubs or Organizations: Not on file   ??? Attends Banker Meetings: Not on file   ??? Marital Status: Not on file   Intimate Partner Violence:    ??? Fear of Current or Ex-Partner: Not on file   ??? Emotionally Abused: Not on file   ??? Physically Abused: Not on file   ??? Sexually Abused: Not on file   Housing Stability:    ??? Unable to Pay for Housing in the Last Year: Not on file   ??? Number of Places Lived in the Last Year: Not on file   ??? Unstable Housing in the Last Year: Not on file       Current Outpatient Medications   Medication Sig Dispense Refill   ??? methocarbamoL (ROBAXIN) 750 mg tablet Take 1 Tablet by mouth four (4) times daily. 40 Tablet 1   ??? omeprazole (PRILOSEC) 40 mg capsule Take 1 Capsule by mouth daily. 30 Capsule 5       Allergies as of 03/06/2020 - Fully Reviewed 03/06/2020   Allergen Reaction Noted   ??? Amoxicillin Rash 12/19/2019    ??? Codeine Nausea and Vomiting 12/19/2019       Review of Systems   Constitutional: Negative for appetite change and unexpected weight change.   HENT: Negative for drooling and voice change.    Eyes: Negative for photophobia and visual disturbance.   Respiratory: Negative for choking and stridor.    Cardiovascular: Negative for leg swelling.   Gastrointestinal: Negative for anal bleeding and rectal pain.   Endocrine: Negative for polydipsia and polyuria.   Genitourinary: Negative for enuresis and penile discharge.   Musculoskeletal: Negative for gait problem and neck stiffness.   Skin: Negative for pallor.   Allergic/Immunologic: Negative for immunocompromised state.   Neurological: Negative for facial asymmetry and speech difficulty.   Hematological: Negative for adenopathy. Does not bruise/bleed easily.   Psychiatric/Behavioral: Negative for hallucinations and suicidal ideas.            Patient Care Team:  Claudette Head, MD as PCP - General (Internal Medicine)  Claudette Head, MD as PCP - [facility] Empaneled Provider    Objective:    Blood pressure 130/72, pulse 98, temperature 97 ??F (36.1 ??C), temperature source Temporal, resp. rate 16, height 5\' 10"  (1.778 m), weight 189 lb (85.7 kg), SpO2 96 %.    Physical Exam  Vitals reviewed.   Constitutional:       General: He is not in acute distress.     Appearance: Normal appearance. He is not toxic-appearing.   HENT:      Head: Normocephalic and atraumatic.      Right Ear: Tympanic membrane, ear canal and external ear normal.      Left Ear: Tympanic membrane, ear canal and external ear normal.      Nose: Nose normal.      Mouth/Throat:      Mouth: Mucous membranes are moist.      Pharynx: Oropharynx is clear.   Eyes:      Extraocular Movements: Extraocular movements intact.  Conjunctiva/sclera: Conjunctivae normal.      Pupils: Pupils are equal, round, and reactive to light.   Cardiovascular:      Rate and Rhythm: Normal rate and regular rhythm.      Pulses: Normal pulses.       Heart sounds: Normal heart sounds.   Pulmonary:      Effort: Pulmonary effort is normal.      Breath sounds: Normal breath sounds.   Abdominal:      General: Abdomen is flat. Bowel sounds are normal.      Palpations: Abdomen is soft.   Musculoskeletal:         General: Normal range of motion.      Cervical back: Normal range of motion and neck supple.      Comments: No bony vertebral tenderness.    5/5 motor strength all motor groups both lower extremities.  No objective sensory loss either lower extremity.  DTRs present and symmetric both knees and ankles.     Skin:     General: Skin is warm and dry.      Capillary Refill: Capillary refill takes less than 2 seconds.   Neurological:      General: No focal deficit present.      Mental Status: He is alert and oriented to person, place, and time. Mental status is at baseline.   Psychiatric:         Mood and Affect: Mood normal.         Behavior: Behavior normal.         Thought Content: Thought content normal.         Judgment: Judgment normal.              Results for orders placed or performed in visit on 02/27/20   PSA, DIAGNOSTIC (PROSTATE SPECIFIC AG)   Result Value Ref Range    Prostate Specific Ag 2.5 0.0 - 4.0 ng/mL   CBC WITH AUTOMATED DIFF   Result Value Ref Range    WBC 8.4 3.4 - 10.8 x10E3/uL    RBC 4.61 4.14 - 5.80 x10E6/uL    HGB 14.7 13.0 - 17.7 g/dL    HCT 08.6 76.1 - 95.0 %    MCV 95 79 - 97 fL    MCH 31.9 26.6 - 33.0 pg    MCHC 33.4 31.5 - 35.7 g/dL    RDW 93.2 67.1 - 24.5 %    PLATELET 240 150 - 450 x10E3/uL    NEUTROPHILS 62 Not Estab. %    Lymphocytes 23 Not Estab. %    MONOCYTES 12 Not Estab. %    EOSINOPHILS 2 Not Estab. %    BASOPHILS 1 Not Estab. %    ABS. NEUTROPHILS 5.3 1.4 - 7.0 x10E3/uL    Abs Lymphocytes 1.9 0.7 - 3.1 x10E3/uL    ABS. MONOCYTES 1.0 (H) 0.1 - 0.9 x10E3/uL    ABS. EOSINOPHILS 0.2 0.0 - 0.4 x10E3/uL    ABS. BASOPHILS 0.1 0.0 - 0.2 x10E3/uL    IMMATURE GRANULOCYTES 0 Not Estab. %    ABS. IMM. GRANS. 0.0 0.0 - 0.1 x10E3/uL        Assessent & Plan      ICD-10-CM ICD-9-CM    1. Hemorrhoids, unspecified hemorrhoid type  K64.9 455.6 REFERRAL TO COLON AND RECTAL SURGERY   2. Chronic low back pain with right-sided sciatica, unspecified back pain laterality  M54.41 724.2 methocarbamoL (ROBAXIN) 750 mg tablet    G89.29 724.3 REFERRAL TO PAIN MANAGEMENT  338.29    3. Rectal bleeding  K62.5 569.3 REFERRAL TO COLON AND RECTAL SURGERY   4. Prostate cancer screening  Z12.5 V76.44    5. Gastroesophageal reflux disease, unspecified whether esophagitis present  K21.9 530.81 omeprazole (PRILOSEC) 40 mg capsule       Chronic Conditions Addressed Today     1. Chronic low back pain with right-sided sciatica     Overview      01/24/16 LS spine films - there are six lumbar-type vertebral bodies. Degenerative intervertebral osteophytes at multiple levels in the lumbar spine are unchanged. ??The vertebral body and disc heights are essentially preserved. There is no listhesis, fracture, lytic or blastic lesion or evidence for inflammatory spondyloarthropathy or sacroiliitis. ??Bowel gas pattern is unremarkable.    Patient reports chronic low back pain with intermittent right-sided sciatica.  He has a non focal neurological examination today (no weakness and normal DTRs).  Refer to physical therapy.  Patient also requesting narcotics.  Advised I do not prescribe chronic opiate therapy.  Refer to pain management.            Relevant Medications     methocarbamoL (ROBAXIN) 750 mg tablet     Other Relevant Orders     REFERRAL TO PAIN MANAGEMENT    2. Hemorrhoids - Primary     Overview      Patient reports chronic internal hemorrhoids.  He is contemplating seeing a surgeon for banding or hemorrhoidectomy.  Referral placed.          Relevant Medications     omeprazole (PRILOSEC) 40 mg capsule     Other Relevant Orders     REFERRAL TO COLON AND RECTAL SURGERY    3. Rectal bleeding     Overview      Patient reports chronic intermittent bright red blood per rectum.   States he was previously evaluated by physicians in [place] due to internal hemorrhoids.  02/27/20 CBC normal.  Refer to colorectal surgery as patient would like to discuss treatment options (banding, hemorrhoidectomy, etc).          Relevant Medications     omeprazole (PRILOSEC) 40 mg capsule     Other Relevant Orders     REFERRAL TO COLON AND RECTAL SURGERY    4. Prostate cancer screening     Overview      02/27/20 PSA 2.5         5. GERD (gastroesophageal reflux disease)     Overview      The patient was educated regarding GERD, and instructed regarding therapeutic lifestyle modifications including avoiding spicy or other offending foods as well as caffeine, nicotine, and alcohol.  The patient was advised to avoid eating prior to recumbency and to eat smaller more frequent meals.  Omeprazole 40 mg daily (20 mg dose was not sufficient per patient).            Relevant Medications     omeprazole (PRILOSEC) 40 mg capsule          The patient and/or patient representative voiced understanding and agreement with the current diagnoses, recommendations, and possible side effects.    Follow-up and Dispositions    ?? Return in about 3 months (around 06/06/2020) for follow up chronic medical problems.

## 2020-04-15 NOTE — Telephone Encounter (Signed)
Pt had referral to pain management but per pt they do not accept his insurance   Would like to see if there is another group that does

## 2020-04-16 NOTE — Telephone Encounter (Signed)
Patient has Medicaid of [place].  He was previously followed by pain management in [place].  Advise patient that he might need to follow up with a [place] pain management physician or transition to [place] [facility] [place].

## 2020-04-16 NOTE — Telephone Encounter (Signed)
Left message for Lurena Joiner on personal VM advising of Mr. Buzzetta to follow up with pain management in [place] due to insurance coverage.

## 2020-05-07 ENCOUNTER — Encounter: Payer: MEDICAID | Attending: Internal Medicine | Primary: Internal Medicine

## 2020-06-05 ENCOUNTER — Encounter: Admit: 2020-06-05 | Payer: MEDICAID | Attending: Internal Medicine | Primary: Internal Medicine

## 2020-06-05 ENCOUNTER — Encounter: Attending: Internal Medicine | Primary: Internal Medicine

## 2020-06-10 ENCOUNTER — Ambulatory Visit: Admit: 2020-06-10 | Discharge: 2020-06-10 | Payer: MEDICAID | Attending: Internal Medicine | Primary: Internal Medicine

## 2020-06-10 DIAGNOSIS — G8929 Other chronic pain: Secondary | ICD-10-CM

## 2020-06-10 NOTE — Progress Notes (Signed)
FOLLOWUP VISIT    Subjective:    [name] is a 60 y.o., male,   Chief Complaint   Patient presents with   ??? Follow-up Chronic Condition     8069m f/u cc       HPI:    The patient presents today with several complaints.      Chronic low back pain with right sided sciatica.  Feels that he is getting weak in right leg.  No bowel or bladder symptoms.  The pain management doctor that I referred patient to [facility].  He would like a new referral to a different pain management doctor.  He also wants an MRI and hopes to see a surgeon if MRI confirms nerve impingement.      Also complains of a dry cough and chest congestion for 4 days.  Saw Urgent Care over weekend.  Given cough suppressant and Z-pack.  Did not complete the Z pack.  Still coughing.  No SOB or DOE.  No fever.      Also complains of weight gain.  Asks about thyroid test.      Also concerned about elevated blood pressure reading today.  Normal on previous visits.      The following portions of the patient's history were reviewed and updated as appropriate:      Past Medical History:   Diagnosis Date   ??? BPH (benign prostatic hyperplasia)    ??? Chronic low back pain with right-sided sciatica    ??? History of herpes genitalis    ??? Internal hemorrhoids    ??? Venous insufficiency of both lower extremities        Past Surgical History:   Procedure Laterality Date   ??? CERVICAL FUSION      anterior cervical diskectomy / fusion C4-5, C5-6   ??? HX OPEN REDUCTION INTERNAL FIXATION      femur   ??? SEPTOPLASTY  1983       Family History   Problem Relation Age of Onset   ??? No Known Problems Mother    ??? No Known Problems Father        Social History     Socioeconomic History   ??? Marital status: Single     Spouse name: Not on file   ??? Number of children: Not on file   ??? Years of education: Not on file   ??? Highest education level: Not on file   Occupational History   ??? Not on file   Tobacco Use   ??? Smoking status: Never Smoker   ??? Smokeless tobacco: Never Used    Substance and Sexual Activity   ??? Alcohol use: Not Currently   ??? Drug use: Never   ??? Sexual activity: Not on file   Other Topics Concern   ??? Not on file   Social History Narrative   ??? Not on file     Social Determinants of Health     Financial Resource Strain:    ??? Difficulty of Paying Living Expenses: Not on file   Food Insecurity:    ??? Worried About Running Out of Food in the Last Year: Not on file   ??? Ran Out of Food in the Last Year: Not on file   Transportation Needs:    ??? Lack of Transportation (Medical): Not on file   ??? Lack of Transportation (Non-Medical): Not on file   Physical Activity:    ??? Days of Exercise per Week: Not on  file   ??? Minutes of Exercise per Session: Not on file   Stress:    ??? Feeling of Stress : Not on file   Social Connections:    ??? Frequency of Communication with Friends and Family: Not on file   ??? Frequency of Social Gatherings with Friends and Family: Not on file   ??? Attends Religious Services: Not on file   ??? Active Member of Clubs or Organizations: Not on file   ??? Attends Banker Meetings: Not on file   ??? Marital Status: Not on file   Intimate Partner Violence:    ??? Fear of Current or Ex-Partner: Not on file   ??? Emotionally Abused: Not on file   ??? Physically Abused: Not on file   ??? Sexually Abused: Not on file   Housing Stability:    ??? Unable to Pay for Housing in the Last Year: Not on file   ??? Number of Places Lived in the Last Year: Not on file   ??? Unstable Housing in the Last Year: Not on file       Current Outpatient Medications   Medication Sig Dispense Refill   ??? methocarbamol (ROBAXIN) 750 MG tablet Take 750 mg by mouth 4 times daily     ??? omeprazole (PRILOSEC) 40 MG delayed release capsule Take 40 mg by mouth daily       No current facility-administered medications for this visit.       Allergies as of 06/10/2020 - Fully Reviewed 06/10/2020   Allergen Reaction Noted   ??? Amoxicillin Rash 12/19/2019   ??? Codeine Nausea And Vomiting 12/19/2019       Review of  Systems   Constitutional: Negative for activity change and appetite change.   HENT: Negative for drooling and voice change.    Eyes: Negative for pain.   Respiratory: Negative for choking and stridor.    Gastrointestinal: Negative for rectal pain.   Endocrine: Negative for polydipsia and polyphagia.   Genitourinary: Negative for enuresis and penile pain.   Musculoskeletal: Negative for gait problem and neck stiffness.   Skin: Negative for pallor.   Allergic/Immunologic: Negative for immunocompromised state.   Neurological: Negative for facial asymmetry and speech difficulty.   Hematological: Does not bruise/bleed easily.   Psychiatric/Behavioral: Negative for self-injury. The patient is not hyperactive.             Patient Care Team:  Claudette Head, MD as PCP - General  Claudette Head, MD as PCP - [facility] Empaneled Provider    Objective:    BP (!) 161/91 (Site: Left Upper Arm, Position: Sitting)    Pulse 78    Temp 97.5 ??F (36.4 ??C) (Temporal)    Ht 5\' 10"  (1.778 m)    Wt 197 lb 9.6 oz (89.6 kg)    SpO2 97%    BMI 28.35 kg/m??     Physical Exam  Vitals reviewed.   Constitutional:       General: He is not in acute distress.     Appearance: Normal appearance. He is not toxic-appearing.   HENT:      Head: Normocephalic and atraumatic.      Right Ear: Tympanic membrane, ear canal and external ear normal.      Left Ear: Tympanic membrane, ear canal and external ear normal.      Nose: Nose normal.      Mouth/Throat:      Mouth: Mucous membranes are moist.      Pharynx:  Oropharynx is clear.   Eyes:      General: No scleral icterus.     Extraocular Movements: Extraocular movements intact.      Conjunctiva/sclera: Conjunctivae normal.      Pupils: Pupils are equal, round, and reactive to light.   Cardiovascular:      Rate and Rhythm: Normal rate and regular rhythm.      Pulses: Normal pulses.      Heart sounds: Normal heart sounds.   Pulmonary:      Breath sounds: Normal breath sounds.   Abdominal:      General: Abdomen is flat. Bowel  sounds are normal.      Palpations: Abdomen is soft. There is no mass.      Tenderness: There is no guarding or rebound.   Musculoskeletal:         General: Normal range of motion.      Cervical back: Normal range of motion and neck supple.   Skin:     General: Skin is warm and dry.      Coloration: Skin is not jaundiced.   Neurological:      Mental Status: He is alert and oriented to person, place, and time. Mental status is at baseline.      Comments: he has normal DTR's at both knee and ankles.  He has right lower extremity weakness with right knee flexion and extension.  He can stand on both tip toes.     Psychiatric:         Behavior: Behavior normal.         Thought Content: Thought content normal.              Orders Only on 02/27/2020   Component Date Value Ref Range Status   ??? PSA 02/27/2020 2.5  0.0 - 4.0 ng/mL Final    Comment: Roche ECLIA methodology.  According to the American Urological Association, Serum PSA should  decrease and remain at undetectable levels after radical  prostatectomy. The AUA defines biochemical recurrence as an initial  PSA value 0.2 ng/mL or greater followed by a subsequent confirmatory  PSA value 0.2 ng/mL or greater.  Values obtained with different assay methods or kits cannot be used  interchangeably. Results cannot be interpreted as absolute evidence  of the presence or absence of malignant disease.     ??? WBC 02/27/2020 8.4  3.4 - 10.8 x10E3/uL Final   ??? RBC 02/27/2020 4.61  4.14 - 5.80 x10E6/uL Final   ??? Hemoglobin 02/27/2020 14.7  13.0 - 17.7 g/dL Final   ??? Hematocrit 02/27/2020 44.0  37.5 - 51.0 % Final   ??? MCV 02/27/2020 95  79 - 97 fL Final   ??? MCH 02/27/2020 31.9  26.6 - 33.0 pg Final   ??? MCHC 02/27/2020 33.4  31.5 - 35.7 g/dL Final   ??? RDW 28/36/6294 12.5  11.6 - 15.4 % Final   ??? Platelets 02/27/2020 240  150 - 450 x10E3/uL Final   ??? Neutrophils % 02/27/2020 62  Not Estab. % Final   ??? Lymphocytes % 02/27/2020 23  Not Estab. % Final   ??? Monocytes % 02/27/2020 12  Not  Estab. % Final   ??? Eosinophils % 02/27/2020 2  Not Estab. % Final   ??? Basophils % 02/27/2020 1  Not Estab. % Final   ??? Neutrophils Absolute 02/27/2020 5.3  1.4 - 7.0 x10E3/uL Final   ??? Lymphocytes Absolute 02/27/2020 1.9  0.7 - 3.1 x10E3/uL Final   ???  Monocytes Absolute 02/27/2020 1.0* 0.1 - 0.9 x10E3/uL Final   ??? Eosinophils Absolute 02/27/2020 0.2  0.0 - 0.4 x10E3/uL Final   ??? Basophils Absolute 02/27/2020 0.1  0.0 - 0.2 x10E3/uL Final   ??? Immature Granulocytes 02/27/2020 0  Not Estab. % Final   ??? GRANULOCYTE ABSOLUTE COUNT 02/27/2020 0.0  0.0 - 0.1 x10E3/uL Final         Assessent & Plan:        1. Chronic bilateral low back pain with right-sided sciatica  Overview:  01/24/16 LS spine films - there are six lumbar-type vertebral bodies. Degenerative intervertebral osteophytes at multiple levels in the lumbar spine are unchanged. ??The vertebral body and disc heights are essentially preserved. There is no listhesis, fracture, lytic or blastic lesion or evidence for inflammatory spondyloarthropathy or sacroiliitis. ??Bowel gas   pattern is unremarkable.    Patient reports chronic low back pain with intermittent right-sided sciatica.  On examination today (06/10/20) he has normal DTR's at both knee and ankles.  He has right lower extremity weakness with right knee flexion and extension.  He can stand on both tip toes.  He reports no relief with previous attempted physical therapy while living in [place].  He refuses LESI because a cousin of his had a bad outcome following LESI.  Patient requests MRI of LS spine and surgical referral if indicated.  Also requests referral to pain management.    Orders:  -     [facility] - Baksh, Javid, DO, Pain Management, [place]  -     MRI LUMBAR SPINE WO CONTRAST; Future  2. Gastroesophageal reflux disease, unspecified whether esophagitis present  Overview:  Symptoms improved with omeprazole 40 mg daily (20 mg insufficient).  The patient will continue the current treatment.       3.  Hemorrhoids, unspecified hemorrhoid type  Overview:  Patient reports chronic internal hemorrhoids.  Evaluated by [facility] in 05/2020.  Offered banding which patient declined.    4. Rectal bleeding  Overview:  Patient reports chronic intermittent bright red blood per rectum.  States he was previously evaluated by physicians in determined due to internal hemorrhoids.  02/27/20 CBC normal.  Refer to colorectal surgery as patient would like to discuss treatment options (banding, hemorrhoidectomy, etc).    5. Acute bronchitis due to other specified organisms  Overview:  Patient advised to complete the prescribed Z-pack.  The patient was advised to call for reevaluation should the symptoms fail to improve with treatment.   6. Elevated blood pressure reading  Overview:  Discussed hypertension management with the patient.  We reinforced a low salt diet, alcohol avoidance, exercise, and maintenance of a healthy body weight.  The patient was advised regarding periodic ambulatory blood pressure monitoring, and advised to call for advice if outpatient blood pressure measurements are ever persistently elevated.     Orders:  -     Comprehensive Metabolic Panel; Future  -     Lipid Panel; Future  -     TSH; Future        The patient and/or patient representative voiced understanding and agreement with the current diagnoses, recommendations, and possible side effects.    Return in about 1 month (around 07/10/2020) for follow up of chronic medical problems.

## 2020-06-13 ENCOUNTER — Encounter

## 2020-06-13 NOTE — Telephone Encounter (Signed)
Pt needs something for anxiety for his mri scan.pt, will call back with date of scan

## 2020-06-14 LAB — COMPREHENSIVE METABOLIC PANEL
ALT: 48 U/L (ref 12–65)
AST: 27 U/L (ref 15–37)
Albumin/Globulin Ratio: 1.1 — ABNORMAL LOW (ref 1.2–3.5)
Albumin: 3.7 g/dL (ref 3.2–4.6)
Alk Phosphatase: 149 U/L — ABNORMAL HIGH (ref 50–136)
Anion Gap: 4 mmol/L — ABNORMAL LOW (ref 7–16)
BUN: 12 MG/DL (ref 8–23)
CO2: 29 mmol/L (ref 21–32)
Calcium: 9.6 MG/DL (ref 8.3–10.4)
Chloride: 106 mmol/L (ref 98–107)
Creatinine: 0.8 MG/DL (ref 0.8–1.5)
GFR African American: >60 mL/min/{1.73_m2} (ref >60)
GFR Non-African American: >60 mL/min/{1.73_m2} (ref >60)
Globulin: 3.4 g/dL (ref 2.3–3.5)
Glucose: 98 mg/dL (ref 65–100)
Potassium: 4.3 mmol/L (ref 3.5–5.1)
Sodium: 139 mmol/L (ref 136–145)
Total Bilirubin: 0.7 MG/DL (ref 0.2–1.1)
Total Protein: 7.1 g/dL (ref 6.3–8.2)

## 2020-06-14 LAB — LIPID PANEL
Chol/HDL Ratio: 7.7
Cholesterol, Total: 222 MG/DL — ABNORMAL HIGH (ref <200)
HDL: 29 MG/DL — ABNORMAL LOW (ref 40–60)
LDL Calculated: 150.8 MG/DL — ABNORMAL HIGH (ref <100)
Triglycerides: 211 MG/DL — ABNORMAL HIGH (ref 35–150)
VLDL Cholesterol Calculated: 42.2 MG/DL — ABNORMAL HIGH (ref 6.0–23.0)

## 2020-06-14 LAB — TSH: TSH, 3RD GENERATION: 2.28 u[IU]/mL (ref 0.358–3.740)

## 2020-06-14 NOTE — Other (Signed)
Test results reviewed.  Will discuss with patient next appointment  07/10/20

## 2020-06-20 NOTE — Telephone Encounter (Signed)
Refill on methocarbamol

## 2020-06-23 DIAGNOSIS — B349 Viral infection, unspecified: Secondary | ICD-10-CM

## 2020-06-23 NOTE — ED Provider Notes (Signed)
[facility] Emergency Department Provider Note                   PCP:                Claudette Head, MD               Age: 60 y.o.      Sex: male       ICD-10-CM    1. Viral illness  B34.9        DISPOSITION Decision To Discharge 06/23/2020 11:01:22 PM       New Prescriptions    ONDANSETRON (ZOFRAN) 4 MG TABLET    Take 1 tablet by mouth 3 times daily as needed for Nausea or Vomiting       Orders Placed This Encounter   Procedures   ??? COVID-19, Rapid         Ogle Hoeffner is a 60 y.o. male who presents to the Emergency Department with chief complaint of    Chief Complaint   Patient presents with   ??? Fever   ??? Concern For COVID-19      Patient is a 60 year old male who presents with fever and chills today.  He has not been vaccinated for COVID.  He has had a mild dry cough and some intermittent nausea.  He denies any known sick contacts, has not taken any medicine for his symptoms.  He denies similar symptoms in the past.  He has been eating and drinking normally without difficulty.  He denies any abdominal pain or urinary symptoms.          Review of Systems   Constitutional: Positive for chills and fever.   Respiratory: Negative for cough and shortness of breath.    Gastrointestinal: Negative for abdominal pain, diarrhea and vomiting.   All other systems reviewed and are negative.     All other systems reviewed and are negative.      Past Medical History:   Diagnosis Date   ??? BPH (benign prostatic hyperplasia)    ??? Chronic low back pain with right-sided sciatica    ??? History of herpes genitalis    ??? Internal hemorrhoids    ??? Venous insufficiency of both lower extremities         Past Surgical History:   Procedure Laterality Date   ??? CERVICAL FUSION      anterior cervical diskectomy / fusion C4-5, C5-6   ??? HX OPEN REDUCTION INTERNAL FIXATION      femur   ??? SEPTOPLASTY  1983        Family History   Problem Relation Age of Onset   ??? No Known Problems Mother    ??? No Known Problems Father            Social Connections:    ???  Frequency of Communication with Friends and Family: Not on file   ??? Frequency of Social Gatherings with Friends and Family: Not on file   ??? Attends Religious Services: Not on file   ??? Active Member of Clubs or Organizations: Not on file   ??? Attends Banker Meetings: Not on file   ??? Marital Status: Not on file        Allergies   Allergen Reactions   ??? Amoxicillin Rash   ??? Codeine Nausea And Vomiting        Vitals signs and nursing note reviewed.   Patient Vitals for the past 4 hrs:   Temp  Pulse Resp BP SpO2   06/23/20 2103 99.1 ??F (37.3 ??C) 95 18 (!) 169/99 95 %   06/23/20 2045 -- -- -- (!) 151/95 92 %   06/23/20 2017 98.8 ??F (37.1 ??C) (!) 120 17 (!) 163/95 96 %          Physical Exam  Vitals and nursing note reviewed.   Constitutional:       General: He is not in acute distress.     Appearance: Normal appearance.   HENT:      Head: Normocephalic and atraumatic.   Eyes:      General:         Right eye: No discharge.         Left eye: No discharge.      Conjunctiva/sclera: Conjunctivae normal.   Cardiovascular:      Rate and Rhythm: Regular rhythm. Tachycardia present.   Pulmonary:      Effort: Pulmonary effort is normal. No respiratory distress.      Breath sounds: No wheezing.   Abdominal:      General: There is no distension.      Palpations: Abdomen is soft.      Tenderness: There is no abdominal tenderness.   Musculoskeletal:      Right lower leg: No edema.      Left lower leg: No edema.   Skin:     General: Skin is warm.   Neurological:      Mental Status: He is alert.   Psychiatric:         Mood and Affect: Mood normal.         Behavior: Behavior normal.          MDM  Number of Diagnoses or Management Options  Viral illness: new, no workup  Diagnosis management comments: 11:04 PM discussed results with patient, negative COVID swab       Amount and/or Complexity of Data Reviewed  Clinical lab tests: ordered and reviewed    Risk of Complications, Morbidity, and/or Mortality  Presenting problems:  moderate  Diagnostic procedures: low  Management options: low    Patient Progress  Patient progress: stable      Procedures    Labs Reviewed   COVID-19, RAPID        No orders to display            Glasgow Coma Scale  Eye Opening: Spontaneous  Best Verbal Response: Oriented  Best Motor Response: Obeys commands  Glasgow Coma Scale Score: 15                     Voice dictation software was used during the making of this note.  This software is not perfect and grammatical and other typographical errors may be present.  This note has not been completely proofread for errors.        Renea Ee, MD  06/23/20 [phone]

## 2020-06-23 NOTE — Discharge Instructions (Signed)
Take Motrin 600 mg every 6 hours and/or Tylenol 1000 mg every 8 hours as needed for fevers.  Return to the emergency department if your symptoms worsen.

## 2020-06-23 NOTE — ED Notes (Signed)
I have reviewed discharge instructions with the patient.  The patient verbalized understanding.    Patient left ED via Discharge Method: ambulatory to Home with self.    Opportunity for questions and clarification provided.       Patient given 1 scripts.         To continue your aftercare when you leave the hospital, you may receive an automated call from our care team to check in on how you are doing.  This is a free service and part of our promise to provide the best care and service to meet your aftercare needs.??? If you have questions, or wish to unsubscribe from this service please call [phone].  Thank you for Choosing our [facility] Emergency Department.        Walburga Hudman L. Reginia Naas, RN  06/23/20 [id number]

## 2020-06-23 NOTE — ED Triage Notes (Signed)
Pt states he has a fever at home of 100.8. Temp during triage is 98.8 oral. Pt states he is concerned of having Covid 19

## 2020-06-24 ENCOUNTER — Inpatient Hospital Stay
Admit: 2020-06-24 | Discharge: 2020-06-24 | Disposition: A | Discharge status: Home / Self Care | Payer: MEDICAID | Attending: Emergency Medicine

## 2020-06-24 LAB — COVID-19, RAPID: SARS-CoV-2, Rapid: NOT DETECTED

## 2020-06-24 MED ORDER — ONDANSETRON HCL 4 MG PO TABS
4 MG | ORAL_TABLET | Freq: Three times a day (TID) | ORAL | 2 refills | Status: AC | PRN
Start: 2020-06-24 — End: 2021-12-13

## 2020-06-24 MED ORDER — METHOCARBAMOL 750 MG PO TABS
750 MG | ORAL_TABLET | Freq: Four times a day (QID) | ORAL | 2 refills | Status: AC | PRN
Start: 2020-06-24 — End: 2020-09-22

## 2020-07-04 ENCOUNTER — Encounter: Payer: MEDICAID | Attending: Internal Medicine | Primary: Internal Medicine

## 2020-07-10 ENCOUNTER — Encounter: Payer: MEDICAID | Attending: Internal Medicine | Primary: Internal Medicine

## 2020-07-11 ENCOUNTER — Ambulatory Visit: Admit: 2020-07-11 | Discharge: 2020-07-11 | Payer: MEDICAID | Attending: Internal Medicine | Primary: Internal Medicine

## 2020-07-11 DIAGNOSIS — K219 Gastro-esophageal reflux disease without esophagitis: Secondary | ICD-10-CM

## 2020-07-11 NOTE — Progress Notes (Signed)
FOLLOWUP VISIT    Subjective:    Jeffrey Wright is a 60 y.o., male,   Chief Complaint   Patient presents with   ??? Follow-up Chronic Condition     BP       HPI:    Patient presents today for follow up of two or more chronic medical problems and review of labs.       He may be developing hypertension.  BP was elevated last office visit.  Elevated again today despite non-pharmacologic therapy, salt restriction, etc...     Weight gain stable since last visit.  TSH normal.     Also discussed his newly diagnosed mixed hyperlipidemia (see below).  Diet could be better.      Continues to suffer chronic low back pain with right sided sciatica.  Feels that he is getting weak in right leg.  No bowel or bladder symptoms.  I ordered an MRI last appointment.  Patient declined to schedule an MRI unless he was "completely knocked out" for the MRI due to claustrophobia.  Patient states that an oral benzodiazepine would not suffice and he wants to be put under anesthesia.      The following portions of the patient's history were reviewed and updated as appropriate:      Past Medical History:   Diagnosis Date   ??? BPH (benign prostatic hyperplasia)    ??? Chronic low back pain with right-sided sciatica    ??? History of herpes genitalis    ??? Internal hemorrhoids    ??? Venous insufficiency of both lower extremities        Past Surgical History:   Procedure Laterality Date   ??? CERVICAL FUSION      anterior cervical diskectomy / fusion C4-5, C5-6   ??? HX OPEN REDUCTION INTERNAL FIXATION      femur   ??? SEPTOPLASTY  1983       Family History   Problem Relation Age of Onset   ??? No Known Problems Mother    ??? No Known Problems Father        Social History     Socioeconomic History   ??? Marital status: Single     Spouse name: Not on file   ??? Number of children: Not on file   ??? Years of education: Not on file   ??? Highest education level: Not on file   Occupational History   ??? Not on file   Tobacco Use   ??? Smoking status: Never Smoker   ??? Smokeless tobacco:  Never Used   Substance and Sexual Activity   ??? Alcohol use: Not Currently   ??? Drug use: Never   ??? Sexual activity: Not on file   Other Topics Concern   ??? Not on file   Social History Narrative   ??? Not on file     Social Determinants of Health     Financial Resource Strain:    ??? Difficulty of Paying Living Expenses: Not on file   Food Insecurity:    ??? Worried About Running Out of Food in the Last Year: Not on file   ??? Ran Out of Food in the Last Year: Not on file   Transportation Needs:    ??? Lack of Transportation (Medical): Not on file   ??? Lack of Transportation (Non-Medical): Not on file   Physical Activity:    ??? Days of Exercise per Week: Not on file   ??? Minutes of Exercise per Session: Not on  file   Stress:    ??? Feeling of Stress : Not on file   Social Connections:    ??? Frequency of Communication with Friends and Family: Not on file   ??? Frequency of Social Gatherings with Friends and Family: Not on file   ??? Attends Religious Services: Not on file   ??? Active Member of Clubs or Organizations: Not on file   ??? Attends Banker Meetings: Not on file   ??? Marital Status: Not on file   Intimate Partner Violence:    ??? Fear of Current or Ex-Partner: Not on file   ??? Emotionally Abused: Not on file   ??? Physically Abused: Not on file   ??? Sexually Abused: Not on file   Housing Stability:    ??? Unable to Pay for Housing in the Last Year: Not on file   ??? Number of Places Lived in the Last Year: Not on file   ??? Unstable Housing in the Last Year: Not on file       Current Outpatient Medications   Medication Sig Dispense Refill   ??? methocarbamol (ROBAXIN) 750 MG tablet Take 1 tablet by mouth 4 times daily as needed (back pain90) 120 tablet 2   ??? ondansetron (ZOFRAN) 4 MG tablet Take 1 tablet by mouth 3 times daily as needed for Nausea or Vomiting 15 tablet 2     No current facility-administered medications for this visit.       Allergies as of 07/11/2020 - Fully Reviewed 07/11/2020   Allergen Reaction Noted   ???  Amoxicillin Rash 12/19/2019   ??? Codeine Nausea And Vomiting 12/19/2019       Review of Systems   Constitutional: Negative for activity change and appetite change.   HENT: Negative for drooling and voice change.    Eyes: Negative for pain.   Respiratory: Negative for choking and stridor.    Gastrointestinal: Negative for rectal pain.   Endocrine: Negative for polydipsia and polyphagia.   Genitourinary: Negative for enuresis and penile pain.   Musculoskeletal: Negative for gait problem and neck stiffness.   Skin: Negative for pallor.   Allergic/Immunologic: Negative for immunocompromised state.   Neurological: Negative for facial asymmetry and speech difficulty.   Hematological: Does not bruise/bleed easily.   Psychiatric/Behavioral: Negative for self-injury. The patient is not hyperactive.             Patient Care Team:  Claudette Head, MD as PCP - General  Claudette Head, MD as PCP - [facility] Empaneled Provider    Objective:    BP (!) 151/90 (Site: Left Upper Arm, Position: Sitting)    Pulse 94    Temp 98.2 ??F (36.8 ??C) (Temporal)    Ht 5\' 9"  (1.753 m)    Wt 198 lb (89.8 kg)    SpO2 97%    BMI 29.24 kg/m??     Physical Exam  Vitals reviewed.   Constitutional:       General: He is not in acute distress.     Appearance: Normal appearance. He is not toxic-appearing.   HENT:      Head: Normocephalic and atraumatic.      Right Ear: Tympanic membrane, ear canal and external ear normal.      Left Ear: Tympanic membrane, ear canal and external ear normal.      Nose: Nose normal.      Mouth/Throat:      Mouth: Mucous membranes are moist.      Pharynx: Oropharynx  is clear.   Eyes:      General: No scleral icterus.     Extraocular Movements: Extraocular movements intact.      Conjunctiva/sclera: Conjunctivae normal.      Pupils: Pupils are equal, round, and reactive to light.   Cardiovascular:      Rate and Rhythm: Normal rate and regular rhythm.      Pulses: Normal pulses.      Heart sounds: Normal heart sounds.   Pulmonary:      Breath  sounds: Normal breath sounds.   Abdominal:      General: Abdomen is flat. Bowel sounds are normal.      Palpations: Abdomen is soft. There is no mass.      Tenderness: There is no guarding or rebound.   Musculoskeletal:         General: Normal range of motion.      Cervical back: Normal range of motion and neck supple.   Skin:     General: Skin is warm and dry.      Coloration: Skin is not jaundiced.   Neurological:      Mental Status: He is alert and oriented to person, place, and time. Mental status is at baseline.      Comments: he has normal DTR's at both knee and ankles.  He has mild right lower extremity weakness with right knee flexion and extension.  He can stand on both tip toes.    Psychiatric:         Behavior: Behavior normal.         Thought Content: Thought content normal.              Admission on 06/23/2020, Discharged on 06/23/2020   Component Date Value Ref Range Status   ??? Source 06/23/2020 Nasopharyngeal    Final   ??? SARS-CoV-2, Rapid 06/23/2020 Not detected  NOTD   Final         Assessent & Plan:        1. Gastroesophageal reflux disease, unspecified whether esophagitis present  Overview:  Symptoms improved with omeprazole 40 mg daily (20 mg insufficient).  The patient will continue the current treatment.       2. Hypertension, essential  Overview:  Discussed hypertension management with the patient.  We reinforced a low salt diet, alcohol avoidance, exercise, and maintenance of a healthy body weight.  The patient was advised regarding periodic ambulatory blood pressure monitoring, and advised to call for advice if outpatient blood pressure measurements are ever persistently elevated.   I recommended antihypertensive therapy which he declines at this time.   3. Chronic bilateral low back pain with right-sided sciatica  Overview:  01/24/16 LS spine films - there are six lumbar-type vertebral bodies. Degenerative intervertebral osteophytes at multiple levels in the lumbar spine are unchanged. ??The  vertebral body and disc heights are essentially preserved. There is no listhesis, fracture, lytic or blastic lesion or evidence for inflammatory spondyloarthropathy or sacroiliitis. ??Bowel gas   pattern is unremarkable.    Patient reports chronic low back pain with intermittent right-sided sciatica.  On examination (06/10/20) he has normal DTR's at both knee and ankles.  He has right lower extremity weakness with right knee flexion and extension.  He can stand on both tip toes.  He reports no relief with previous attempted physical therapy [place]  He refuses LESI because a cousin of his had a bad outcome following LESI.  Patient requested MRI of LS  spine and surgical referral if indicated.  Also requested referral to pain management.      On 07/11/20 patient states he cannot have an MRI unless he is placed under conscious sedation due to severe claustrophobia.  Will call radiology to see if this can practically be arranged.    4. Overweight (BMI 25.0-29.9)  Overview:  06/13/20 TSH 2.280.  Reviewed the patient's BMI.  Discussed the need to lose weight in order to avoid many preventable illnesses.  Discussed diet, exercise, and weight loss strategies.     5. Mixed hyperlipidemia  Overview:  06/13/20 total 222 HDL 29 LDL 151 TG 211 on ad lib diet.  Labs were reviewed and discussed with patient.   Gave handout regarding low cholesterol low triglyceride diet.  He is medication apprehensive and would prefer to avoid if possible.    Orders:  -     Lipid Panel; Future        The patient and/or patient representative voiced understanding and agreement with the current diagnoses, recommendations, and possible side effects.    Return in about 3 months (around 10/11/2020) for follow up of chronic medical problems, review labs.

## 2020-08-08 NOTE — Telephone Encounter (Signed)
-----   Message from Ames Dura sent at 08/08/2020  3:33 PM EDT -----  Subject: Message to Provider    QUESTIONS  Information for Provider? Patient is calling because he had an appointment   1 month ago. He has been waiting for a call back to discuss an MRI with   sedation. He is wanting a call back 8/5 after 3pm   ---------------------------------------------------------------------------  --------------  Cleotis Lema INFO  [phone]; OK to leave message on voicemail  ---------------------------------------------------------------------------  --------------  SCRIPT ANSWERS  Relationship to Patient? Self

## 2020-08-09 ENCOUNTER — Encounter

## 2020-08-09 NOTE — Telephone Encounter (Signed)
Patient notified.

## 2020-08-09 NOTE — Telephone Encounter (Signed)
MRI ordered with sedation.

## 2020-08-17 IMAGING — CR DG THORACIC SPINE 2V
1 series · 3 of 3 positions shown · non-contrast
Comparison: None.

CLINICAL DATA: Back pain

EXAM:
THORACIC SPINE 2 VIEWS

[Series 1: dg thoracic spine 2 view · 0.14mm/px · 3 of 3 slices shown]
[im 1/3]
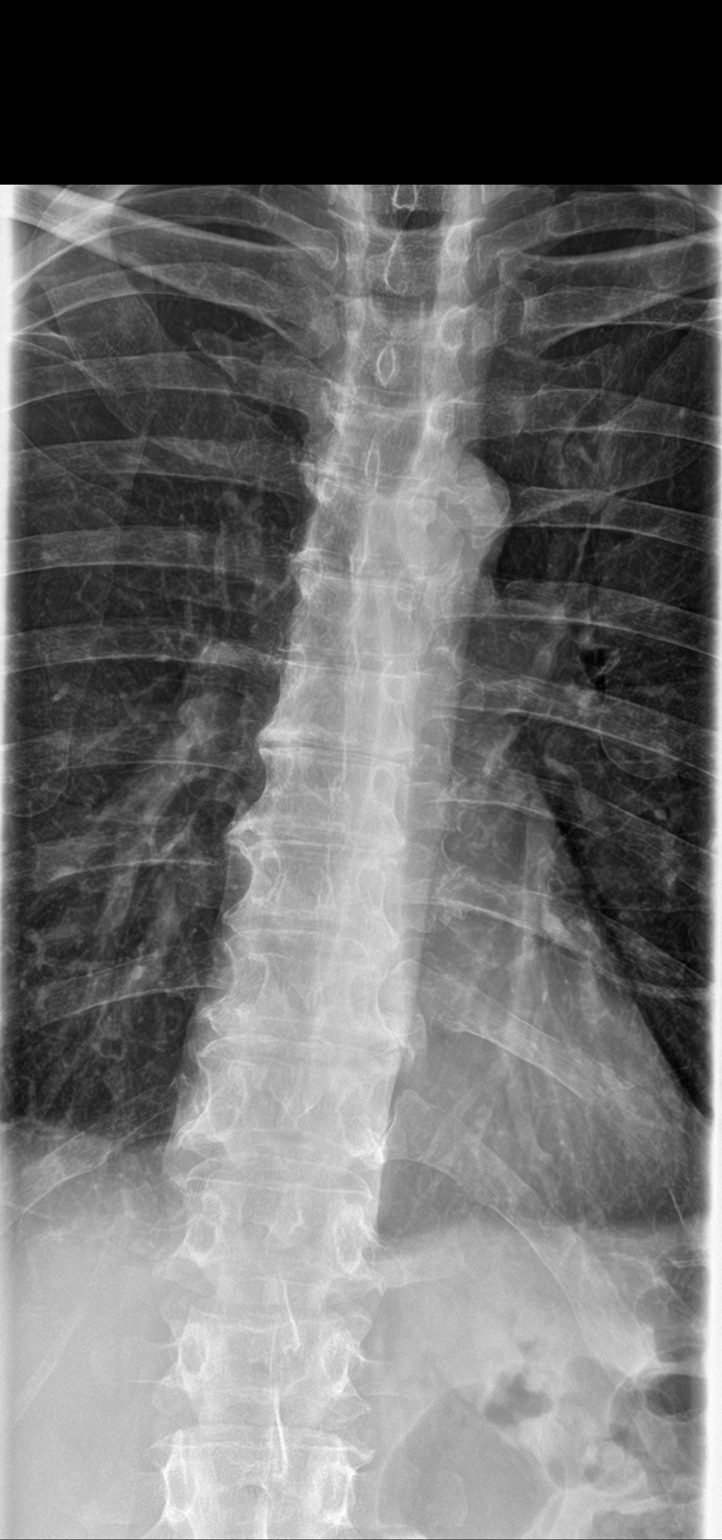
[im 2/3]
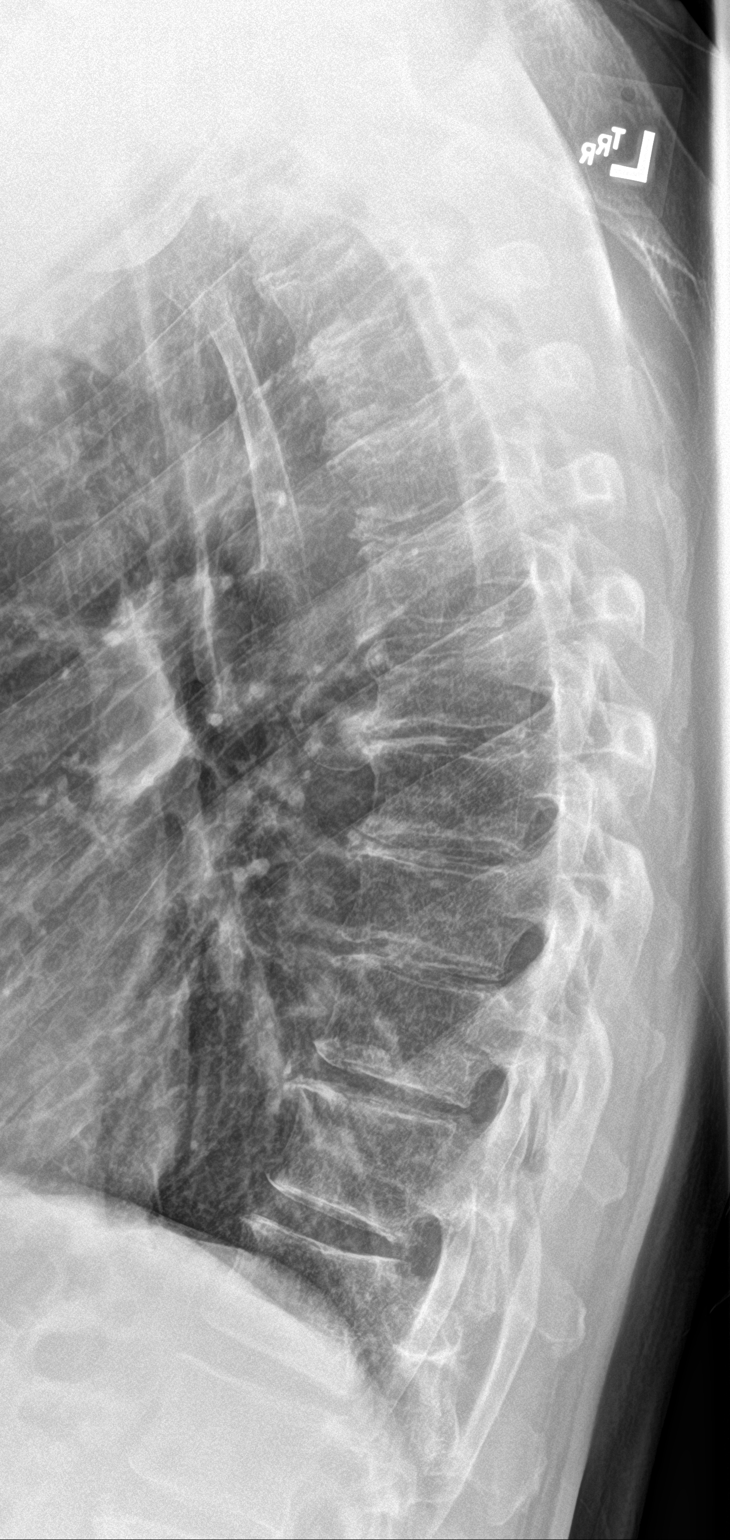
[im 3/3]
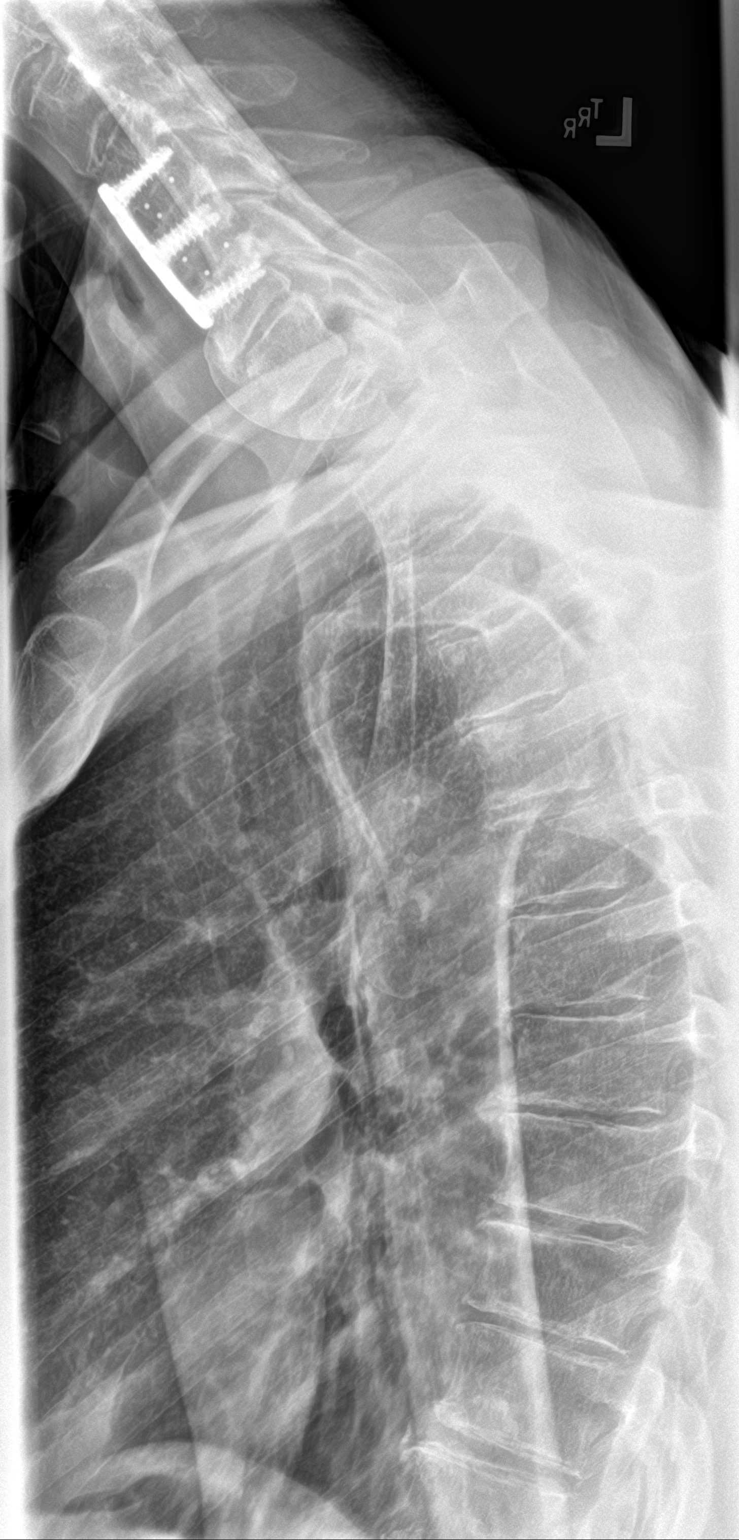

[3 of 3 positions shown; findings below may reference images not displayed]

FINDINGS: There is no evidence of thoracic spine fracture. Alignment is
normal. No other significant bone abnormalities are identified.
IMPRESSION: Negative.

## 2020-08-20 NOTE — Telephone Encounter (Signed)
Pt's sig other Becky reports pt seen by Pain Mgnt Dr Candie Echevaria who rec Open MRI at [address] [facility])  for Lumbar radiculopathy instead of sedation, which pt agreed he could tolerate  Faxed 08/09/20 order to [facility] [phone] noting to schedule OPEN MRI w/ pt

## 2020-08-23 ENCOUNTER — Encounter

## 2020-08-23 MED ORDER — ALPRAZOLAM 1 MG PO TABS
1 MG | ORAL_TABLET | ORAL | 0 refills | Status: AC
Start: 2020-08-23 — End: 2020-11-23

## 2020-08-23 NOTE — Telephone Encounter (Signed)
Pt's sig other Becky rpt has been scheduled for open MRI on 09/04/20 which pt states he can handle but still may need Xanax to take prior to MRI.   She requests new Rx Xanax sent to [facility] [id number] on file

## 2020-08-23 NOTE — Telephone Encounter (Signed)
Attempted to contact Jeffrey Wright - lvmm new Rx for Alprazolam sent to [facility]

## 2020-08-23 NOTE — Telephone Encounter (Signed)
Sent Rx for alprazolam to [facility].

## 2020-08-27 NOTE — Progress Notes (Signed)
Formatting of this note is different from the original.  Images from the original note were not included.     Clinic Note  Colorectal Surgery 08/27/2020     Jeffrey Wright   DOB:September 08, 1960  DQQ:[id number]      60 yo male with pmh of rectal prolapse s/p rectopexy with mesh in 34 and internal hemrohoids who presents for blood with his stool    Patient wishes to again forego the procedure   He will follow up at his leisure for re-evaluation and banding if indicated       CC: Hemorrhoids      Patient returns to clinic today to again inquire about banding. Since being seen last in May he reports that his bleeding has been less with just an "occasional pin-point" of blood on the toilet paper. He takes a laxative daily. He still is not ready for banding as he states he is sore. He did not let me examine him. I discussed internal and external hemorrhoids and the banding procedure and showed him the rubber band applicator and how it worked. Patient reported that he would make a 3rd clinic appointment in the near future to again discuss and maybe undergo banding. Of note I also assuaged the patient's fears that the banding would make him light-headed and assured him it would not- he had been told this by a nurse at [facility] which the patient informed me was 40 miles from his hometown of [place], [place]. He was supposed to undergo banding there however he did not have a car and did not want to walk. His brother- who is an Engineer, maintenance (IT) in the [employer] was busy that day and could not take him. I again informed the patient that we were available when he was ready for re-evaluation and possible banding if indicated.      Review of Systems:  All systems were reviewed and are negative, except as noted in the HPI    Past Medical History:   Diagnosis Date    Back pain     Hemorrhoids     Past Surgical History:   Procedure Laterality Date    CERVICAL FUSION      ORTHOPEDIC SURGERY Left 01/05/2000    RECTAL  PROLAPSE REPAIR  04/30/1995     No family history on file. Social History     Tobacco Use    Smoking status: Never    Smokeless tobacco: Current    Tobacco comments:     dips   Substance Use Topics    Alcohol use: Not Currently    Drug use: Not Currently       Allergies:    Codeine and Amoxicillin    Home Medications 08/27/20 1541    Sig   docusate sodium (COLACE) 100 mg capsule Take Indications: constipation   famotidine (PEPCID) 20 mg tablet 20 mg by Oral/Gastric Tube route daily   methocarbamoL (ROBAXIN) 750 mg tablet Take 1 tablet (750 mg) by mouth 2 (two) times a day as needed for muscle spasms   omeprazole (PriLOSEC) 20 MG capsule Take 20 mg by mouth daily   pantoprazole (PROTONIX) 40 mg tablet Take 40 mg by mouth   traMADoL (ULTRAM) 50 mg tablet Take by mouth   orphenadrine (NORFLEX) 100 mg tablet Take 1 tablet (100 mg) by mouth 2 (two) times a day as needed for muscle spasms       Temperature:   @TEMPRANGE @   Heart Rate:   @PULSERANGE @  Resp Rate:   @RESPRANGE @   Blood Pressure:   @BPRANGE @   Oxygen Sats:   @O2RANGE @   Current      The above vital signs obtained by nursing staff were reviewed.     Physical Exam:  CONST No acute distress    NEURO alert & oriented x3   EYES No gross abnormalities., PERRL, EOMI, and Sclera nonicteric   ENT Hearing intact, Trachea midline   CV Regular rate and rhythm   RESP normal work of breathing , no stridor    GI Soft, Non distended   SKIN Warm, Dry     No results for input(s): WBC, BANDS, HGB, HCT, PLT, INR, PTT in the last 72 hours.     No results for input(s): NA, K, CL, CO2, BUN, CREATININE, CA, GLUCOSE, CALCIUM, MG, PHOS, BILITOT, AST, ALT, ALKPHOS, LIPASE in the last 72 hours.     Imaging:  , MD 08/27/2020 4:00 PM  Resident, Department of Surgery      Electronically signed by , MD at 08/27/2020  4:26 PM EDT

## 2020-08-27 NOTE — Progress Notes (Signed)
Formatting of this note is different from the original.  I was available in the clinic at the time care was rendered. I agree with the resident?s documentation and plan of care.        Electronically signed by Rich Brave, MD at 08/29/2020  9:55 AM EDT

## 2020-09-17 IMAGING — CR DG TIBIA/FIBULA 2V*R*
3 series · 3 of 3 positions shown · non-contrast
Comparison: None.

CLINICAL DATA: Initial evaluation for acute trauma, fall.

EXAM:
RIGHT TIBIA AND FIBULA - 2 VIEW

[tibia ap]
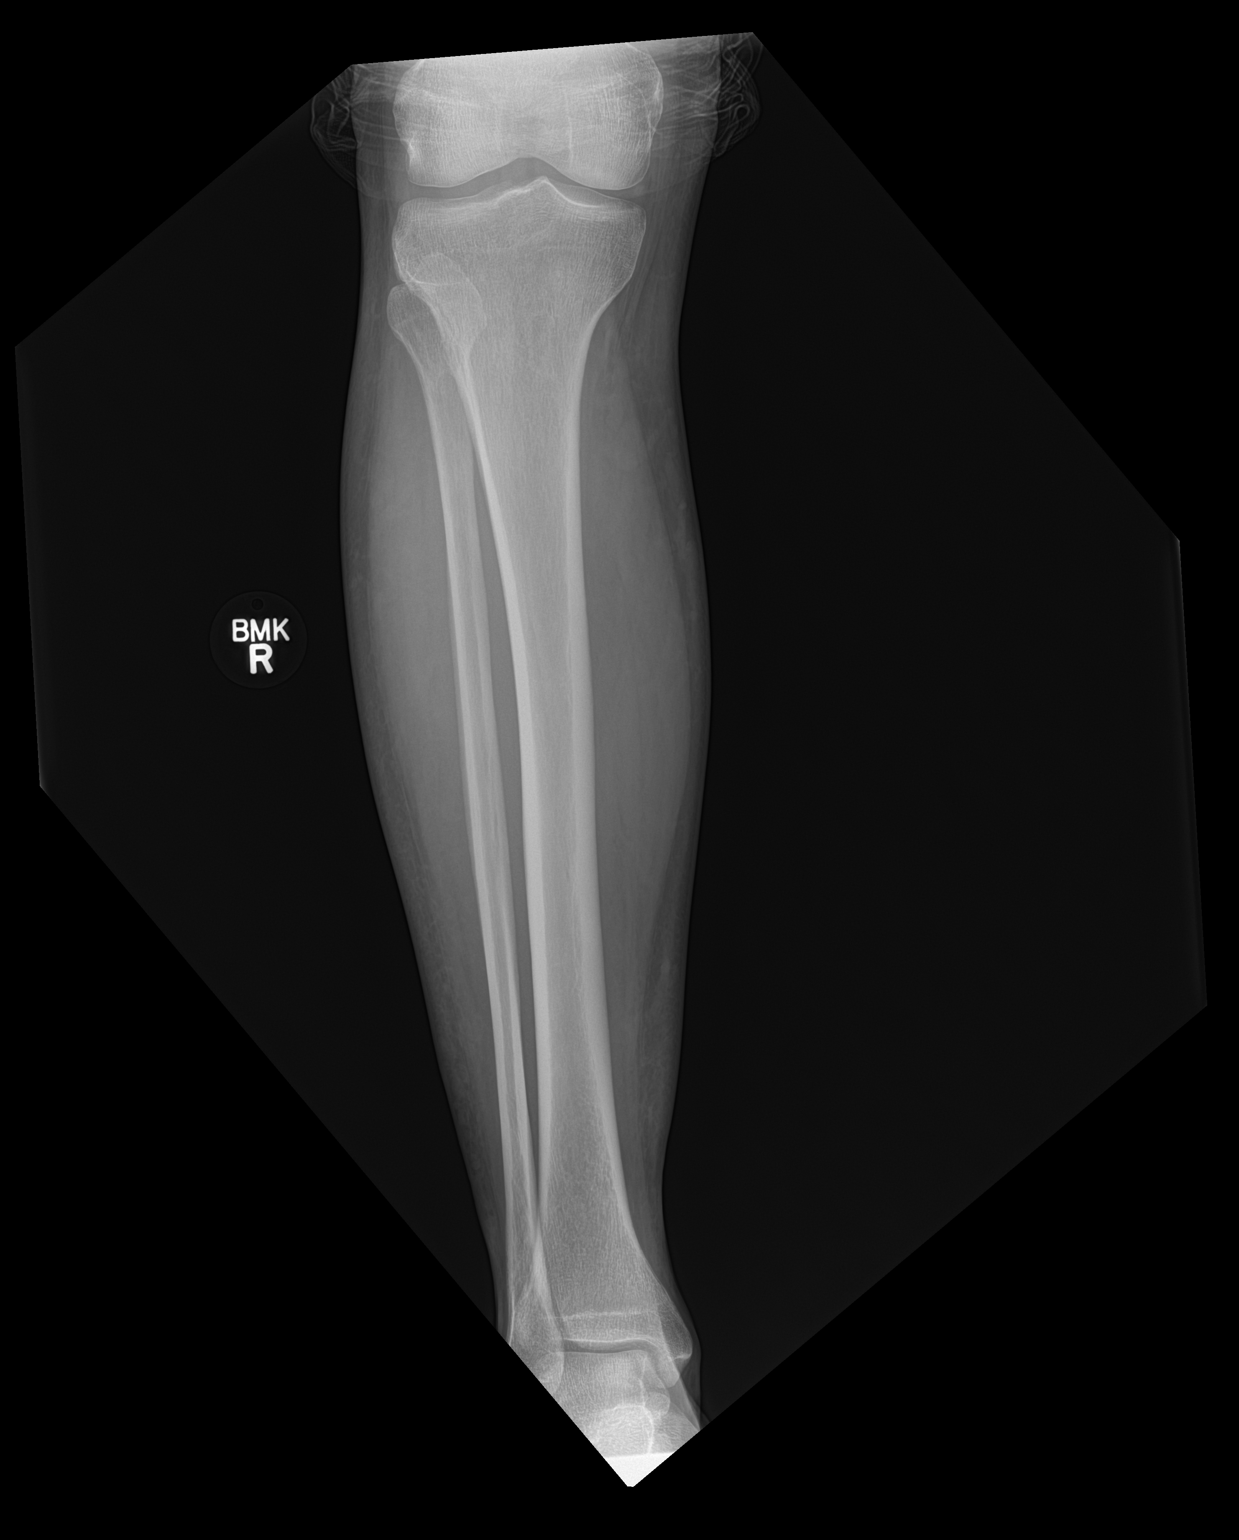

[tibia lat (1 of 2)]
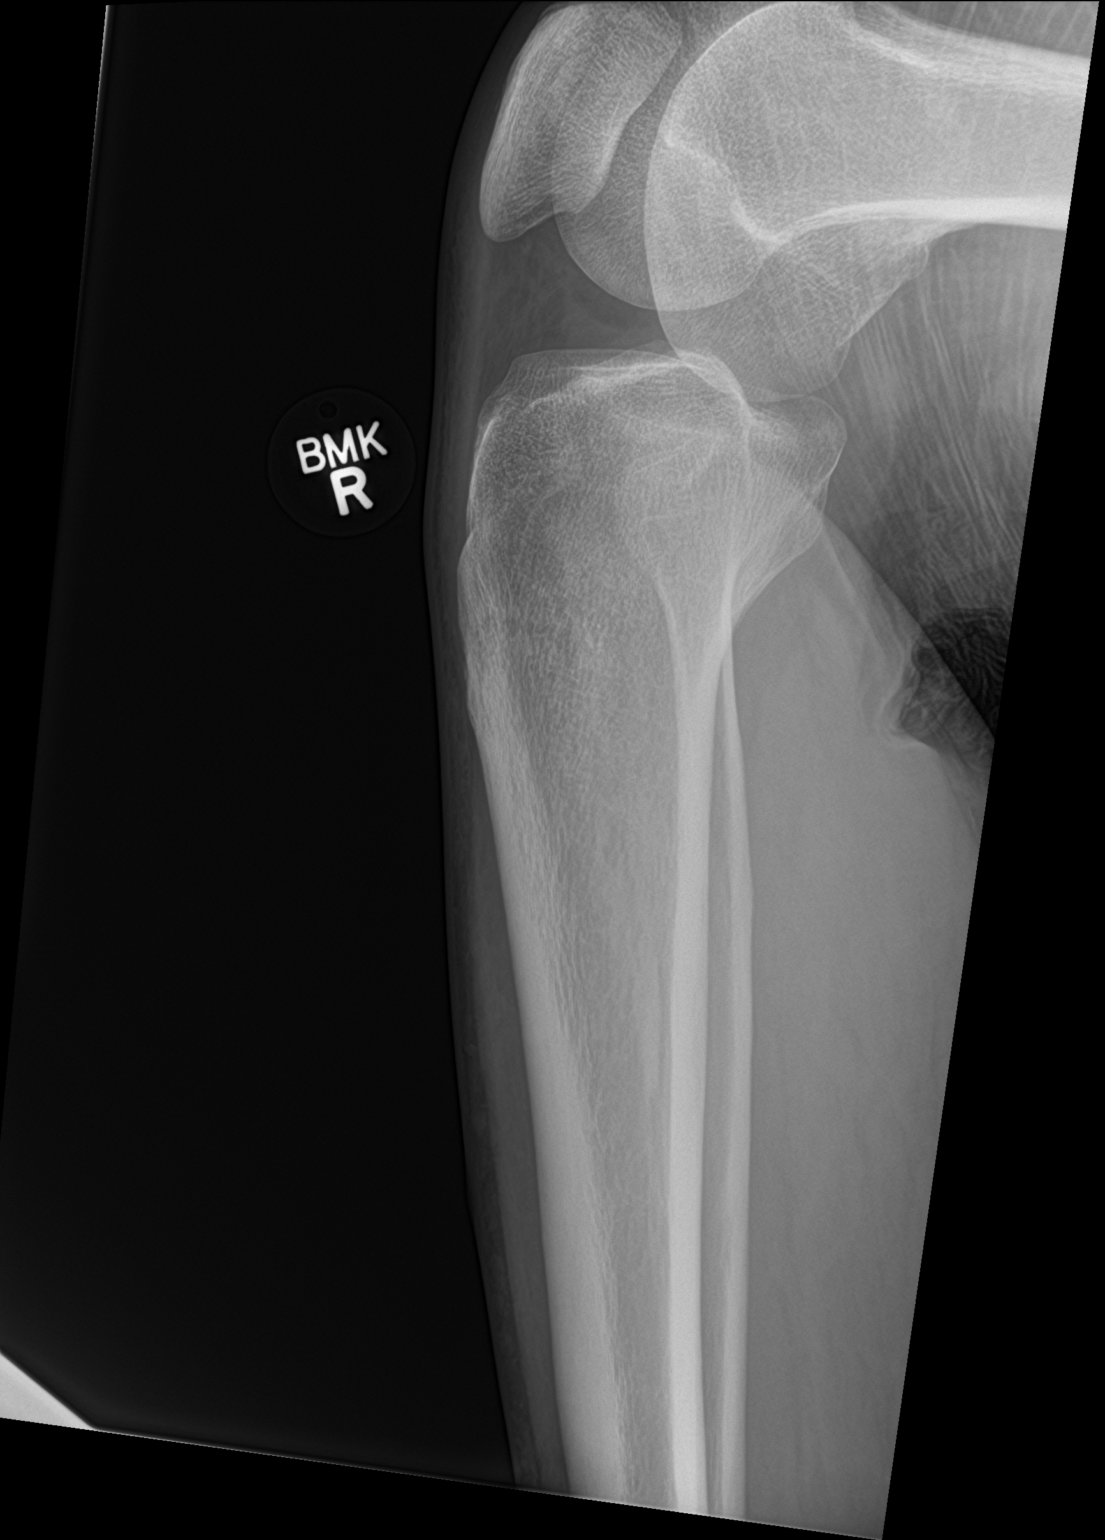

[tibia lat (2 of 2)]
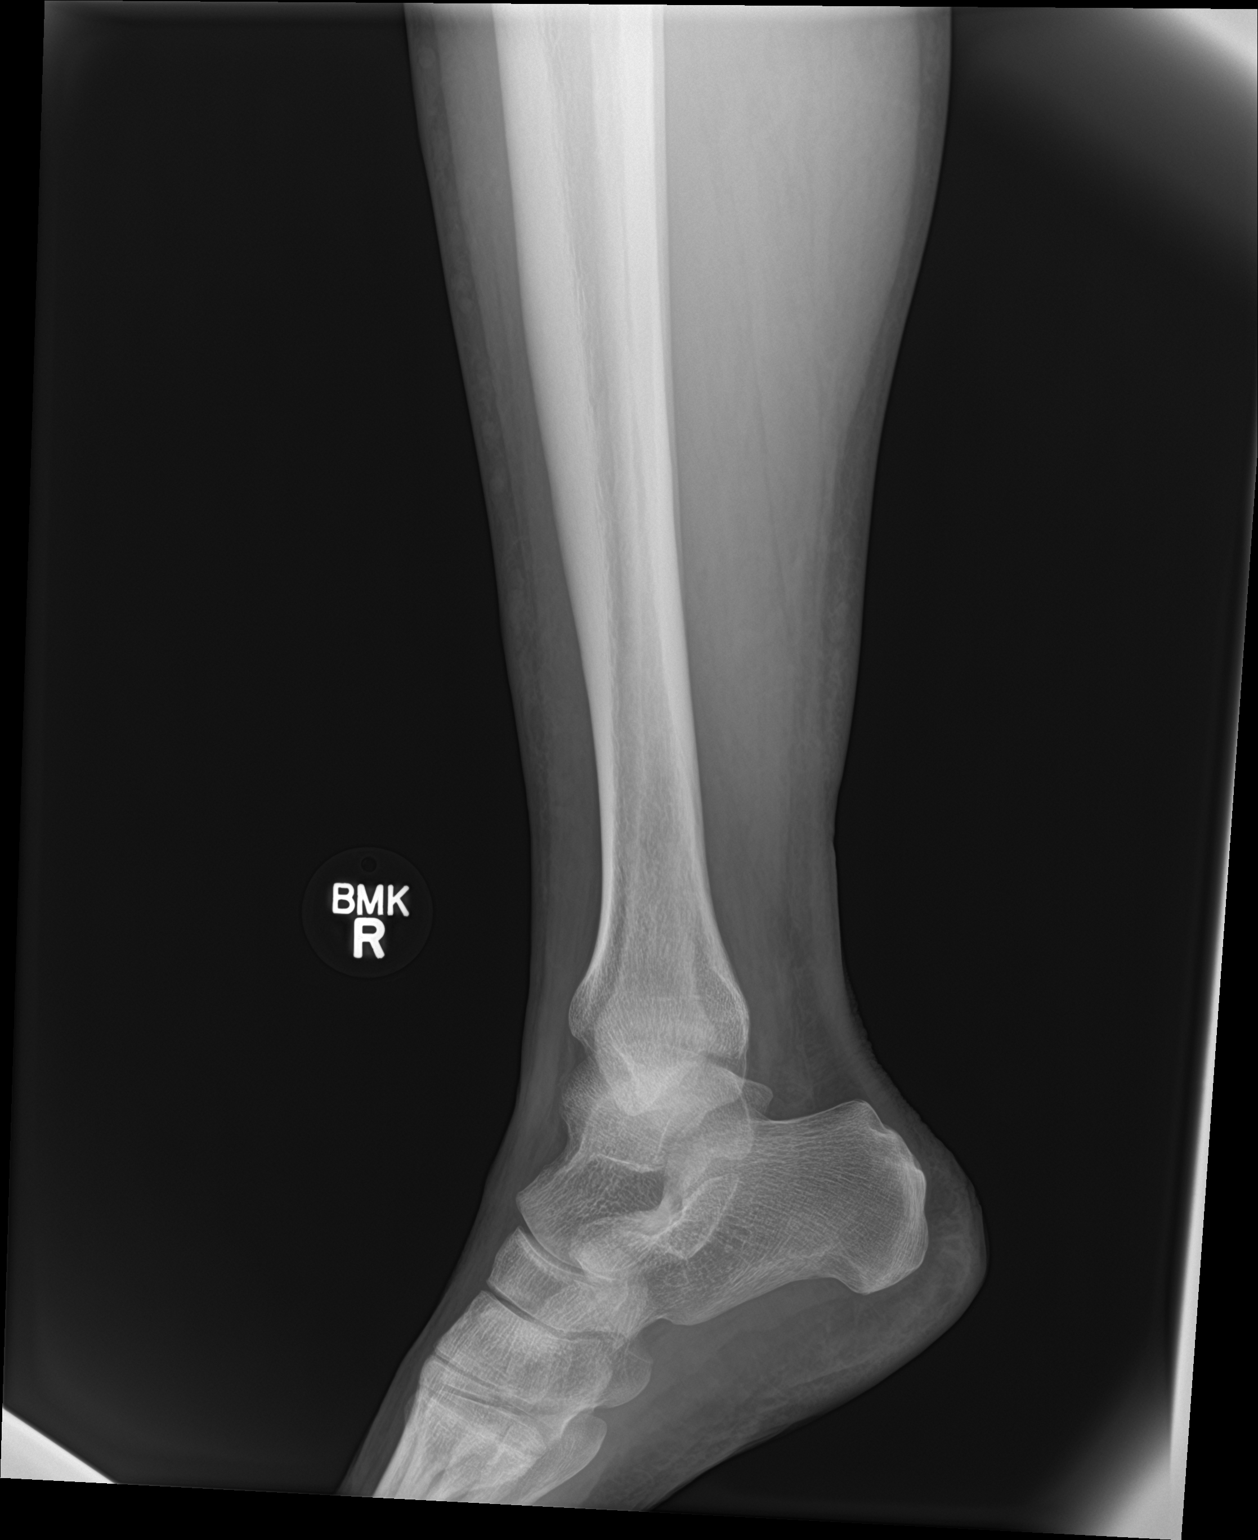

[3 of 3 positions shown; findings below may reference images not displayed]

FINDINGS: There is no evidence of fracture or other focal bone lesions. Soft
tissues are unremarkable.
IMPRESSION: No acute osseous abnormality about the right tibia/fibula.

## 2020-09-26 IMAGING — CR DG ANKLE COMPLETE 3+V*R*
1 series · 3 of 3 positions shown · non-contrast
Comparison: 11/12/2018

CLINICAL DATA: Fall, pain, swelling

EXAM:
RIGHT ANKLE - COMPLETE 3+ VIEW

[Series 1: x ankle ap right · 0.14mm/px · 3 of 3 slices shown]
[im 1/3]
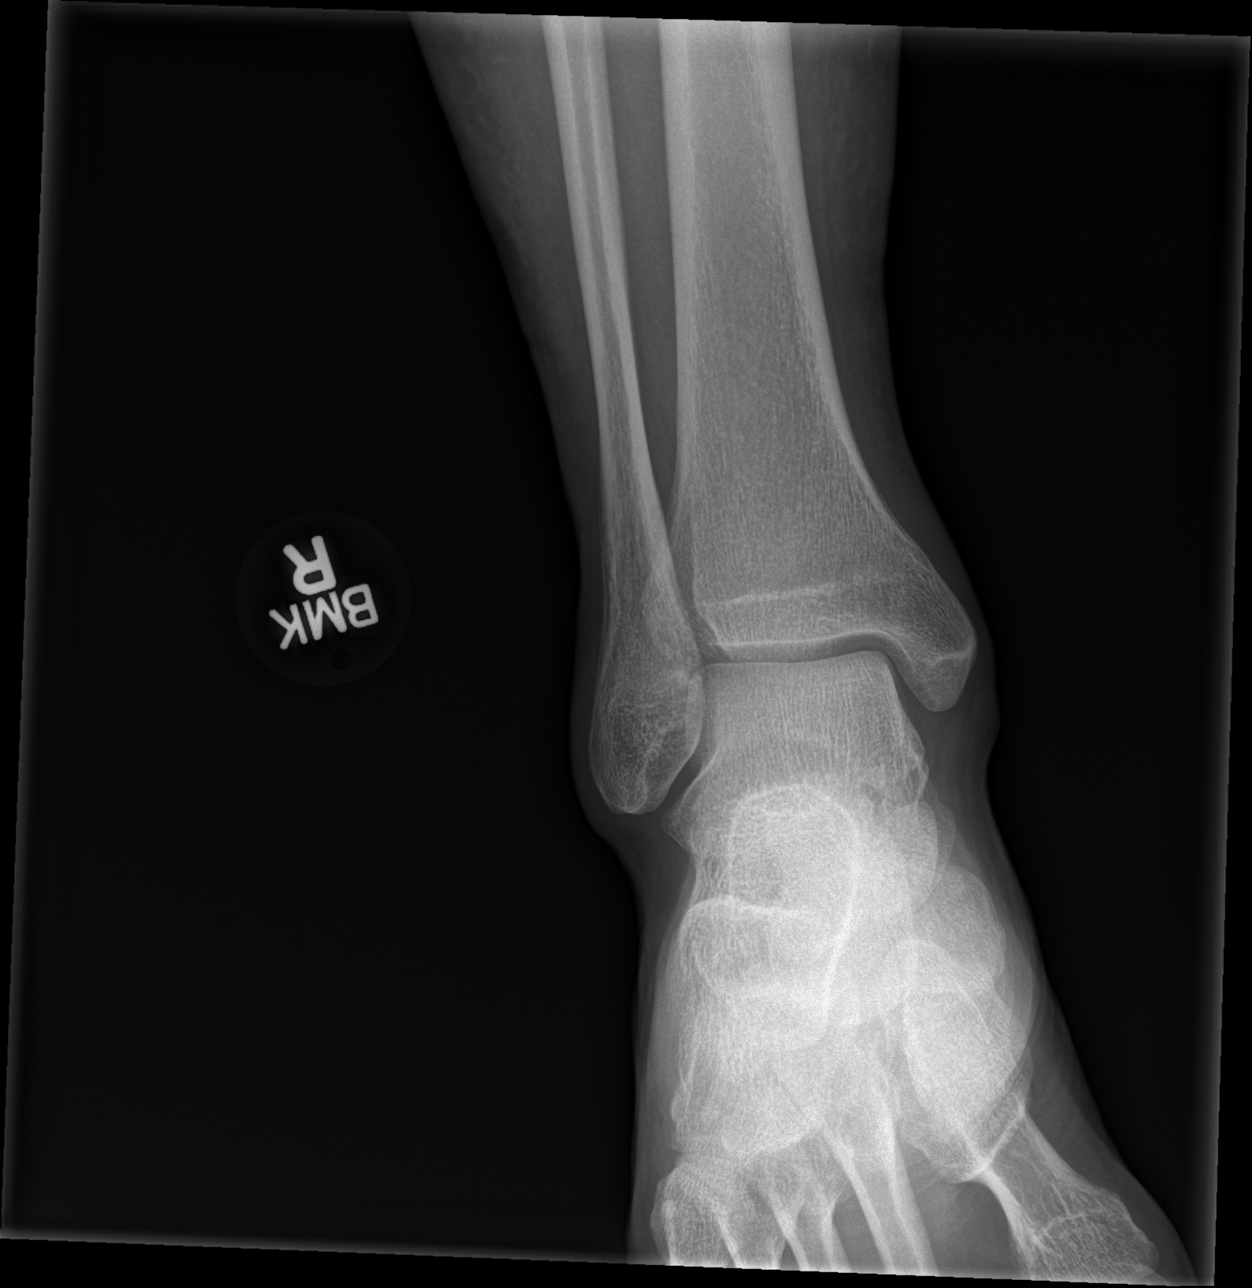
[im 2/3]
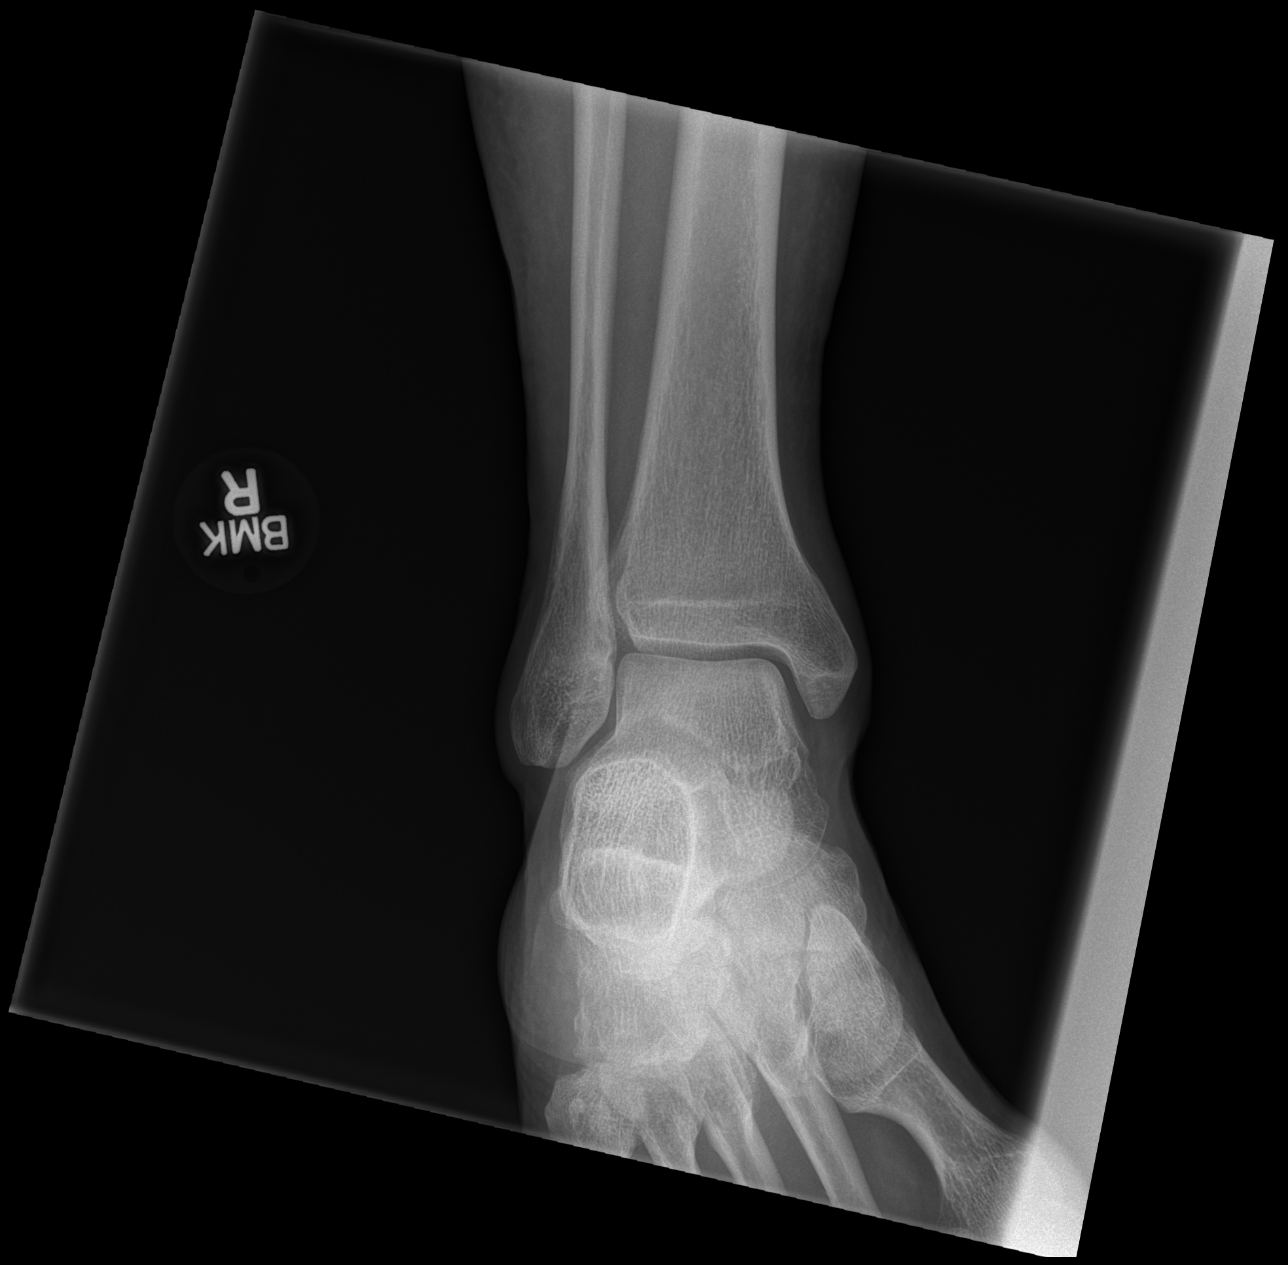
[im 3/3]
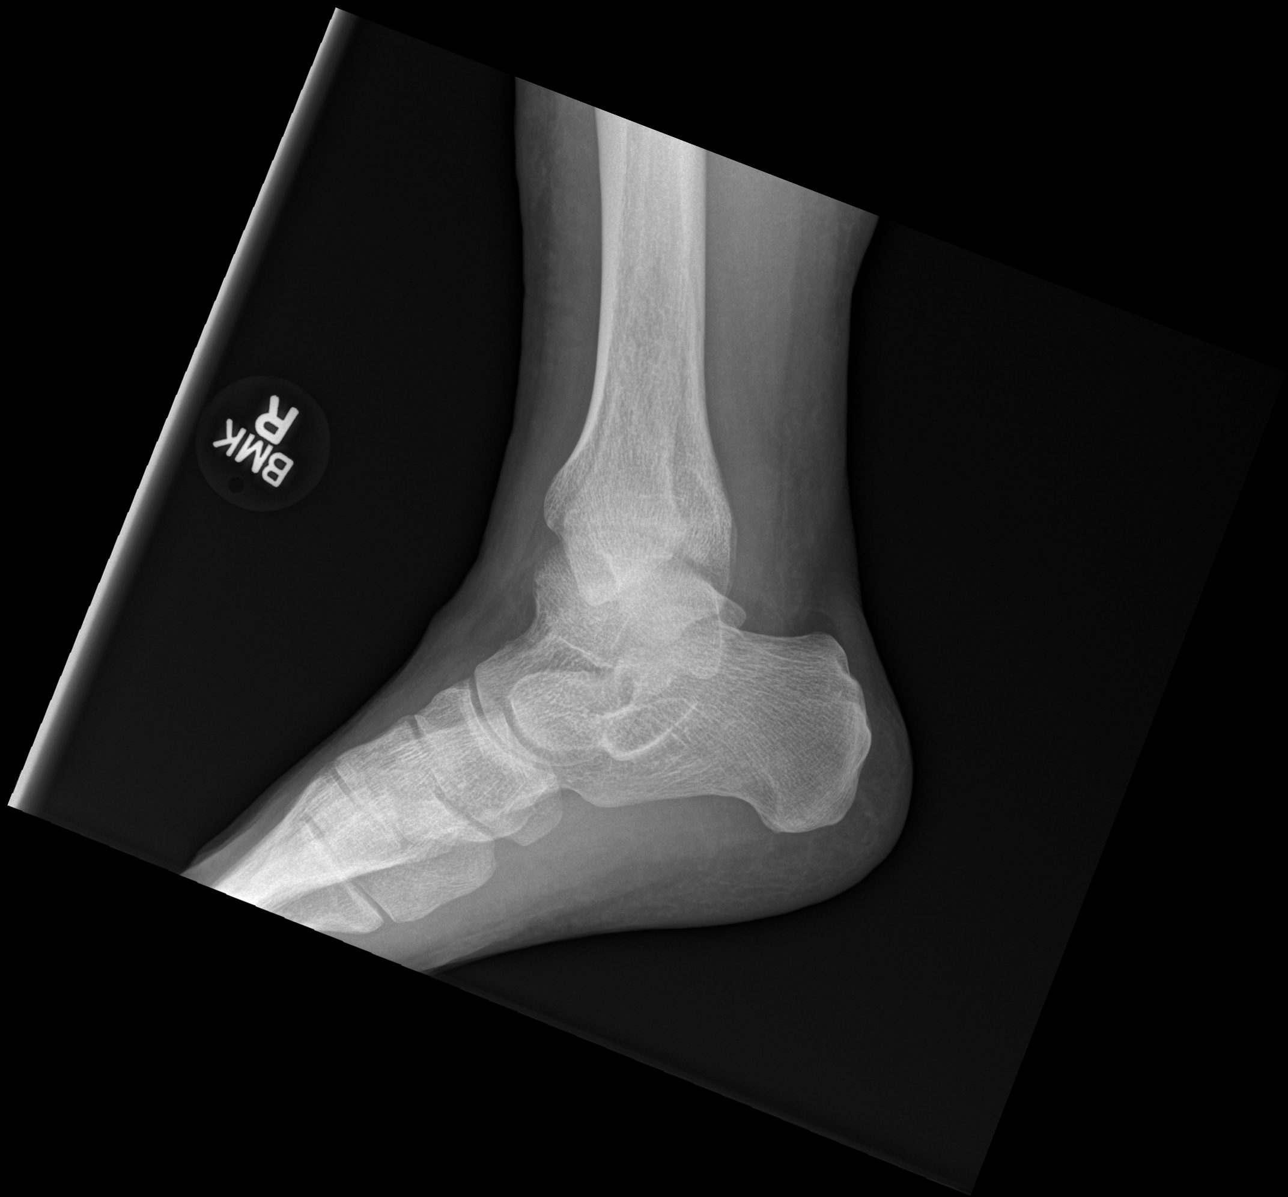

[3 of 3 positions shown; findings below may reference images not displayed]

FINDINGS: There is no evidence of fracture, dislocation, or joint effusion.
There is no evidence of arthropathy or other focal bone abnormality.
Soft tissues are unremarkable.
IMPRESSION: Negative.

## 2020-10-10 ENCOUNTER — Encounter: Payer: MEDICAID | Attending: Internal Medicine | Primary: Internal Medicine

## 2020-11-07 ENCOUNTER — Encounter: Payer: MEDICAID | Attending: Internal Medicine | Primary: Internal Medicine

## 2020-11-25 ENCOUNTER — Encounter: Payer: MEDICAID | Attending: Internal Medicine | Primary: Internal Medicine

## 2020-12-10 ENCOUNTER — Encounter: Payer: MEDICAID | Attending: Internal Medicine | Primary: Internal Medicine

## 2021-01-29 ENCOUNTER — Encounter: Payer: MEDICAID | Attending: Internal Medicine | Primary: Internal Medicine

## 2021-02-12 ENCOUNTER — Encounter: Payer: MEDICAID | Attending: Internal Medicine | Primary: Internal Medicine

## 2021-02-12 MED ORDER — OMEPRAZOLE 40 MG PO CPDR
40 | ORAL_CAPSULE | Freq: Every day | ORAL | 3 refills | Status: DC
Start: 2021-02-12 — End: 2021-07-07

## 2021-02-12 MED ORDER — METHOCARBAMOL 750 MG PO TABS
750 MG | ORAL_TABLET | Freq: Four times a day (QID) | ORAL | 2 refills | Status: DC
Start: 2021-02-12 — End: 2021-07-07

## 2021-02-12 NOTE — Telephone Encounter (Signed)
Requested Prescriptions     Pending Prescriptions Disp Refills    omeprazole (PRILOSEC) 40 MG delayed release capsule 90 capsule 3     Sig: Take 1 capsule by mouth daily    methocarbamol (ROBAXIN) 750 MG tablet 120 tablet 2     Sig: Take 1 tablet by mouth 4 times daily     Dose verified and to [facility]. Patient is scheduled for follow up visit.

## 2021-02-20 ENCOUNTER — Encounter: Payer: MEDICAID | Attending: Internal Medicine | Primary: Internal Medicine

## 2021-05-21 ENCOUNTER — Encounter: Payer: MEDICAID | Attending: Internal Medicine | Primary: Internal Medicine

## 2021-06-05 ENCOUNTER — Encounter: Payer: MEDICAID | Attending: Internal Medicine | Primary: Internal Medicine

## 2021-06-09 ENCOUNTER — Encounter: Payer: MEDICAID | Attending: Internal Medicine | Primary: Internal Medicine

## 2021-06-16 ENCOUNTER — Encounter: Payer: MEDICAID | Attending: Internal Medicine | Primary: Internal Medicine

## 2021-06-25 ENCOUNTER — Encounter: Payer: MEDICAID | Attending: Internal Medicine | Primary: Internal Medicine

## 2021-07-07 ENCOUNTER — Ambulatory Visit: Admit: 2021-07-07 | Discharge: 2021-07-07 | Payer: MEDICAID | Attending: Internal Medicine | Primary: Internal Medicine

## 2021-07-07 DIAGNOSIS — E782 Mixed hyperlipidemia: Secondary | ICD-10-CM

## 2021-07-07 MED ORDER — METHOCARBAMOL 750 MG PO TABS
750 MG | ORAL_TABLET | Freq: Four times a day (QID) | ORAL | 2 refills | Status: AC
Start: 2021-07-07 — End: 2021-11-12

## 2021-07-07 MED ORDER — OMEPRAZOLE 40 MG PO CPDR
40 MG | ORAL_CAPSULE | Freq: Every day | ORAL | 1 refills | Status: AC
Start: 2021-07-07 — End: ?

## 2021-07-07 NOTE — Progress Notes (Signed)
FOLLOWUP VISIT    Subjective:    Jeffrey Wright is a 61 y.o., male,   Chief Complaint   Patient presents with    Follow-up    Medication Refill    Lower Back Pain     Requesting MRI order to [facility]     Referral - General     Re referral to pain management. [facility]     Abdominal Pain     Mid upper stomach pain after each meal        HPI:    This patient is a 62 year old male who presents today with multiple complaints.  He was last seen on 07/11/2020.  He was noncompliant with his October 2022 follow-up visit and did not do lab work as I had recommended.    He continues to endorse chronic low back pain radiating to his right lower extremity with mild subjective right leg weakness.  He would like me to order another lumbar MRI.  He did not have the 1 that I had ordered an 2022 performed.  He would also like referral to [facility] for advanced pain.  No issues with bowel or bladder.    He also complains of upper abdominal discomfort after meals.  No hematochezia or hematemesis or melena.  No NSAID use.  Questionable fatty food intolerance.    The following portions of the patient's history were reviewed and updated as appropriate:      Past Medical History:   Diagnosis Date    BPH (benign prostatic hyperplasia)     Chronic low back pain with right-sided sciatica     History of herpes genitalis     Internal hemorrhoids     Venous insufficiency of both lower extremities        Past Surgical History:   Procedure Laterality Date    CERVICAL FUSION      anterior cervical diskectomy / fusion C4-5, C5-6    HX OPEN REDUCTION INTERNAL FIXATION      femur    SEPTOPLASTY  1983       Family History   Problem Relation Age of Onset    No Known Problems Mother     No Known Problems Father        Social History     Socioeconomic History    Marital status: Single     Spouse name: Not on file    Number of children: Not on file    Years of education: Not on file    Highest education level: Not on file    Occupational History    Not on file   Tobacco Use    Smoking status: Never    Smokeless tobacco: Never   Substance and Sexual Activity    Alcohol use: Not Currently    Drug use: Never    Sexual activity: Not on file   Other Topics Concern    Not on file   Social History Narrative    Not on file     Social Determinants of Health     Financial Resource Strain: Not on file   Food Insecurity: Not on file   Transportation Needs: Not on file   Physical Activity: Not on file   Stress: Not on file   Social Connections: Not on file   Intimate Partner Violence: Not on file   Housing Stability: Not on file       Current Outpatient Medications   Medication Sig Dispense Refill  methocarbamol (ROBAXIN) 750 MG tablet Take 1 tablet by mouth 4 times daily 120 tablet 2    omeprazole (PRILOSEC) 40 MG delayed release capsule Take 1 capsule by mouth daily 90 capsule 1    ondansetron (ZOFRAN) 4 MG tablet Take 1 tablet by mouth 3 times daily as needed for Nausea or Vomiting 15 tablet 2     No current facility-administered medications for this visit.       Allergies as of 07/07/2021 - Fully Reviewed 07/07/2021   Allergen Reaction Noted    Amoxicillin Rash 12/19/2019    Codeine Nausea And Vomiting 12/19/2019       Review of Systems   Constitutional:  Negative for activity change and appetite change.   HENT:  Negative for drooling and voice change.    Eyes:  Negative for pain.   Respiratory:  Negative for choking and stridor.    Gastrointestinal:  Negative for rectal pain.   Endocrine: Negative for polydipsia and polyphagia.   Genitourinary:  Negative for enuresis and penile pain.   Musculoskeletal:  Negative for gait problem and neck stiffness.   Skin:  Negative for pallor.   Allergic/Immunologic: Negative for immunocompromised state.   Neurological:  Negative for facial asymmetry and speech difficulty.   Hematological:  Does not bruise/bleed easily.   Psychiatric/Behavioral:  Negative for self-injury. The patient is not hyperactive.            Patient Care Team:  Claudette Head, MD as PCP - General  Claudette Head, MD as PCP - Empaneled Provider    Objective:    BP 130/80 (Site: Left Upper Arm, Position: Sitting)   Pulse 100   Ht 5\' 9"  (1.753 m)   Wt 201 lb 3.2 oz (91.3 kg)   BMI 29.71 kg/m     Physical Exam  Vitals reviewed.   Constitutional:       General: He is not in acute distress.     Appearance: Normal appearance. He is not toxic-appearing.   HENT:      Head: Normocephalic and atraumatic.      Right Ear: Tympanic membrane, ear canal and external ear normal.      Left Ear: Tympanic membrane, ear canal and external ear normal.      Nose: Nose normal.      Mouth/Throat:      Mouth: Mucous membranes are moist.      Pharynx: Oropharynx is clear.   Eyes:      General: No scleral icterus.     Extraocular Movements: Extraocular movements intact.      Conjunctiva/sclera: Conjunctivae normal.      Pupils: Pupils are equal, round, and reactive to light.   Cardiovascular:      Rate and Rhythm: Normal rate and regular rhythm.      Pulses: Normal pulses.      Heart sounds: Normal heart sounds.   Pulmonary:      Breath sounds: Normal breath sounds.   Abdominal:      General: Abdomen is flat. Bowel sounds are normal.      Palpations: Abdomen is soft. There is no mass.      Tenderness: There is no guarding or rebound.   Musculoskeletal:         General: Normal range of motion.      Cervical back: Normal range of motion and neck supple.   Skin:     General: Skin is warm and dry.      Coloration: Skin is not jaundiced.  Neurological:      Mental Status: He is alert and oriented to person, place, and time. Mental status is at baseline.      Comments: he has normal DTR's at both knee and ankles.  He has mild right lower extremity weakness with right knee flexion and extension.  He can stand on both tip toes.    Psychiatric:         Behavior: Behavior normal.         Thought Content: Thought content normal.            Admission on 06/23/2020, Discharged on  06/23/2020   Component Date Value Ref Range Status    Source 06/23/2020 Nasopharyngeal    Final    SARS-CoV-2, Rapid 06/23/2020 Not detected  NOTD   Final         Assessent & Plan:        1. Hyperlipidemia, mixed  Overview:  06/13/20 total 222 HDL 29 LDL 151 TG 211 on ad lib diet.  Labs were reviewed and discussed with patient.   Gave handout regarding low cholesterol low triglyceride diet.  He is medication apprehensive and would prefer to avoid if possible.    Orders:  -     Lipid Panel; Future  2. Hypertension, essential  Overview:  Controlled with non-pharmacologic therapy.    3. Gastroesophageal reflux disease, unspecified whether esophagitis present  Overview:  Symptoms improved with omeprazole 40 mg daily (20 mg insufficient).  The patient will continue the current treatment.       Orders:  -     omeprazole (PRILOSEC) 40 MG delayed release capsule; Take 1 capsule by mouth daily, Disp-90 capsule, R-1Normal  4. Chronic bilateral low back pain with right-sided sciatica  Overview:  01/24/16 LS spine films - there are six lumbar-type vertebral bodies. Degenerative intervertebral osteophytes at multiple levels in the lumbar spine are unchanged.  The vertebral body and disc heights are essentially preserved. There is no listhesis, fracture, lytic or blastic lesion or evidence for inflammatory spondyloarthropathy or sacroiliitis.  Bowel gas pattern is unremarkable.    Patient reports chronic low back pain with intermittent right-sided sciatica.  On examination he has normal DTR's at both knee and ankles.  He has right lower extremity weakness with right knee flexion and extension.  He can stand on both tip toes.  He reports no relief with previous attempted physical therapy while living in [place].  He has been hesitant to undergo LESI because a cousin of his had a bad outcome following LESI.  MRI ordered.  Referral to pain management placed.    Orders:  -     methocarbamol (ROBAXIN) 750 MG tablet; Take 1 tablet  by mouth 4 times daily, Disp-120 tablet, R-2Normal  -     External Referral To Pain Medicine  -     MRI LUMBAR SPINE WO CONTRAST; Future  5. Hemorrhoids, unspecified hemorrhoid type  Overview:  Patient reports chronic internal hemorrhoids.  Evaluated by [facility] in 05/2020.  Offered banding which patient declined.    6. Colon cancer screening  Overview:  The patient and I discussed colon cancer screening rationale and modalities including colonoscopy and non-invasive tests such as Cologuard.  Discussed the limitations / false positives / false negatives.   He prefers to delay colon screening for now until his back issues are improved.  7. Upper abdominal pain  Overview:  Patient reports postprandial upper abdominal discomfort without particular fatty food intolerance.  His abdominal examination today was benign.  We will check a right upper quadrant ultrasound.  He denies NSAID use.  He is already on Prilosec 40 mg daily.  If his ultrasound is negative will refer to gastroenterology.  Orders:  -     US ABDOMEN LIMITED; Future  -     CBC with Auto Differential; Future  -     Comprehensive Metabolic Panel; Future  8. Prostate cancer screening  Overview:  02/27/20 PSA 2.5      Orders:  -     PSA Screening; Future        The patient and/or patient representative voiced understanding and agreement with the current diagnoses, recommendations, and possible side effects.    Return in about 7 weeks (around 08/25/2021) for follow up of chronic medical problems, review labs.

## 2021-07-16 NOTE — Telephone Encounter (Signed)
[facility] called regarding an order for MRI of Lumber Spine we faxed over. They are requesting 2- 3 last OV notes as wells as X-ray of Lumber spine if we have one.     Faxed 3 OV notes to [phone]. We don't have records of X-ray lumber spine.

## 2021-07-17 ENCOUNTER — Ambulatory Visit: Payer: MEDICAID | Primary: Internal Medicine

## 2021-07-23 ENCOUNTER — Inpatient Hospital Stay: Payer: MEDICAID | Attending: Internal Medicine | Primary: Internal Medicine

## 2021-07-28 ENCOUNTER — Ambulatory Visit: Payer: MEDICAID | Primary: Internal Medicine

## 2021-08-07 ENCOUNTER — Ambulatory Visit: Payer: MEDICAID | Primary: Internal Medicine

## 2021-08-22 ENCOUNTER — Inpatient Hospital Stay: Payer: MEDICAID | Attending: Internal Medicine | Primary: Internal Medicine

## 2021-08-27 ENCOUNTER — Encounter: Payer: MEDICAID | Attending: Internal Medicine | Primary: Internal Medicine

## 2021-09-02 ENCOUNTER — Ambulatory Visit: Payer: MEDICAID | Primary: Internal Medicine

## 2021-09-03 ENCOUNTER — Encounter: Payer: MEDICAID | Attending: Internal Medicine | Primary: Internal Medicine

## 2021-09-11 ENCOUNTER — Ambulatory Visit: Payer: MEDICAID | Primary: Internal Medicine

## 2021-09-16 ENCOUNTER — Encounter: Payer: MEDICAID | Attending: Internal Medicine | Primary: Internal Medicine

## 2021-09-24 ENCOUNTER — Encounter: Payer: MEDICAID | Attending: Internal Medicine | Primary: Internal Medicine

## 2021-09-30 NOTE — Telephone Encounter (Signed)
I cannot evaluate a sore on tongue without an in person visit.

## 2021-09-30 NOTE — Telephone Encounter (Signed)
Girlfriend called stating that he noticed a little sore on his tongue and concerned about it.   Patient is scheduled for follow up visit on 10/08/2021@3 :40.   Does he need to be seen earlier or requesting an advise.

## 2021-10-06 NOTE — Telephone Encounter (Signed)
Patient has never returned a call from [place] to schedule an appointment per [facility] stuff.

## 2021-10-08 ENCOUNTER — Encounter: Payer: MEDICAID | Attending: Internal Medicine | Primary: Internal Medicine

## 2021-10-09 ENCOUNTER — Encounter: Payer: MEDICAID | Attending: Internal Medicine | Primary: Internal Medicine

## 2021-10-16 ENCOUNTER — Encounter: Payer: MEDICAID | Attending: Internal Medicine | Primary: Internal Medicine

## 2021-10-16 NOTE — Telephone Encounter (Signed)
Formatting of this note might be different from the original.  Patient has an appt on October 24 with Clinic services. Contacted patient and LVM offering sooner appt 10.12.2023//LP  Electronically signed by Lita Mains at 10/16/2021  2:09 PM EDT

## 2021-10-30 ENCOUNTER — Encounter: Payer: MEDICAID | Attending: Internal Medicine | Primary: Internal Medicine

## 2021-11-03 ENCOUNTER — Encounter: Payer: MEDICAID | Attending: Internal Medicine | Primary: Internal Medicine

## 2021-11-04 ENCOUNTER — Encounter: Payer: MEDICAID | Attending: Internal Medicine | Primary: Internal Medicine

## 2021-11-10 ENCOUNTER — Encounter: Payer: MEDICAID | Attending: Internal Medicine | Primary: Internal Medicine

## 2021-11-12 ENCOUNTER — Ambulatory Visit: Admit: 2021-11-12 | Discharge: 2021-11-12 | Payer: MEDICAID | Attending: Internal Medicine | Primary: Internal Medicine

## 2021-11-12 DIAGNOSIS — G8929 Other chronic pain: Secondary | ICD-10-CM

## 2021-11-12 MED ORDER — METHOCARBAMOL 750 MG PO TABS
750 MG | ORAL_TABLET | Freq: Four times a day (QID) | ORAL | 1 refills | Status: AC
Start: 2021-11-12 — End: ?

## 2021-11-12 MED ORDER — ACYCLOVIR 400 MG PO TABS
400 MG | ORAL_TABLET | Freq: Two times a day (BID) | ORAL | 1 refills | Status: AC
Start: 2021-11-12 — End: 2022-01-11

## 2021-11-12 NOTE — Progress Notes (Signed)
FOLLOWUP VISIT    Subjective:    Jeffrey Wright is a 61 y.o., male,   Chief Complaint   Patient presents with    Follow-up    Mouth Lesions    Referral - General     Pain management and Colon and Rectal surgery     Orders     MRI of lumber and thoracic spine     Constipation       HPI:    This patient is a 61 year male who presents today for delayed followup.  He was last seen on 07/07/21 at which time I ordered labs and a lumbar MRI and and abdominal US.  He was noncompliant with labs and MRI and ultrasound.  He was to have returned to see me in 08/2021 but was noncompliant with follow up.  He failed to keep many office visits between 08/2021 and now.  Today he presents with numerous requests.      His blood pressure is elevated today.  He has had borderline elevations and white coat effect in the past.     He has recurrent genital herpes.  He would like to resume prophylactic acyclovir which he used in the past.      He continues to have chronic low back pain with right sided sciatica.  At the time of his last appointment in July I ordered an MRI and referred him to pain management.  He was noncompliant with MRI.  He states he was recently discharged from his most recent pain management Dr. Dema Severin practice due to multiple missed appointment.  He states "I need you to refer me to a different pain doctor that takes Medicaid and prescribes pain pills."  He also wants me to reorder the lumbar MRI.      He reports chronic constipation.  He states he had a colonoscopy in [place] in 2018 that was okay except for hemorrhoids.  He was seen by [facility] in 05/2020 for hemorrhoids but declined banding at that time.  He wants me to re-refer him to colorectal surgery for his constipation.      He reports tongue lesions and worries that his genital herpes might have spread to his tongue.      The following portions of the patient's history were reviewed and updated as appropriate:      Past Medical History:    Diagnosis Date    BPH (benign prostatic hyperplasia)     Chronic low back pain with right-sided sciatica     History of herpes genitalis     Internal hemorrhoids     Venous insufficiency of both lower extremities        Past Surgical History:   Procedure Laterality Date    CERVICAL FUSION      anterior cervical diskectomy / fusion C4-5, C5-6    HX OPEN REDUCTION INTERNAL FIXATION      femur    SEPTOPLASTY  1983       Family History   Problem Relation Age of Onset    No Known Problems Mother     No Known Problems Father        Social History     Socioeconomic History    Marital status: Single     Spouse name: Not on file    Number of children: Not on file    Years of education: Not on file    Highest education level: Not on file   Occupational History  Not on file   Tobacco Use    Smoking status: Never    Smokeless tobacco: Never   Substance and Sexual Activity    Alcohol use: Not Currently    Drug use: Never    Sexual activity: Not on file   Other Topics Concern    Not on file   Social History Narrative    Not on file     Social Determinants of Health     Financial Resource Strain: Not on file   Food Insecurity: Not on file   Transportation Needs: Not on file   Physical Activity: Not on file   Stress: Not on file   Social Connections: Not on file   Intimate Partner Violence: Not on file   Housing Stability: Not on file       Current Outpatient Medications   Medication Sig Dispense Refill    methocarbamol (ROBAXIN) 750 MG tablet Take 1 tablet by mouth 4 times daily 120 tablet 1    acyclovir (ZOVIRAX) 400 MG tablet Take 1 tablet by mouth in the morning and at bedtime 60 tablet 1    omeprazole (PRILOSEC) 40 MG delayed release capsule Take 1 capsule by mouth daily 90 capsule 1    ondansetron (ZOFRAN) 4 MG tablet Take 1 tablet by mouth 3 times daily as needed for Nausea or Vomiting 15 tablet 2     No current facility-administered medications for this visit.       Allergies as of 11/12/2021 - Fully Reviewed 11/12/2021    Allergen Reaction Noted    Amoxicillin Rash 12/19/2019    Codeine Nausea And Vomiting 12/19/2019       Review of Systems   Constitutional:  Negative for activity change and appetite change.   HENT:  Negative for drooling and voice change.    Eyes:  Negative for pain.   Respiratory:  Negative for choking and stridor.    Gastrointestinal:  Negative for rectal pain.   Endocrine: Negative for polydipsia and polyphagia.   Genitourinary:  Negative for enuresis and penile pain.   Musculoskeletal:  Negative for gait problem and neck stiffness.   Skin:  Negative for pallor.   Allergic/Immunologic: Negative for immunocompromised state.   Neurological:  Negative for facial asymmetry and speech difficulty.   Hematological:  Does not bruise/bleed easily.   Psychiatric/Behavioral:  Negative for self-injury. The patient is not hyperactive.             Patient Care Team:  Claudette Head, MD as PCP - Carin Hock, Rolm Gala, MD as PCP - Empaneled Provider    Objective:    BP (!) 160/98 (Site: Left Upper Arm, Position: Sitting)   Pulse 98   Ht 1.753 m (5\' 9" )   Wt 90.7 kg (200 lb)   BMI 29.53 kg/m     Physical Exam  Vitals reviewed.   Constitutional:       General: He is not in acute distress.     Appearance: Normal appearance. He is not toxic-appearing.   HENT:      Head: Normocephalic and atraumatic.      Comments: He has a geographic tongue.  No herpetic lesions present.       Right Ear: Tympanic membrane, ear canal and external ear normal.      Left Ear: Tympanic membrane, ear canal and external ear normal.      Nose: Nose normal.      Mouth/Throat:      Mouth: Mucous membranes are moist.  Pharynx: Oropharynx is clear.   Eyes:      General: No scleral icterus.     Extraocular Movements: Extraocular movements intact.      Conjunctiva/sclera: Conjunctivae normal.      Pupils: Pupils are equal, round, and reactive to light.   Cardiovascular:      Rate and Rhythm: Normal rate and regular rhythm.      Pulses: Normal pulses.       Heart sounds: Normal heart sounds.   Pulmonary:      Breath sounds: Normal breath sounds.   Abdominal:      General: Abdomen is flat. Bowel sounds are normal.      Palpations: Abdomen is soft. There is no mass.      Tenderness: There is no guarding or rebound.   Musculoskeletal:         General: Normal range of motion.      Cervical back: Normal range of motion and neck supple.   Skin:     General: Skin is warm and dry.      Coloration: Skin is not jaundiced.   Neurological:      Mental Status: He is alert and oriented to person, place, and time. Mental status is at baseline.   Psychiatric:         Behavior: Behavior normal.         Thought Content: Thought content normal.              Admission on 06/23/2020, Discharged on 06/23/2020   Component Date Value Ref Range Status    Source 06/23/2020 Nasopharyngeal    Final    SARS-CoV-2, Rapid 06/23/2020 Not detected  NOTD   Final         Assessent & Plan:        1. Chronic bilateral low back pain with right-sided sciatica  Overview:  01/24/16 LS spine films - there are six lumbar-type vertebral bodies. Degenerative intervertebral osteophytes at multiple levels in the lumbar spine are unchanged.  The vertebral body and disc heights are essentially preserved. There is no listhesis, fracture, lytic or blastic lesion or evidence for inflammatory spondyloarthropathy or sacroiliitis.  Bowel gas pattern is unremarkable.    Patient reports chronic low back pain with intermittent right-sided sciatica.  He was discharged from [facility] due to frequent missed visits.  He was noncompliant with the lumbar MRI I had ordered in July 2023.  He requests that I reorder the MRI and refer him to another pain specialist.  Will re-refer to pain management and defer imaging to them.    Orders:  -     methocarbamol (ROBAXIN) 750 MG tablet; Take 1 tablet by mouth 4 times daily, Disp-120 tablet, R-1Normal  -     [facility] - Baksh, Javid, DO, Pain Management, [place]  2. Prostate  cancer screening  Overview:  02/27/20 PSA 2.5.      Patient had an annual PSA ordered on 07/07/21 but he was noncompliant with labs.   3. Hyperlipidemia, mixed  Overview:  06/13/20 total 222 HDL 29 LDL 151 TG 211 on ad lib diet.  Labs were reviewed and discussed with patient.   Gave handout regarding low cholesterol low triglyceride diet.  He is medication apprehensive and would prefer to avoid if possible.    4. Gastroesophageal reflux disease, unspecified whether esophagitis present  Overview:  Symptoms improved with omeprazole 40 mg daily (20 mg insufficient).  The patient will continue the current treatment.  5. Hypertension, essential  Overview:  Patient has an elevated blood pressure elevation today.  Blood pressure last visit normal.  Possible white coat effect.  Recommended outpatient / ambulatory monitoring.  If home BP confirms persistent elevation would need treatment at that time.    6. Chronic constipation  Overview:  He reports chronic constipation.  He was advised regarding adequate water intake.  Daily metamucil.  Colace BID.  Dulcolax PRN.    7. Medically noncompliant  Overview:  Patient has been repeatedly noncompliant.  He was noncompliant with three month follow up with me after his 07/11/20 visit.  He was noncompliant with the recommended 7 week follow up visit with me following his 07/07/21 office visit.  He has been repeatedly noncompliant with labs and imaging studies.  He failed to keep multiple scheduled appointments between 08/2021 and 11/2021.  He has been recently discharged from [facility] due to non-compliance.  At this point I am no longer comfortable continuing to serve as his PCP given his chronic non-compliance and he will be discharged from the practice.    8. Recurrent genital herpes  Overview:  Patient describes recurrent genital herpes and requests antivirals for daily suppression.  Start acyclovir 400 mg PO BID.    Orders:  -     acyclovir (ZOVIRAX) 400 MG  tablet; Take 1 tablet by mouth in the morning and at bedtime, Disp-60 tablet, R-1Normal        The patient and/or patient representative voiced understanding and agreement with the current diagnoses, recommendations, and possible side effects.    Return for No follow up to be scheduled at this time.  Marland Kitchen

## 2021-11-13 ENCOUNTER — Encounter

## 2021-11-13 MED ORDER — AMLODIPINE BESYLATE 5 MG PO TABS
5 MG | ORAL_TABLET | Freq: Every day | ORAL | 1 refills | Status: AC
Start: 2021-11-13 — End: 2021-12-18

## 2021-11-13 NOTE — Telephone Encounter (Signed)
Patients Pharmacy does not have their blood pressure medication called in

## 2021-11-14 ENCOUNTER — Emergency Department: Admit: 2021-11-14 | Payer: MEDICAID | Primary: Internal Medicine

## 2021-11-14 ENCOUNTER — Inpatient Hospital Stay
Admit: 2021-11-14 | Discharge: 2021-11-15 | Disposition: A | Discharge status: Home / Self Care | Payer: MEDICAID | Attending: Student in an Organized Health Care Education/Training Program

## 2021-11-14 DIAGNOSIS — R55 Syncope and collapse: Secondary | ICD-10-CM

## 2021-11-14 LAB — CBC WITH AUTO DIFFERENTIAL
Absolute Immature Granulocyte: 0 10*3/uL (ref 0.0–0.5)
Basophils %: 1 % (ref 0.0–2.0)
Basophils Absolute: 0.1 10*3/uL (ref 0.0–0.2)
Eosinophils %: 2 % (ref 0.5–7.8)
Eosinophils Absolute: 0.1 10*3/uL (ref 0.0–0.8)
Hematocrit: 46.3 % (ref 41.1–50.3)
Hemoglobin: 15.8 g/dL (ref 13.6–17.2)
Immature Granulocytes: 0 % (ref 0.0–5.0)
Lymphocytes %: 25 % (ref 13–44)
Lymphocytes Absolute: 2 10*3/uL (ref 0.5–4.6)
MCH: 32.6 PG (ref 26.1–32.9)
MCHC: 34.1 g/dL (ref 31.4–35.0)
MCV: 95.5 FL (ref 82.0–102.0)
MPV: 9.1 FL — ABNORMAL LOW (ref 9.4–12.3)
Monocytes %: 9 % (ref 4.0–12.0)
Monocytes Absolute: 0.8 10*3/uL (ref 0.1–1.3)
Neutrophils %: 64 % (ref 43–78)
Neutrophils Absolute: 5.3 10*3/uL (ref 1.7–8.2)
Platelets: 264 10*3/uL (ref 150–450)
RBC: 4.85 M/uL (ref 4.23–5.60)
RDW: 13.1 % (ref 11.9–14.6)
WBC: 8.3 10*3/uL (ref 4.3–11.1)
nRBC: 0 10*3/uL (ref 0.0–0.2)

## 2021-11-14 LAB — BASIC METABOLIC PANEL
Anion Gap: 10 mmol/L (ref 2–11)
BUN: 12 MG/DL (ref 8–23)
CO2: 26 mmol/L (ref 21–32)
Calcium: 9.6 MG/DL (ref 8.3–10.4)
Chloride: 103 mmol/L (ref 98–107)
Creatinine: 0.7 MG/DL — ABNORMAL LOW (ref 0.8–1.5)
Est, Glom Filt Rate: >60 mL/min/{1.73_m2} (ref >60)
Glucose: 113 mg/dL — ABNORMAL HIGH (ref 65–100)
Potassium: 4.3 mmol/L (ref 3.5–5.1)
Sodium: 139 mmol/L (ref 133–143)

## 2021-11-14 LAB — HEPATIC FUNCTION PANEL
ALT: 29 U/L (ref 13.0–61.0)
AST: 28 U/L (ref 15–37)
Albumin/Globulin Ratio: 1.3 (ref 0.4–1.6)
Albumin: 4.3 g/dL (ref 3.2–4.6)
Alk Phosphatase: 137 U/L — ABNORMAL HIGH (ref 45.0–117.0)
Bilirubin, Direct: 0.2 MG/DL (ref <0.4)
Globulin: 3.4 g/dL (ref 2.8–4.5)
Total Bilirubin: 0.9 MG/DL (ref 0.2–1.1)
Total Protein: 7.7 g/dL (ref 6.4–8.2)

## 2021-11-14 LAB — TROPONIN T: Troponin T: 7.7 ng/L (ref 0–22)

## 2021-11-14 MED ORDER — SODIUM CHLORIDE 0.9 % IV BOLUS
0.9 % | INTRAVENOUS | Status: AC
Start: 2021-11-14 — End: 2021-11-14
  Administered 2021-11-14: 23:00:00 1000 mL via INTRAVENOUS

## 2021-11-14 NOTE — Telephone Encounter (Signed)
5 mg is a typical starting dose for amlodipine and that is why I prescribed it at that dose.

## 2021-11-14 NOTE — Discharge Instructions (Signed)
Increase fluid intake throughout the day to prevent episodes of dizziness/lightheadedness.  As a precaution you may split your amlodipine dose in half, take 2.5 mg in the morning followed by 2.5 mg at night.  Most importantly obtain a blood pressure cuff that provides accurate measurements of your blood pressure.  Measure your blood pressure no more than twice daily and record these results for discussion with your physician.  Please arrange follow-up with Dr. Luana Shu within 1 week for recheck.

## 2021-11-14 NOTE — ED Triage Notes (Addendum)
Pt ambulatory to triage for reports of dizziness, blurry vision & palpitations. Pt states he has been dealing with high blood pressure for the past few months & saw his doctor yesterday who started him on 5mg  amlodipine. Pt states he took his first dose this afternoon prior to all of his symptoms started. Pt denies chest pain but reports "fluttering." Pt denies nausea at this time. Pt unsure if symptoms were related to medication or not.

## 2021-11-14 NOTE — ED Notes (Signed)
I have reviewed discharge instructions with the patient and family.  The patient and family verbalized understanding.    Patient left ED via Discharge Method: ambulatory to Home with family    Opportunity for questions and clarification provided.       Patient given 0 scripts.         To continue your aftercare when you leave the hospital, you may receive an automated call from our care team to check in on how you are doing.  This is a free service and part of our promise to provide the best care and service to meet your aftercare needs." If you have questions, or wish to unsubscribe from this service please call [phone].  Thank you for Choosing our [facility] Emergency Department.        Lupe Carney, RN  11/14/21 2022

## 2021-11-14 NOTE — ED Provider Notes (Signed)
Emergency Department Provider Note       PCP: Nena Polio, MD   Age: 61 y.o.   Sex: male     [place]    1. Vasovagal near-syncope  R55           Medical Decision Making     Complexity of Problems Addressed:  1 or more acute illnesses that pose a threat to life or bodily function.     Data Reviewed and Analyzed:  I independently ordered and reviewed each unique test.  I reviewed external records: ED visit note from an outside group.  I reviewed external records: provider visit note from PCP.  I reviewed external records: provider visit note from outside specialist.   The patients assessment required an independent historian: Significant other at bedside.  The reason they were needed is important historical information not provided by the patient.    I interpreted the X-rays no focal infiltrate.    Discussion of management or test interpretation.  Generally well-appearing 61 year old male recently diagnosed with hypertension started on amlodipine presents to this department with reports of near syncopal event and palpitations following bowel movement today.  Patient is concerned that the symptoms may be related to his amlodipine.  This was his first dose of this medication at 5 mg.  Upon arrival to this department, he has an elevated blood pressure.  His symptoms are consistent with vasovagal near syncopal event which may be related to attempted bowel movement.  We will check basic labs, EKG and chest x-ray.  Discussed option for splitting his amlodipine dose in half as a precaution initially.  Patient also did not eat or drink much prior to the onset of symptoms today.  Will advise that he treat plenty fluids and eat regular meals throughout the day to prevent episodes of dizziness.  He also has plans to purchase a new blood pressure cuff to monitor his blood pressure more effectively at home.      Risk of Complications and/or Morbidity of Patient Management:  Shared medical decision making  was utilized in creating the patients health plan today.         History       61 year old male patient presents to this department with reports of palpitations, lightheadedness and description of near syncopal event earlier today.  Patient states symptoms started shortly after 2 PM, after taking his newly prescribed amlodipine 5 mg tablet.  Patient states he was seen by his provider yesterday and started on this medication today.  It was his first dose at 2 PM.  Patient attributes his symptoms mentioned above to this medication.  He states he was attempting to have a bowel movement, passed a small amount of stool and then began to feel the symptoms of lightheadedness, dizziness nausea and blurred vision.  Patient denies full loss of consciousness states he was unable to complete his bowel movement.  He reports a long history of frequent constipation and laxative/stool softener usage secondary to history of previous rectal prolapse.  Patient denies any black bloody or tarry stool.  He reports no associated fever or chills.  He states he wakes typically around 1 PM in the afternoon, eats a breakfast at that time and then has a bowel movement normally.  He reports little intake of food and fluid prior to the onset of symptoms today.    The history is provided by the patient and a significant other. No language interpreter  was used.        Review of Systems   Cardiovascular:  Positive for palpitations.   Neurological:  Positive for dizziness and light-headedness.       Physical Exam     Vitals signs and nursing note reviewed:  Vitals:    11/14/21 1659   BP: (!) 178/110   Pulse: 94   Resp: 20   Temp: 99.3 F (37.4 C)   SpO2: 98%   Weight: 90.7 kg (200 lb)   Height: 1.753 m (_0 )      Physical Exam  Vitals and nursing note reviewed.   Constitutional:       General: He is not in acute distress.     Appearance: Normal appearance. He is normal weight. He is not ill-appearing or toxic-appearing.      Comments: Generally  well-appearing, alert and oriented x4.  No acute distress, speaks in clear, fluid sentences.   HENT:      Head: Normocephalic and atraumatic.      Right Ear: External ear normal.      Left Ear: External ear normal.      Nose: Nose normal.      Mouth/Throat:      Mouth: Mucous membranes are moist.   Eyes:      General: No scleral icterus.        Right eye: No discharge.         Left eye: No discharge.      Extraocular Movements: Extraocular movements intact.   Cardiovascular:      Rate and Rhythm: Normal rate and regular rhythm.      Pulses: Normal pulses.      Heart sounds: Normal heart sounds.   Pulmonary:      Effort: Pulmonary effort is normal. No tachypnea, bradypnea, accessory muscle usage, prolonged expiration or respiratory distress.      Breath sounds: Normal breath sounds and air entry. No stridor. No decreased breath sounds, wheezing, rhonchi or rales.   Abdominal:      General: Abdomen is flat. There is no distension.      Palpations: There is no mass.      Tenderness: There is no abdominal tenderness. There is no right CVA tenderness, left CVA tenderness, guarding or rebound. Negative signs include Murphy's sign and McBurney's sign.      Hernia: No hernia is present.      Comments: No rebound guarding or distention.   Musculoskeletal:         General: No swelling, tenderness or deformity. Normal range of motion.      Cervical back: Normal range of motion.   Skin:     General: Skin is warm.      Capillary Refill: Capillary refill takes less than 2 seconds.   Neurological:      General: No focal deficit present.      Mental Status: He is alert.   Psychiatric:         Mood and Affect: Mood normal.          Procedures     Procedures    Orders Placed This Encounter   Procedures    XR CHEST (2 VW)    Basic Metabolic Panel    CBC with Auto Differential    Troponin T    Hepatic Function Panel    Cardiac Monitor - ED Only    Pulse oximetry, continuous    EKG 12 Lead    Saline lock IV  Medications given  during this emergency department visit:  Medications   sodium chloride 0.9 % bolus 1,000 mL (1,000 mLs IntraVENous New Bag 11/14/21 1741)       New Prescriptions    No medications on file        Past Medical History:   Diagnosis Date    BPH (benign prostatic hyperplasia)     Chronic low back pain with right-sided sciatica     History of herpes genitalis     Internal hemorrhoids     Venous insufficiency of both lower extremities         Past Surgical History:   Procedure Laterality Date    CERVICAL FUSION      anterior cervical diskectomy / fusion C4-5, C5-6    HX OPEN REDUCTION INTERNAL FIXATION      femur    SEPTOPLASTY  1983        Social History     Socioeconomic History    Marital status: Single   Tobacco Use    Smoking status: Never    Smokeless tobacco: Never   Substance and Sexual Activity    Alcohol use: Not Currently    Drug use: Never        Previous Medications    ACYCLOVIR (ZOVIRAX) 400 MG TABLET    Take 1 tablet by mouth in the morning and at bedtime    AMLODIPINE (NORVASC) 5 MG TABLET    Take 1 tablet by mouth daily    METHOCARBAMOL (ROBAXIN) 750 MG TABLET    Take 1 tablet by mouth 4 times daily    OMEPRAZOLE (PRILOSEC) 40 MG DELAYED RELEASE CAPSULE    Take 1 capsule by mouth daily    ONDANSETRON (ZOFRAN) 4 MG TABLET    Take 1 tablet by mouth 3 times daily as needed for Nausea or Vomiting        Results for orders placed or performed during the hospital encounter of 67/89/38   Basic Metabolic Panel   Result Value Ref Range    Sodium 139 133 - 143 mmol/L    Potassium 4.3 3.5 - 5.1 mmol/L    Chloride 103 98 - 107 mmol/L    CO2 26 21 - 32 mmol/L    Anion Gap 10 2 - 11 mmol/L    Glucose 113 (H) 65 - 100 mg/dL    BUN 12 8 - 23 MG/DL    Creatinine 0.70 (L) 0.8 - 1.5 MG/DL    Est, Glom Filt Rate >60 >60 ml/min/1.30m    Calcium 9.6 8.3 - 10.4 MG/DL   CBC with Auto Differential   Result Value Ref Range    WBC 8.3 4.3 - 11.1 K/uL    RBC 4.85 4.23 - 5.60 M/uL    Hemoglobin 15.8 13.6 - 17.2 g/dL    Hematocrit  46.3 41.1 - 50.3 %    MCV 95.5 82.0 - 102.0 FL    MCH 32.6 26.1 - 32.9 PG    MCHC 34.1 31.4 - 35.0 g/dL    RDW 13.1 11.9 - 14.6 %    Platelets 264 150 - 450 K/uL    MPV 9.1 (L) 9.4 - 12.3 FL    nRBC 0.00 0.0 - 0.2 K/uL    Differential Type AUTOMATED      Neutrophils % 64 43 - 78 %    Lymphocytes % 25 13 - 44 %    Monocytes % 9 4.0 - 12.0 %    Eosinophils % 2 0.5 -  7.8 %    Basophils % 1 0.0 - 2.0 %    Immature Granulocytes 0 0.0 - 5.0 %    Neutrophils Absolute 5.3 1.7 - 8.2 K/UL    Lymphocytes Absolute 2.0 0.5 - 4.6 K/UL    Monocytes Absolute 0.8 0.1 - 1.3 K/UL    Eosinophils Absolute 0.1 0.0 - 0.8 K/UL    Basophils Absolute 0.1 0.0 - 0.2 K/UL    Absolute Immature Granulocyte 0.0 0.0 - 0.5 K/UL   Troponin T   Result Value Ref Range    Troponin T 7.7 0 - 22 ng/L   Hepatic Function Panel   Result Value Ref Range    Total Protein 7.7 6.4 - 8.2 g/dL    Albumin 4.3 3.2 - 4.6 g/dL    Globulin 3.4 2.8 - 4.5 g/dL    Albumin/Globulin Ratio 1.3 0.4 - 1.6      Total Bilirubin 0.9 0.2 - 1.1 MG/DL    Bilirubin, Direct 0.2 <0.4 MG/DL    Alk Phosphatase 137 (H) 45.0 - 117.0 U/L    AST 28 15 - 37 U/L    ALT 29 13.0 - 61.0 U/L        XR CHEST (2 VW)    (Results Pending)                     Voice dictation software was used during the making of this note.  This software is not perfect and grammatical and other typographical errors may be present.  This note has not been completely proofread for errors.        Debera Lat, [place]  11/14/21 1831

## 2021-11-15 LAB — EKG 12-LEAD
Atrial Rate: 86 {beats}/min
P Axis: 61 degrees
P-R Interval: 135 ms
Q-T Interval: 350 ms
QRS Duration: 90 ms
QTc Calculation (Bazett): 421 ms
R Axis: 48 degrees
T Axis: 59 degrees
Ventricular Rate: 87 {beats}/min

## 2021-11-15 LAB — TROPONIN T: Troponin T: 7.3 ng/L (ref 0–22)

## 2021-11-16 ENCOUNTER — Emergency Department: Admit: 2021-11-16 | Payer: MEDICAID | Primary: Internal Medicine

## 2021-11-16 ENCOUNTER — Emergency Department: Admit: 2021-11-17 | Payer: MEDICAID | Primary: Internal Medicine

## 2021-11-16 ENCOUNTER — Inpatient Hospital Stay
Admit: 2021-11-16 | Discharge: 2021-11-17 | Disposition: A | Discharge status: Home / Self Care | Payer: MEDICAID | Attending: Emergency Medicine

## 2021-11-16 DIAGNOSIS — I10 Essential (primary) hypertension: Secondary | ICD-10-CM

## 2021-11-16 DIAGNOSIS — R42 Dizziness and giddiness: Secondary | ICD-10-CM

## 2021-11-16 LAB — CBC WITH AUTO DIFFERENTIAL
Absolute Immature Granulocyte: 0.1 10*3/uL (ref 0.0–0.5)
Basophils %: 1 % (ref 0.0–2.0)
Basophils Absolute: 0.1 10*3/uL (ref 0.0–0.2)
Eosinophils %: 1 % (ref 0.5–7.8)
Eosinophils Absolute: 0.1 10*3/uL (ref 0.0–0.8)
Hematocrit: 45.7 % (ref 41.1–50.3)
Hemoglobin: 15.7 g/dL (ref 13.6–17.2)
Immature Granulocytes: 1 % (ref 0.0–5.0)
Lymphocytes %: 19 % (ref 13–44)
Lymphocytes Absolute: 1.7 10*3/uL (ref 0.5–4.6)
MCH: 32.9 PG (ref 26.1–32.9)
MCHC: 34.4 g/dL (ref 31.4–35.0)
MCV: 95.8 FL (ref 82.0–102.0)
MPV: 8.9 FL — ABNORMAL LOW (ref 9.4–12.3)
Monocytes %: 10 % (ref 4.0–12.0)
Monocytes Absolute: 0.9 10*3/uL (ref 0.1–1.3)
Neutrophils %: 69 % (ref 43–78)
Neutrophils Absolute: 6.2 10*3/uL (ref 1.7–8.2)
Platelets: 259 10*3/uL (ref 150–450)
RBC: 4.77 M/uL (ref 4.23–5.60)
RDW: 12.8 % (ref 11.9–14.6)
WBC: 8.9 10*3/uL (ref 4.3–11.1)
nRBC: 0 10*3/uL (ref 0.0–0.2)

## 2021-11-16 LAB — BASIC METABOLIC PANEL
Anion Gap: 11 mmol/L (ref 2–11)
BUN: 11 MG/DL (ref 8–23)
CO2: 26 mmol/L (ref 21–32)
Calcium: 9.7 MG/DL (ref 8.3–10.4)
Chloride: 103 mmol/L (ref 98–107)
Creatinine: 0.68 MG/DL — ABNORMAL LOW (ref 0.8–1.5)
Est, Glom Filt Rate: >60 mL/min/{1.73_m2} (ref >60)
Glucose: 104 mg/dL — ABNORMAL HIGH (ref 65–100)
Potassium: 4.3 mmol/L (ref 3.5–5.1)
Sodium: 140 mmol/L (ref 133–143)

## 2021-11-16 LAB — TROPONIN T: Troponin T: 7.7 ng/L (ref 0–22)

## 2021-11-16 NOTE — ED Notes (Signed)
Pt ambulatory to restroom and back to room with steady gait. Pt reconnected to monitor. Repeat troponin obtained, labeled and sent to lab. Pt resting semi-fowlers on stretcher talking with MD Ta at this time. Pt in NAD and denies any further needs at this time. Side rail x1, call bell within reach. Pt family at the bedside. Will continue to monitor.      Geanie Logan, RN  11/16/21 1914

## 2021-11-16 NOTE — ED Notes (Signed)
Patient is refusing to allow me to continue triage or start blood work until he uses the bathroom.      Karna Christmas, RN  11/16/21 1742

## 2021-11-16 NOTE — ED Notes (Signed)
I have reviewed discharge instructions with the patient.  The patient verbalized understanding.    Patient left ED via Discharge Method: ambulatory to Home with significant other.    Opportunity for questions and clarification provided.       Patient given 1 scripts.         To continue your aftercare when you leave the hospital, you may receive an automated call from our care team to check in on how you are doing.  This is a free service and part of our promise to provide the best care and service to meet your aftercare needs." If you have questions, or wish to unsubscribe from this service please call [phone].  Thank you for Choosing our [facility] Emergency Department.        Geanie Logan, RN  11/16/21 2103

## 2021-11-16 NOTE — Discharge Instructions (Addendum)
Complete course of azithromycin for sinusitis.  Follow-up with your primary care doctor for recheck of your blood pressure medication.  Return for any emergency.    Please follow-up with your primary care doctor regarding this nonspecific finding on your chest x-ray.  1. Rounded opacity overlying the lower thoracic spine on the lateral projection is again seen. This may represent a lung nodule or possibly a spur off the lateral aspect of the thoracic spine. Follow-up CT imaging is recommended for further assessment.

## 2021-11-16 NOTE — ED Provider Notes (Signed)
Emergency Department Provider Note       PCP: Claudette Head, MD   Age: 61 y.o.   Sex: male     DISPOSITION Decision To Discharge 11/16/2021 08:43:01 PM       ICD-10-CM    1. Primary hypertension  I10       2. Dizziness  R42       3. Acute non-recurrent sinusitis, unspecified location  J01.90           Medical Decision Making     Complexity of Problems Addressed:  1 or more acute illnesses that pose a threat to life or bodily function.     Data Reviewed and Analyzed:   I independently ordered and reviewed each unique test.  I reviewed external records: provider visit note from PCP.   The patients assessment required an independent historian: family member.  The reason they were needed is important historical information not provided by the patient.    I independently ordered and interpreted the ED EKG in the absence of a Cardiologist.    See below         I interpreted the X-rays chest x-ray without consolidation or infiltrate.  Questionable abnormal finding noted on x-ray that has been seen in the past..  I interpreted the CT Scan agree with radiologist interpretation..  I interpreted the labs.    Discussion of management or test interpretation.  Neuro intact.  No pronator drift.  Does not appear ataxic.  Serial troponins are negative.  Otherwise labs are fairly unremarkable and baseline.  CT head is unremarkable.  Chest x-ray with questionable rounded opacity on either the spinous region or the lungs.  I did give him a reminder and discussed with him for follow-up with his primary care doctor.  He is in agreement.  Given his CT scan showing.  Sinus thickness would likely sinusitis I will give him a round of azithromycin.  Recommend sinus rinse, salt water gargle.      Risk of Complications and/or Morbidity of Patient Management:  Prescription drug management performed.  Shared medical decision making was utilized in creating the patients health plan today.    ED Course as of 11/16/21 2044   Wynelle Link Nov 16, 2021   1905  EKG obtained interpreted by me.  Rate of 84.  Sinus rhythm.  Normal axis.  Normal interval.  No STEMI criteria noted [DT]      ED Course User Index  [DT] Sherron Mapp, Stevan Born, MD          History      HPI  46 male here with elevated blood pressure and intermittent dizziness.  Stated he was recently started on amlodipine 5 mg daily.  Started taking it about 4 days ago.  Still notices blood pressure is going up and down.  As high as 160 and 170s.  Stated about 45 minutes after taking medication he would feel somewhat lightheaded.  It would then resolve on his own after about 30 to 45 minutes.  During those episodes his blood pressure are not low.  He denies any chest pain, shortness of breath, unilateral weakness tingling or numbness.  No fever.  No loss of consciousness.  No syncope.  No speech difficulty.  Currently he feels well and has no symptoms  Review of Systems   Constitutional: Negative.  Negative for fever.   HENT: Negative.     Respiratory: Negative.     Cardiovascular: Negative.    Gastrointestinal: Negative.  Genitourinary: Negative.    Musculoskeletal:  Negative for back pain.   Skin: Negative.    Neurological:  Positive for dizziness. Negative for tremors, syncope, facial asymmetry, speech difficulty, weakness, light-headedness, numbness and headaches.       Physical Exam     Vitals signs and nursing note reviewed:  Vitals:    11/16/21 1736 11/16/21 1745 11/16/21 1830   BP: (!) 156/84  135/88   Pulse: 91  85   Resp: 18  20   Temp:  98.7 F (37.1 C) 98.8 F (37.1 C)   TempSrc:  Oral Oral   SpO2: 97%  95%   Weight:  90.7 kg (200 lb)    Height:  1.753 m (5\' 9" )       Physical Exam  Vitals and nursing note reviewed.   Constitutional:       General: He is not in acute distress.     Appearance: Normal appearance. He is not ill-appearing.   HENT:      Head: Normocephalic.      Mouth/Throat:      Mouth: Mucous membranes are moist.   Eyes:      Conjunctiva/sclera: Conjunctivae normal.   Cardiovascular:      Rate  and Rhythm: Normal rate.      Pulses: Normal pulses.      Heart sounds: Normal heart sounds. No murmur heard.  Pulmonary:      Effort: Pulmonary effort is normal. No respiratory distress.      Breath sounds: Normal breath sounds. No wheezing or rales.   Abdominal:      General: Abdomen is flat. Bowel sounds are normal. There is no distension.      Palpations: Abdomen is soft.      Tenderness: There is no abdominal tenderness.   Musculoskeletal:         General: No tenderness. Normal range of motion.      Cervical back: Normal range of motion and neck supple.   Skin:     General: Skin is warm.      Capillary Refill: Capillary refill takes less than 2 seconds.   Neurological:      General: No focal deficit present.      Mental Status: He is alert. Mental status is at baseline.      Cranial Nerves: No cranial nerve deficit.      Sensory: No sensory deficit.      Motor: No weakness.      Coordination: Coordination normal.          Procedures     Procedures    Orders Placed This Encounter   Procedures    XR CHEST (2 VW)    CT Head W/O Contrast    Basic Metabolic Panel    CBC with Auto Differential    Troponin T    Cardiac Monitor - ED Only    Pulse oximetry, continuous    EKG 12 Lead    Saline lock IV        Medications given during this emergency department visit:  Medications - No data to display    New Prescriptions    AZITHROMYCIN (ZITHROMAX) 250 MG TABLET    Take 1 tablet by mouth See Admin Instructions for 5 days 500mg  on day 1 followed by 250mg  on days 2 - 5        Past Medical History:   Diagnosis Date    BPH (benign prostatic hyperplasia)     Chronic low back pain  with right-sided sciatica     History of herpes genitalis     Internal hemorrhoids     Venous insufficiency of both lower extremities         Past Surgical History:   Procedure Laterality Date    CERVICAL FUSION      anterior cervical diskectomy / fusion C4-5, C5-6    HX OPEN REDUCTION INTERNAL FIXATION      femur    SEPTOPLASTY  1983        Social  History     Socioeconomic History    Marital status: Single     Spouse name: None    Number of children: None    Years of education: None    Highest education level: None   Tobacco Use    Smoking status: Never    Smokeless tobacco: Never   Substance and Sexual Activity    Alcohol use: Not Currently    Drug use: Never        Previous Medications    ACYCLOVIR (ZOVIRAX) 400 MG TABLET    Take 1 tablet by mouth in the morning and at bedtime    AMLODIPINE (NORVASC) 5 MG TABLET    Take 1 tablet by mouth daily    METHOCARBAMOL (ROBAXIN) 750 MG TABLET    Take 1 tablet by mouth 4 times daily    OMEPRAZOLE (PRILOSEC) 40 MG DELAYED RELEASE CAPSULE    Take 1 capsule by mouth daily    ONDANSETRON (ZOFRAN) 4 MG TABLET    Take 1 tablet by mouth 3 times daily as needed for Nausea or Vomiting        Results for orders placed or performed during the hospital encounter of 11/16/21   XR CHEST (2 VW)    Narrative    DATE: 11/16/2021    EXAMINATION: PA and lateral chest    COMPARISON: Prior examination of 11/14/2021    HISTORY: Pain.    FINDINGS:  Cardiac size is normal. No definitive acute infiltrate, effusion or pneumothorax   identified. Osseous thorax appears intact. 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    Redemonstrated on the lateral projection there is a rounded opacity overlying   the lower thoracic spine.      Impression    1. Rounded opacity overlying the lower thoracic spine on the lateral projection   is again seen. This may represent a lung nodule or possibly a spur off the   lateral aspect of the thoracic spine. Follow-up CT imaging is recommended for   further assessment.      Shea Stakesick Grzybowski, DO  Board Certified in Diagnostic Radiology and Vascular and Interventional   Radiology  [facility] [place] [place]  [email or web address]      Thank you for this referral. This exam was interpreted by a Board certified   fellowship trained radiologist. If the patient's healthcare provider has  any   questions, a [facility]ivers[facility] radiologist can be reached directly at [phone] at   any time.     Bing Neighborsichard Grzybowski, D.O.   11/16/2021 6:30:00 PM   CT Head W/O Contrast    Narrative    EXAMINATION: CT HEAD WO CONTRAST    DATE: 11/16/2021 7:29 PM     INDICATION: Male, 61 years old, dizziness.     COMPARISON: None available.     TECHNIQUE: Thin section noncontrast axial images were obtained through the head.   Coronal reformatted images were created. CT dose lowering techniques were used,  to include: automated exposure control, adjustment for patient size, and or use   of iterative reconstruction    FINDINGS:    Minimal/mild intracranial atrophy.    Minimal low attenuation is suggested within cerebral hemispheric white matter,   most noticeable within left frontal lobe.    No evidence of acute intracranial hemorrhage. No midline shift or focal mass   effect.    No displaced calvarial fracture deformity.    Mild partial opacification of paranasal sinuses, most noticeable within ethmoid   air cells and inferior maxillary sinuses.      Impression    Minimal/mild intracranial atrophy.    Minimal nonspecific low attenuation is suggested within cerebral hemispheric   white matter, most noticeable within left frontal lobe, perhaps due to   ischemia-age indeterminate.    No evidence of acute intracranial hemorrhage or mass effect.       James L. Burgess Amor, MD  Neuroradiologist  [facility]   [email or web address]      Thank you for this referral. This exam was interpreted by a fellowship trained   neuroradiologist with a Certificate of Added Qualification (CAQ). If the   patient's healthcare provider has any questions, a [facility] neuroradiologist   can be reached directly at [phone] at any time.    This report was created with voice recognition software. A reasonable attempt   was made to correct any misrecognized words and phrases.     Wanita Chamberlain, MD   11/16/2021 8:24:00 PM   Basic Metabolic Panel    Result Value Ref Range    Sodium 140 133 - 143 mmol/L    Potassium 4.3 3.5 - 5.1 mmol/L    Chloride 103 98 - 107 mmol/L    CO2 26 21 - 32 mmol/L    Anion Gap 11 2 - 11 mmol/L    Glucose 104 (H) 65 - 100 mg/dL    BUN 11 8 - 23 MG/DL    Creatinine 2.97 (L) 0.8 - 1.5 MG/DL    Est, Glom Filt Rate >60 >60 ml/min/1.35m2    Calcium 9.7 8.3 - 10.4 MG/DL   CBC with Auto Differential   Result Value Ref Range    WBC 8.9 4.3 - 11.1 K/uL    RBC 4.77 4.23 - 5.60 M/uL    Hemoglobin 15.7 13.6 - 17.2 g/dL    Hematocrit 98.9 21.1 - 50.3 %    MCV 95.8 82.0 - 102.0 FL    MCH 32.9 26.1 - 32.9 PG    MCHC 34.4 31.4 - 35.0 g/dL    RDW 94.1 74.0 - 81.4 %    Platelets 259 150 - 450 K/uL    MPV 8.9 (L) 9.4 - 12.3 FL    nRBC 0.00 0.0 - 0.2 K/uL    Differential Type AUTOMATED      Neutrophils % 69 43 - 78 %    Lymphocytes % 19 13 - 44 %    Monocytes % 10 4.0 - 12.0 %    Eosinophils % 1 0.5 - 7.8 %    Basophils % 1 0.0 - 2.0 %    Immature Granulocytes 1 0.0 - 5.0 %    Neutrophils Absolute 6.2 1.7 - 8.2 K/UL    Lymphocytes Absolute 1.7 0.5 - 4.6 K/UL    Monocytes Absolute 0.9 0.1 - 1.3 K/UL    Eosinophils Absolute 0.1 0.0 - 0.8 K/UL    Basophils Absolute 0.1 0.0 - 0.2 K/UL    Absolute Immature Granulocyte 0.1 0.0 -  0.5 K/UL   Troponin T   Result Value Ref Range    Troponin T 7.7 0 - 22 ng/L   Troponin T   Result Value Ref Range    Troponin T 7.9 0 - 22 ng/L   EKG 12 Lead   Result Value Ref Range    Ventricular Rate 84 BPM    Atrial Rate 83 BPM    P-R Interval 135 ms    QRS Duration 89 ms    Q-T Interval 361 ms    QTc Calculation (Bazett) 427 ms    P Axis 60 degrees    R Axis 52 degrees    T Axis 45 degrees    Diagnosis       Sinus rhythm  Abnormal R-wave progression, late transition          CT Head W/O Contrast   Final Result      Minimal/mild intracranial atrophy.      Minimal nonspecific low attenuation is suggested within cerebral hemispheric    white matter, most noticeable within left frontal lobe, perhaps due to    ischemia-age  indeterminate.      No evidence of acute intracranial hemorrhage or mass effect.          James L. Hali Marry, MD   Neuroradiologist   [facility]    [email or web address]         Thank you for this referral. This exam was interpreted by a fellowship trained    neuroradiologist with a Certificate of Added Qualification (CAQ). If the    patient's healthcare provider has any questions, a [facility] neuroradiologist    can be reached directly at [phone] at any time.      This report was created with voice recognition software. A reasonable attempt    was made to correct any misrecognized words and phrases.       Trixie Rude, MD    11/16/2021 8:24:00 PM      XR CHEST (2 VW)   Final Result   1. Rounded opacity overlying the lower thoracic spine on the lateral projection    is again seen. This may represent a lung nodule or possibly a spur off the    lateral aspect of the thoracic spine. Follow-up CT imaging is recommended for    further assessment.         Peggye Pitt, DO   Board Certified in [place] Radiology and Vascular and Interventional    Radiology   [facility] [place] [place]   [email or web address]         Thank you for this referral. This exam was interpreted by a Board certified    fellowship trained radiologist. If the patient's healthcare provider has any    questions, a [facility] radiologist can be reached directly at [phone] at    any time.       Tarri Fuller, D.O.    11/16/2021 6:30:00 PM                        Voice dictation software was used during the making of this note.  This software is not perfect and grammatical and other typographical errors may be present.  This note has not been completely proofread for errors.     Geanie Kenning, MD  11/16/21 2052

## 2021-11-16 NOTE — ED Triage Notes (Signed)
Pt states he has had some dizziness today, "woozy headedness" BP was elevated and HR elevated after taking at drug store today. States he took his norvasc Friday and started feeling dizzy after that.

## 2021-11-17 LAB — EKG 12-LEAD
Atrial Rate: 83 {beats}/min
P Axis: 60 degrees
P-R Interval: 135 ms
Q-T Interval: 361 ms
QRS Duration: 89 ms
QTc Calculation (Bazett): 427 ms
R Axis: 52 degrees
T Axis: 45 degrees
Ventricular Rate: 84 {beats}/min

## 2021-11-17 LAB — TROPONIN T: Troponin T: 7.9 ng/L (ref 0–22)

## 2021-11-17 MED ORDER — AZITHROMYCIN 250 MG PO TABS
250 MG | ORAL_TABLET | ORAL | 0 refills | Status: DC
Start: 2021-11-17 — End: 2021-11-16

## 2021-11-17 MED ORDER — AZITHROMYCIN 250 MG PO TABS
250 MG | ORAL_TABLET | ORAL | 0 refills | Status: AC
Start: 2021-11-17 — End: 2021-11-21

## 2021-11-17 NOTE — Telephone Encounter (Signed)
Patient requested appointment for dizziness. Possible effect from BP medication.     Mychart message sent to patient with Dr. Deborra Medina suggestion.

## 2021-11-18 MED ORDER — BISOPROLOL-HYDROCHLOROTHIAZIDE 2.5-6.25 MG PO TABS
2.5-6.25 MG | ORAL_TABLET | Freq: Every day | ORAL | 0 refills | Status: AC
Start: 2021-11-18 — End: 2021-11-19

## 2021-11-18 NOTE — Telephone Encounter (Signed)
Patient had an elevated blood pressure at the time of his last office visit on 11/12/21.  I initially elected to not initiate BP medications premature in case he was having white coat elevated blood pressure but the patient called back and stated that his home readings were similarly elevated so we placed him on amlodipine 5 mg daily.  He then called back stating that amlodipine made him dizzy after taking.  He was advised to stop taking amlodipine due to dizziness but now his BP remains elevated.  Let's try Ziac 2.5 / 6.25 mg one daily #30 with no refills.  Reassure patient this is the lowest dose (he was concerned that I gave him amlodipine 5 mg and not 2.5 mg dose) and may take a week or two to reach full effect.

## 2021-11-18 NOTE — Telephone Encounter (Signed)
From: Levert Feinstein  To: Dr. Nena Polio  Sent: 11/18/2021 4:27 AM EST  Subject: Blood pressure medication     My name is Jeffrey Wright DOB [birth year].Marland KitchenMarland KitchenI need to come in and see Dr Luana Shu concerning my blood pressure medication.Marland KitchenMarland KitchenI was told by a appointed Scheduler to hold my medicine and monitor my blood pressure.. I did and my blood pressure went up to 175/108 pulse 108.Marland KitchenMarland KitchenSo I took one of the blood pressure pills.Marland KitchenMarland KitchenI need to come in for an appointment.Marland KitchenMarland KitchenThe appointment Scheduler said the earliest I could get in to see Dr Luana Shu is December 4th I made the appointment but I NEED TO SEE DR. BAKER BEFORE DECEMBER 4TH...PLEASE SEE IF DR. BAKER CAN SEE ME BEFORE DECEMBER 4TH AND LET ME KNOW...MY PHONE NUMBER IS [phone] AND MY GIRLFRIEND'S NUMBER IS [phone].Marland KitchenMarland Kitchen

## 2021-11-18 NOTE — Telephone Encounter (Signed)
Requested Prescriptions     Pending Prescriptions Disp Refills    bisoprolol-hydroCHLOROthiazide (ZIAC) 2.5-6.25 MG per tablet 30 tablet 0     Sig: Take 1 tablet by mouth daily     To [facility].   Mychart message sent to patient.

## 2021-11-19 MED ORDER — AMLODIPINE BESYLATE 2.5 MG PO TABS
2.5 MG | ORAL_TABLET | Freq: Every day | ORAL | 0 refills | Status: DC
Start: 2021-11-19 — End: 2021-12-18

## 2021-11-19 NOTE — Addendum Note (Signed)
Addended by: Dayle Points on: 11/19/2021 08:35 AM     Modules accepted: Orders

## 2021-11-19 NOTE — Addendum Note (Signed)
Addended by: Dayle Points on: 11/19/2021 08:34 AM     Modules accepted: Orders

## 2021-11-19 NOTE — Telephone Encounter (Signed)
It seems patient would prefer amlodipine 2.5 mg daily.  DC Ziac.  DC amlodipine 5 mg daily.  Rx amlodipine 2.5 mg daily #30 x 0.  He may take it morning or evening.  Monitor home BP and call back with readings.

## 2021-11-19 NOTE — Telephone Encounter (Addendum)
Requested Prescriptions     Pending Prescriptions Disp Refills    amLODIPine (NORVASC) 2.5 MG tablet 30 tablet 0     Sig: Take 1 tablet by mouth daily         To [facility].   Mychart message sent to patient.

## 2021-11-21 NOTE — Telephone Encounter (Signed)
From: Levert Feinstein  To: Dr. Nena Polio  Sent: 11/21/2021 6:11 AM EST  Subject: Blood Pressure Medication     My name is Jeffrey Wright date of birth 14-Jul-1960. I have decided to keep taking the 5 mg Amlodipine Besylate because the 2.5 mg is not enough mg.Marland KitchenMarland KitchenI have a Scheduled appointment to see Dr. Luana Shu on December 4th and I will show up for this appointment if this is ok.Marland KitchenMarland KitchenI am originally from [facility] and do not know any other Primary Care providers.Marland KitchenMarland KitchenThe 5 mg of Amlodipine Besylate is better for me than the 2.5 mg.Marland KitchenMarland KitchenAs I mentioned before, this is the first time I have ever been Prescribed blood pressure medication in my life.Marland KitchenMarland KitchenSorry for any inconvenience caused by me...  Thank You.Marland KitchenMarland KitchenPlease inform Dr. Nena Polio and let him read my message.Marland KitchenMarland Kitchen

## 2021-12-08 ENCOUNTER — Encounter: Payer: MEDICAID | Attending: Internal Medicine | Primary: Diagnostic Radiology

## 2021-12-12 ENCOUNTER — Inpatient Hospital Stay: Admit: 2021-12-12 | Discharge: 2021-12-13 | Disposition: A | Discharge status: Home / Self Care | Payer: MEDICAID

## 2021-12-12 DIAGNOSIS — U071 COVID-19: Secondary | ICD-10-CM

## 2021-12-12 LAB — COMPREHENSIVE METABOLIC PANEL
ALT: 36 U/L (ref 13.0–61.0)
AST: 39 U/L — ABNORMAL HIGH (ref 15–37)
Albumin/Globulin Ratio: 1.3 (ref 0.4–1.6)
Albumin: 4.3 g/dL (ref 3.2–4.6)
Alk Phosphatase: 177 U/L — ABNORMAL HIGH (ref 45.0–117.0)
Anion Gap: 14 mmol/L — ABNORMAL HIGH (ref 2–11)
BUN: 10 MG/DL (ref 8–23)
CO2: 22 mmol/L (ref 21–32)
Calcium: 9.6 MG/DL (ref 8.3–10.4)
Chloride: 102 mmol/L (ref 98–107)
Creatinine: 0.79 MG/DL — ABNORMAL LOW (ref 0.8–1.5)
Est, Glom Filt Rate: >60 mL/min/{1.73_m2} (ref >60)
Globulin: 3.2 g/dL (ref 2.8–4.5)
Glucose: 100 mg/dL (ref 65–100)
Potassium: 4 mmol/L (ref 3.5–5.1)
Sodium: 138 mmol/L (ref 133–143)
Total Bilirubin: 0.4 MG/DL (ref 0.2–1.1)
Total Protein: 7.5 g/dL (ref 6.4–8.2)

## 2021-12-12 LAB — CBC WITH AUTO DIFFERENTIAL
Absolute Immature Granulocyte: 0 10*3/uL (ref 0.0–0.5)
Basophils %: 1 % (ref 0.0–2.0)
Basophils Absolute: 0 10*3/uL (ref 0.0–0.2)
Eosinophils %: 1 % (ref 0.5–7.8)
Eosinophils Absolute: 0 10*3/uL (ref 0.0–0.8)
Hematocrit: 43.3 % (ref 41.1–50.3)
Hemoglobin: 14.7 g/dL (ref 13.6–17.2)
Immature Granulocytes: 0 % (ref 0.0–5.0)
Lymphocytes %: 10 % — ABNORMAL LOW (ref 13–44)
Lymphocytes Absolute: 0.8 10*3/uL (ref 0.5–4.6)
MCH: 32.3 PG (ref 26.1–32.9)
MCHC: 33.9 g/dL (ref 31.4–35.0)
MCV: 95.2 FL (ref 82.0–102.0)
MPV: 8.9 FL — ABNORMAL LOW (ref 9.4–12.3)
Monocytes %: 16 % — ABNORMAL HIGH (ref 4.0–12.0)
Monocytes Absolute: 1.3 10*3/uL (ref 0.1–1.3)
Neutrophils %: 72 % (ref 43–78)
Neutrophils Absolute: 5.8 10*3/uL (ref 1.7–8.2)
Platelets: 238 10*3/uL (ref 150–450)
RBC: 4.55 M/uL (ref 4.23–5.60)
RDW: 12.9 % (ref 11.9–14.6)
WBC: 8.1 10*3/uL (ref 4.3–11.1)
nRBC: 0 10*3/uL (ref 0.0–0.2)

## 2021-12-12 LAB — COVID-19, RAPID: SARS-CoV-2, Rapid: DETECTED — AB

## 2021-12-12 LAB — PROCALCITONIN: Procalcitonin: 0.1 ng/mL (ref 0.00–0.49)

## 2021-12-12 LAB — LACTIC ACID: Lactic Acid: 1.2 mmol/L (ref 0.4–2.0)

## 2021-12-12 LAB — INFLUENZA A/B, MOLECULAR
Influenza A, NAA: NOT DETECTED
Influenza B, NAA: NOT DETECTED

## 2021-12-12 MED ORDER — PSEUDOEPH-BROMPHEN-DM 30-2-10 MG/5ML PO SYRP
2-30-105 MG/5ML | Freq: Four times a day (QID) | ORAL | 0 refills | Status: AC | PRN
Start: 2021-12-12 — End: 2021-12-19

## 2021-12-12 MED ORDER — PREDNISONE 50 MG PO TABS
50 MG | ORAL_TABLET | Freq: Every day | ORAL | 0 refills | Status: AC
Start: 2021-12-12 — End: 2021-12-17

## 2021-12-12 MED ORDER — KETOROLAC TROMETHAMINE 15 MG/ML IJ SOLN
15 MG/ML | Freq: Once | INTRAMUSCULAR | Status: AC
Start: 2021-12-12 — End: 2021-12-12
  Administered 2021-12-12: 23:00:00 15 mg via INTRAVENOUS

## 2021-12-12 MED ORDER — STERILE WATER FOR INJECTION (MIXTURES ONLY)
1 g | Freq: Once | INTRAMUSCULAR | Status: AC
Start: 2021-12-12 — End: 2021-12-12
  Administered 2021-12-12: 23:00:00 1000 mg via INTRAVENOUS

## 2021-12-12 MED ORDER — SODIUM CHLORIDE 0.9 % IV BOLUS
0.9 % | INTRAVENOUS | Status: AC
Start: 2021-12-12 — End: 2021-12-12
  Administered 2021-12-12: 23:00:00 1000 mL via INTRAVENOUS

## 2021-12-12 MED FILL — KETOROLAC TROMETHAMINE 15 MG/ML IJ SOLN: 15 MG/ML | INTRAMUSCULAR | Qty: 1

## 2021-12-12 MED FILL — CEFTRIAXONE SODIUM 1 G IJ SOLR: 1 g | INTRAMUSCULAR | Qty: 1000

## 2021-12-12 NOTE — ED Notes (Signed)
Blood Culture drawn and 2nd staff member assisted (audited) Jule Ser, RN.  Clinical collect stickers were printed prior to scanning and administering antibiotics.       Geanie Logan, RN  12/12/21 1826

## 2021-12-12 NOTE — ED Triage Notes (Signed)
Pt presents ambulatory to Triage with complaints of a fever and productive cough that is light brown. Pt reports fever began last night, pt reports having a cough for a time prior to that. A&O x4

## 2021-12-12 NOTE — ED Provider Notes (Signed)
Emergency Department Provider Note       PCP: No, Pcp   Age: 61 y.o.   Sex: male     DISPOSITION Decision To Discharge 12/12/2021 06:55:12 PM       ICD-10-CM    1. COVID-19  U07.1           Medical Decision Making     Complexity of Problems Addressed:  1 acute illness with systemic symptoms    Data Reviewed and Analyzed:  I independently ordered and reviewed each unique test.     The patients assessment required an independent historian: Patient significant other.  The reason they were needed is important historical information not provided by the patient.        Discussion of management or test interpretation.  In summary this is a 61 year old male patient presented for evaluation of symptoms most consistent with viral respiratory syndrome secondary to COVID.  Initially came in with multiple URI symptoms but was slightly tachycardic so septic workup was initiated as a precaution.  White blood cell count was normal and he does not meet SIRS criteria for sepsis.  We did bolus him with fluids and his pulse completely normalized.  His lungs are clear and I am not suspecting bacterial pneumonia superinfection.  Plan for this patient be outpatient therapy.  Recommend hydration.  Steroids and cough syrup given.  Counseled on signs to return for.  Patient has verbalized understanding and agreed to the plan.  Discharged in stable condition.      Risk of Complications and/or Morbidity of Patient Management:  Prescription drug management performed.  Patient was discharged risks and benefits of hospitalization were considered.  Shared medical decision making was utilized in creating the patients health plan today.  Considerations: The following items were considered but not ordered: antibiotics.     ED attending physician present in department at time of care. Based on current hospital policy, their co-signature is not required on this note.     History       61 year old male patient with history of BPH presents today  complaining of productive cough ongoing for the past 3 days.  Describes it as constant and with light brownish sputum.  States he is also had fevers and sinus congestion.  Describes symptoms as mild to moderate in severity.  No exacerbating or relieving factors.  Does report that his girlfriend is sick with similar symptoms.  Denies chest pain or shortness of breath or pleuritic pain or hemoptysis.  Denies trismus or drooling or voice changes or neck stiffness or swelling or difficulty swallowing.  Patient is primary historian.    The history is provided by the patient and a significant other. No language interpreter was used.        Review of Systems   Constitutional:  Positive for fever.   HENT:  Positive for congestion. Negative for facial swelling and trouble swallowing.    Eyes:  Negative for photophobia and visual disturbance.   Respiratory:  Positive for cough. Negative for shortness of breath.    Cardiovascular:  Negative for chest pain.   Gastrointestinal:  Negative for abdominal pain and vomiting.   Genitourinary:  Negative for dysuria.   Musculoskeletal:  Negative for myalgias and neck stiffness.   Skin:  Negative for rash and wound.   Neurological:  Negative for dizziness, syncope, weakness and light-headedness.   Psychiatric/Behavioral:  Negative for self-injury and suicidal ideas.    All other systems reviewed and are negative.  Physical Exam     Vitals signs and nursing note reviewed:  Vitals:    12/12/21 1742 12/12/21 1854   BP: 138/87    Pulse: (!) 109 88   Resp: 19    Temp: 98.4 F (36.9 C)    TempSrc: Oral    SpO2: 98%    Weight: 88 kg (194 lb)    Height: 1.753 m (_0 )       Physical Exam  Vitals and nursing note reviewed.   Constitutional:       General: He is not in acute distress.     Appearance: Normal appearance. He is not ill-appearing, toxic-appearing or diaphoretic.      Comments: Overall well-appearing nontoxic male in no acute distress.  Ambulatory.  Pleasant and cooperative.   Speaks in full sentences.  Even nonlabored respirations   HENT:      Head: Normocephalic and atraumatic.      Right Ear: Tympanic membrane, ear canal and external ear normal.      Left Ear: Tympanic membrane, ear canal and external ear normal.      Nose: Congestion present. No rhinorrhea.      Mouth/Throat:      Mouth: Mucous membranes are moist.      Pharynx: Oropharynx is clear. No oropharyngeal exudate or posterior oropharyngeal erythema.   Eyes:      General: No scleral icterus.        Right eye: No discharge.         Left eye: No discharge.      Conjunctiva/sclera: Conjunctivae normal.   Cardiovascular:      Rate and Rhythm: Regular rhythm. Tachycardia present.      Pulses: Normal pulses.      Heart sounds: Normal heart sounds. No murmur heard.  Pulmonary:      Effort: Pulmonary effort is normal. No respiratory distress.      Breath sounds: Normal breath sounds. No stridor. No wheezing, rhonchi or rales.      Comments: Lungs clear throughout with equal chest expansion  Musculoskeletal:         General: Normal range of motion.      Cervical back: Normal range of motion and neck supple. No rigidity or tenderness.   Lymphadenopathy:      Cervical: Cervical adenopathy present.   Skin:     General: Skin is warm and dry.      Capillary Refill: Capillary refill takes less than 2 seconds.      Coloration: Skin is not jaundiced.   Neurological:      General: No focal deficit present.      Mental Status: He is alert. Mental status is at baseline.          Procedures     Procedures    Orders Placed This Encounter   Procedures    Blood Culture 1    COVID-19, Rapid    Influenza A/B, Molecular    Comprehensive Metabolic Panel    CBC with Auto Differential    Procalcitonin    Lactic Acid    Strict intake and output    Saline lock IV        Medications given during this emergency department visit:  Medications   sodium chloride 0.9 % bolus 1,000 mL (1,000 mLs IntraVENous New Bag 12/12/21 1824)   cefTRIAXone (ROCEPHIN) 1,000 mg  in sterile water 10 mL IV syringe (1,000 mg IntraVENous Given 12/12/21 1827)   ketorolac (TORADOL) injection 15 mg (15 mg  IntraVENous Given 12/12/21 1824)       New Prescriptions    BROMPHENIRAMINE-PSEUDOEPHEDRINE-DM 2-30-10 MG/5ML SYRUP    Take 5 mLs by mouth 4 times daily as needed for Cough    PREDNISONE (DELTASONE) 50 MG TABLET    Take 1 tablet by mouth daily for 5 days        Past Medical History:   Diagnosis Date    BPH (benign prostatic hyperplasia)     Chronic low back pain with right-sided sciatica     History of herpes genitalis     Internal hemorrhoids     Venous insufficiency of both lower extremities         Past Surgical History:   Procedure Laterality Date    CERVICAL FUSION      anterior cervical diskectomy / fusion C4-5, C5-6    HX OPEN REDUCTION INTERNAL FIXATION      femur    SEPTOPLASTY  1983        Social History     Socioeconomic History    Marital status: Single     Spouse name: None    Number of children: None    Years of education: None    Highest education level: None   Tobacco Use    Smoking status: Never    Smokeless tobacco: Never   Substance and Sexual Activity    Alcohol use: Not Currently    Drug use: Never        Previous Medications    ACYCLOVIR (ZOVIRAX) 400 MG TABLET    Take 1 tablet by mouth in the morning and at bedtime    AMLODIPINE (NORVASC) 2.5 MG TABLET    Take 1 tablet by mouth daily    AMLODIPINE (NORVASC) 5 MG TABLET    Take 1 tablet by mouth daily    METHOCARBAMOL (ROBAXIN) 750 MG TABLET    Take 1 tablet by mouth 4 times daily    OMEPRAZOLE (PRILOSEC) 40 MG DELAYED RELEASE CAPSULE    Take 1 capsule by mouth daily    ONDANSETRON (ZOFRAN) 4 MG TABLET    Take 1 tablet by mouth 3 times daily as needed for Nausea or Vomiting        Results for orders placed or performed during the hospital encounter of 12/12/21   COVID-19, Rapid    Specimen: Nasopharyngeal   Result Value Ref Range    Source Nasopharyngeal      SARS-CoV-2, Rapid Detected (A) NOTD     Influenza A/B, Molecular     Specimen: Nasopharyngeal   Result Value Ref Range    Influenza A, NAA Not detected NOTD      Influenza B, NAA Not detected NOTD     Comprehensive Metabolic Panel   Result Value Ref Range    Sodium 138 133 - 143 mmol/L    Potassium 4.0 3.5 - 5.1 mmol/L    Chloride 102 98 - 107 mmol/L    CO2 22 21 - 32 mmol/L    Anion Gap 14 (H) 2 - 11 mmol/L    Glucose 100 65 - 100 mg/dL    BUN 10 8 - 23 MG/DL    Creatinine 0.79 (L) 0.8 - 1.5 MG/DL    Est, Glom Filt Rate >60 >60 ml/min/1.52m    Calcium 9.6 8.3 - 10.4 MG/DL    Total Bilirubin 0.4 0.2 - 1.1 MG/DL    ALT 36 13.0 - 61.0 U/L    AST 39 (H) 15 - 37 U/L  Alk Phosphatase 177 (H) 45.0 - 117.0 U/L    Total Protein 7.5 6.4 - 8.2 g/dL    Albumin 4.3 3.2 - 4.6 g/dL    Globulin 3.2 2.8 - 4.5 g/dL    Albumin/Globulin Ratio 1.3 0.4 - 1.6     CBC with Auto Differential   Result Value Ref Range    WBC 8.1 4.3 - 11.1 K/uL    RBC 4.55 4.23 - 5.60 M/uL    Hemoglobin 14.7 13.6 - 17.2 g/dL    Hematocrit 43.3 41.1 - 50.3 %    MCV 95.2 82.0 - 102.0 FL    MCH 32.3 26.1 - 32.9 PG    MCHC 33.9 31.4 - 35.0 g/dL    RDW 12.9 11.9 - 14.6 %    Platelets 238 150 - 450 K/uL    MPV 8.9 (L) 9.4 - 12.3 FL    nRBC 0.00 0.0 - 0.2 K/uL    Differential Type AUTOMATED      Neutrophils % 72 43 - 78 %    Lymphocytes % 10 (L) 13 - 44 %    Monocytes % 16 (H) 4.0 - 12.0 %    Eosinophils % 1 0.5 - 7.8 %    Basophils % 1 0.0 - 2.0 %    Immature Granulocytes 0 0.0 - 5.0 %    Neutrophils Absolute 5.8 1.7 - 8.2 K/UL    Lymphocytes Absolute 0.8 0.5 - 4.6 K/UL    Monocytes Absolute 1.3 0.1 - 1.3 K/UL    Eosinophils Absolute 0.0 0.0 - 0.8 K/UL    Basophils Absolute 0.0 0.0 - 0.2 K/UL    Absolute Immature Granulocyte 0.0 0.0 - 0.5 K/UL   Procalcitonin   Result Value Ref Range    Procalcitonin 0.10 0.00 - 0.49 ng/mL   Lactic Acid   Result Value Ref Range    Lactic Acid 1.20 0.4 - 2.0 mmol/L        No orders to display                     Voice dictation software was used during the making of this note.  This software is  not perfect and grammatical and other typographical errors may be present.  This note has not been completely proofread for errors.     Gwenith Daily, [place]  12/12/21 1857

## 2021-12-12 NOTE — Discharge Instructions (Signed)
You have been diagnosed with covid.     Use steroids for the full 5 days to relieve your symptoms.    Utilize prescribed cough medication as needed for cough.    I recommend alternating tylenol and advil every 4 hours for pain, fever, bodyaches.  Use cough drops, warm salt water gargles for sore throat.  Use flonase nasal spray for congestion.  Drink plenty of fluids and do not allow yourself to get dehydrated.  Continue to monitor your symptoms and return for any new or worsening symptoms.  Otherwise please follow up with your primary care provider.    As we discussed, I did not find a life threatening cause of your symptoms today. However, THAT DOES NOT MEAN IT COULD NOT DEVELOP. If you develop ANY new or worsening symptoms, it is critical that you return for re-evaluation. This includes any symptoms that are concerning to you, especially symptoms such as fevers, chest pain, trouble breathing.  If you do not return for re-evaluation, you risk serious complications, including death.

## 2021-12-12 NOTE — ED Notes (Signed)
I have reviewed discharge instructions with the patient and spouse.  The patient and spouse verbalized understanding.    Patient left ED via Discharge Method: ambulatory to Home with spouse.    Opportunity for questions and clarification provided.       Patient given 2 scripts.         To continue your aftercare when you leave the hospital, you may receive an automated call from our care team to check in on how you are doing.  This is a free service and part of our promise to provide the best care and service to meet your aftercare needs." If you have questions, or wish to unsubscribe from this service please call [phone].  Thank you for Choosing our [facility] Emergency Department.        Geanie Logan, RN  12/12/21 [phone]

## 2021-12-13 ENCOUNTER — Inpatient Hospital Stay: Admit: 2021-12-13 | Discharge: 2021-12-13 | Disposition: A | Discharge status: Home / Self Care | Payer: MEDICAID

## 2021-12-13 DIAGNOSIS — U071 COVID-19: Secondary | ICD-10-CM

## 2021-12-13 MED ORDER — ONDANSETRON HCL 4 MG PO TABS
4 MG | ORAL_TABLET | Freq: Every day | ORAL | 0 refills | Status: AC | PRN
Start: 2021-12-13 — End: 2021-12-17

## 2021-12-13 MED ORDER — ONDANSETRON 4 MG PO TBDP
4 MG | ORAL | Status: AC
Start: 2021-12-13 — End: 2021-12-13
  Administered 2021-12-13: 21:00:00 4 mg via ORAL

## 2021-12-13 MED FILL — ONDANSETRON 4 MG PO TBDP: 4 MG | ORAL | Qty: 1

## 2021-12-13 NOTE — ED Triage Notes (Signed)
Pt ambulatory to triage with steady gait. Pt c/o continued nausea and fever. Pt asking for prescription for toradol and something for nausea. Pt with known COVID. Pt in NAD during triage.

## 2021-12-13 NOTE — Discharge Instructions (Addendum)
Use the Zofran as neededd for nausea. Drink plenty of fluids.  Alternate tyleenol and motrin as needed for body aches and fevers.     If your symptoms change or worsen in any way, return to the ER immediately.     We would love to help you get a primary care doctor for follow-up after your emergency department visit.    Please call [phone] between 7AM - 6PM Monday to Friday.  A care navigator will be able to assist you with setting up a doctor close to your home.

## 2021-12-13 NOTE — ED Provider Notes (Signed)
Emergency Department Provider Note       PCP: No, Pcp   Age: 61 y.o.   Sex: male     DISPOSITION Discharge - Pending Orders Complete 12/13/2021 04:01:38 PM       ICD-10-CM    1. COVID  U07.1       2. Nausea  R11.0           Medical Decision Making     Complexity of Problems Addressed:  1 or more acute illnesses that pose a threat to life or bodily function.     Data Reviewed and Analyzed:   I independently ordered and reviewed each unique test.     Discussion of management or test interpretation.  This patient is a 61 year old male who tested positive for COVID-19 yesterday who presents today as he is feeling slightly nauseous.  Patient presents in no acute distress.  His vitals are stable and within normal limits.  He is afebrile and not hypoxic.  Patient states that he was diagnosed with COVID-19 yesterday and is requesting Zofran prescription as he has been nauseous.  He denies chest pain, shortness of breath, abdominal pain, headache, dizziness.  He is going to the bathroom is normal.  He has had some intermittent fevers or chills but he is vitally stable here today.  Zofran given to the patient here today.  Prescription sent to his pharmacy.  Strict return precautions given.  Patient verbalized understanding and agreement with the plan.    Risk of Complications and/or Morbidity of Patient Management:  Prescription drug management performed.  Shared medical decision making was utilized in creating the patients health plan today.         Is this patient to be included in the SEP-1 core measure due to severe sepsis or septic shock? No Exclusion criteria - the patient is NOT to be included for SEP-1 Core Measure due to: Viral etiology found or highly suspected (including COVID-19) without concomitant bacterial infection      History      This patient is a 61 year old male who tested positive for COVID-19 yesterday who presents today as he is feeling slightly nauseous.  He states that he has not vomited but he  would like something for nausea.  He had Zofran at home but it was outdated so he did not take it.  He has had fevers and chills and body aches.  He denies all other symptoms including chest pain, shortness of breath, abdominal pain, or any other symptoms at this time.  He is requesting a Zofran prescription to go home with.    The history is provided by the patient. No language interpreter was used.        Review of Systems   Constitutional:  Positive for chills, fatigue and fever.   HENT:  Negative for congestion.    Eyes:  Negative for visual disturbance.   Respiratory:  Negative for cough and shortness of breath.    Cardiovascular:  Negative for chest pain.   Gastrointestinal:  Positive for nausea. Negative for abdominal pain, constipation and vomiting.   Genitourinary:  Negative for dysuria, flank pain and hematuria.   Neurological:  Negative for headaches.   All other systems reviewed and are negative.      Physical Exam     Vitals signs and nursing note reviewed:  Vitals:    12/13/21 1402   BP: 130/87   Pulse: 98   Resp: 19   Temp: 98.8 F (37.1 C)  TempSrc: Oral   SpO2: 95%   Weight: 88 kg (194 lb)   Height: 1.753 m (5\' 9" )      Physical Exam  Vitals and nursing note reviewed.   Constitutional:       General: He is not in acute distress.     Appearance: Normal appearance. He is obese. He is not ill-appearing, toxic-appearing or diaphoretic.   HENT:      Head: Normocephalic and atraumatic.      Right Ear: Tympanic membrane, ear canal and external ear normal. There is no impacted cerumen.      Left Ear: Tympanic membrane, ear canal and external ear normal. There is no impacted cerumen.      Ears:      Comments: No pain or edema to mastoids bilaterally.     Nose: Nose normal.      Mouth/Throat:      Mouth: Mucous membranes are moist.      Pharynx: Oropharynx is clear. No oropharyngeal exudate or posterior oropharyngeal erythema.      Comments: Uvula is midline.  Tolerating normal oral secretions.  No  sublingual or submandibular fullness.  Eyes:      General:         Right eye: No discharge.         Left eye: No discharge.      Extraocular Movements: Extraocular movements intact.      Conjunctiva/sclera: Conjunctivae normal.      Pupils: Pupils are equal, round, and reactive to light.   Neck:      Comments: Normal range of motion of head and neck.  No meningeal signs.  Cardiovascular:      Rate and Rhythm: Normal rate and regular rhythm.      Pulses: Normal pulses.      Heart sounds: Normal heart sounds. No murmur heard.     No friction rub. No gallop.   Pulmonary:      Effort: Pulmonary effort is normal. No respiratory distress.      Breath sounds: Normal breath sounds. No stridor. No wheezing, rhonchi or rales.   Chest:      Chest wall: No tenderness.   Abdominal:      General: Abdomen is flat. There is no distension.      Palpations: Abdomen is soft.      Tenderness: There is no abdominal tenderness. There is no right CVA tenderness, left CVA tenderness or guarding.   Musculoskeletal:         General: Normal range of motion.      Cervical back: Normal range of motion.   Skin:     General: Skin is warm and dry.      Capillary Refill: Capillary refill takes less than 2 seconds.   Neurological:      General: No focal deficit present.      Mental Status: He is alert and oriented to person, place, and time. Mental status is at baseline.   Psychiatric:         Mood and Affect: Mood normal.         Behavior: Behavior normal.         Thought Content: Thought content normal.         Judgment: Judgment normal.          Procedures     Procedures    No orders of the defined types were placed in this encounter.       Medications given during this emergency department  visit:  Medications   ondansetron (ZOFRAN-ODT) disintegrating tablet 4 mg (has no administration in time range)       New Prescriptions    ONDANSETRON (ZOFRAN) 4 MG TABLET    Take 1 tablet by mouth daily as needed for Nausea or Vomiting        Past Medical  History:   Diagnosis Date    BPH (benign prostatic hyperplasia)     Chronic low back pain with right-sided sciatica     History of herpes genitalis     Internal hemorrhoids     Venous insufficiency of both lower extremities         Past Surgical History:   Procedure Laterality Date    CERVICAL FUSION      anterior cervical diskectomy / fusion C4-5, C5-6    HX OPEN REDUCTION INTERNAL FIXATION      femur    SEPTOPLASTY  1983        Social History     Socioeconomic History    Marital status: Single     Spouse name: None    Number of children: None    Years of education: None    Highest education level: None   Tobacco Use    Smoking status: Never    Smokeless tobacco: Never   Substance and Sexual Activity    Alcohol use: Not Currently    Drug use: Never        Previous Medications    ACYCLOVIR (ZOVIRAX) 400 MG TABLET    Take 1 tablet by mouth in the morning and at bedtime    AMLODIPINE (NORVASC) 2.5 MG TABLET    Take 1 tablet by mouth daily    AMLODIPINE (NORVASC) 5 MG TABLET    Take 1 tablet by mouth daily    BROMPHENIRAMINE-PSEUDOEPHEDRINE-DM 2-30-10 MG/5ML SYRUP    Take 5 mLs by mouth 4 times daily as needed for Cough    METHOCARBAMOL (ROBAXIN) 750 MG TABLET    Take 1 tablet by mouth 4 times daily    OMEPRAZOLE (PRILOSEC) 40 MG DELAYED RELEASE CAPSULE    Take 1 capsule by mouth daily    PREDNISONE (DELTASONE) 50 MG TABLET    Take 1 tablet by mouth daily for 5 days        No results found for any visits on 12/13/21.     No orders to display                     Voice dictation software was used during the making of this note.  This software is not perfect and grammatical and other typographical errors may be present.  This note has not been completely proofread for errors.        Yetta Flock, [place]  12/13/21 1615

## 2021-12-13 NOTE — ED Notes (Signed)
I have reviewed discharge instructions with the patient.  The patient verbalized understanding.    Patient left ED via Discharge Method: ambulatory to Home with family    Opportunity for questions and clarification provided.       Patient given 1 scripts.         To continue your aftercare when you leave the hospital, you may receive an automated call from our care team to check in on how you are doing.  This is a free service and part of our promise to provide the best care and service to meet your aftercare needs." If you have questions, or wish to unsubscribe from this service please call [phone].  Thank you for Choosing our [facility] Emergency Department.       Katrinka Blazing, RN  12/13/21 [phone]

## 2021-12-13 NOTE — ED Notes (Signed)
I have reviewed discharge instructions with the patient.  The patient verbalized understanding.    Patient left ED via Discharge Method: ambulatory to Home with family    Opportunity for questions and clarification provided.       Patient given 1 scripts.         To continue your aftercare when you leave the hospital, you may receive an automated call from our care team to check in on how you are doing.  This is a free service and part of our promise to provide the best care and service to meet your aftercare needs." If you have questions, or wish to unsubscribe from this service please call [phone].  Thank you for Choosing our [facility] Emergency Department.       Francia Greaves, RN  12/13/21 [phone]

## 2021-12-17 ENCOUNTER — Inpatient Hospital Stay
Admit: 2021-12-17 | Discharge: 2021-12-17 | Disposition: A | Discharge status: Home / Self Care | Payer: MEDICAID | Attending: Emergency Medicine

## 2021-12-17 ENCOUNTER — Emergency Department: Admit: 2021-12-17 | Payer: MEDICAID

## 2021-12-17 DIAGNOSIS — U071 COVID-19: Secondary | ICD-10-CM

## 2021-12-17 DIAGNOSIS — R059 Cough, unspecified: Secondary | ICD-10-CM

## 2021-12-17 MED ORDER — ONDANSETRON 8 MG PO TBDP
8 MG | ORAL_TABLET | Freq: Three times a day (TID) | ORAL | 0 refills | Status: AC | PRN
Start: 2021-12-17 — End: ?

## 2021-12-17 NOTE — ED Provider Notes (Incomplete)
Emergency Department Provider Note       PCP: No, Pcp   Age: 61 y.o.   Sex: male     DISPOSITION Decision To Discharge 12/17/2021 06:34:43 PM       ICD-10-CM    1. Cough, unspecified type  R05.9       2. COVID-19  U07.1           Medical Decision Making     Complexity of Problems Addressed:  Complexity of Problem: 1 acute, uncomplicated illness or injury.    Data Reviewed and Analyzed:  I independently ordered and reviewed each unique test.  I reviewed external records: ED visit note from an outside group.  I reviewed external records: previous lab results from outside ED.  I reviewed external records: previous imaging study including radiologist interpretation.   {Historian (state who, why needed, what they said):56592}    I interpreted the X-rays.  No pneumonia    Discussion of management or test interpretation.  Will obtain chest x-ray here to rule out any signs of bacterial pneumonia.       6:44 PM EST  Chest or x-ray shows no lobar infiltrate.  He is not hypoxic.  Will discharge home.  Discussed further outpatient treatment.    Risk of Complications and/or Morbidity of Patient Management:  OTC drug management performed, Prescription drug management performed, and Shared medical decision making was utilized in creating the patients health plan today.    History      Patient presents the ER with complaints of continued cough, fever and malaise.  Patient with remote diagnosis of COVID-19.  States she was on steroids.  States that steroids are finished but he continues to have symptoms.  States cough is mostly nonproductive.  Reports nausea without any vomiting.    The history is provided by the patient.   Cough  Cough characteristics:  Non-productive  Sputum characteristics:  Nondescript  Severity:  Moderate  Onset quality:  Gradual  Duration:  6 days  Progression:  Worsening  Chronicity:  New  Context: not animal exposure, not occupational exposure and not sick contacts    Relieved by:  Nothing  Worsened by:   Nothing  Associated symptoms: fever and myalgias    Associated symptoms: no sinus congestion and no sore throat         Physical Exam     Vitals signs and nursing note reviewed.   Vitals:    12/17/21 1726   BP: 124/71   Pulse: 94   Resp: 17   Temp: 99 F (37.2 C)   TempSrc: Oral   SpO2: 95%   Weight: 88 kg (194 lb)   Height: 1.753 m (5\' 9" )       Physical Exam  Vitals and nursing note reviewed.   Constitutional:       Appearance: Normal appearance.   HENT:      Head: Normocephalic and atraumatic.      Right Ear: External ear normal.      Left Ear: External ear normal.      Nose: Nose normal. No congestion or rhinorrhea.   Eyes:      General:         Right eye: No discharge.         Left eye: No discharge.      Extraocular Movements: Extraocular movements intact.      Pupils: Pupils are equal, round, and reactive to light.   Cardiovascular:      Rate and  Rhythm: Normal rate and regular rhythm.      Pulses: Normal pulses.      Heart sounds: Normal heart sounds.   Pulmonary:      Effort: Pulmonary effort is normal.      Breath sounds: Normal breath sounds.   Abdominal:      General: Abdomen is flat. There is no distension.      Palpations: There is no mass.   Musculoskeletal:         General: No swelling, tenderness, deformity or signs of injury. Normal range of motion.   Neurological:      General: No focal deficit present.      Mental Status: He is alert and oriented to person, place, and time.      Cranial Nerves: No cranial nerve deficit.      Sensory: No sensory deficit.   Psychiatric:         Mood and Affect: Mood normal.         Behavior: Behavior normal.          Procedures     Procedures     Orders Placed This Encounter   Procedures    XR CHEST (2 VW)        Medications given during this emergency department visit:  Medications - No data to display    New Prescriptions    ONDANSETRON (ZOFRAN-ODT) 8 MG TBDP DISINTEGRATING TABLET    Place 1 tablet under the tongue every 8 hours as needed for Nausea or Vomiting         Past Medical History:   Diagnosis Date    BPH (benign prostatic hyperplasia)     Chronic low back pain with right-sided sciatica     History of herpes genitalis     Internal hemorrhoids     Venous insufficiency of both lower extremities         Past Surgical History:   Procedure Laterality Date    CERVICAL FUSION      anterior cervical diskectomy / fusion C4-5, C5-6    HX OPEN REDUCTION INTERNAL FIXATION      femur    SEPTOPLASTY  1983        Results for orders placed or performed during the hospital encounter of 12/17/21   XR CHEST (2 VW)    Narrative    Chest X-ray    INDICATION: Cough    COMPARISON: 11/16/2021    PA and lateral views of the chest were obtained.    FINDINGS: The lungs are clear. There are no infiltrates or effusions.  The heart  size is normal.  The bony thorax is intact.        Impression    No radiographic evidence of acute cardiopulmonary disease          XR CHEST (2 VW)   Final Result   No radiographic evidence of acute cardiopulmonary disease               Voice dictation software was used during the making of this note.  This software is not perfect and grammatical and other typographical errors may be present.  This note has not been completely proofread for errors.

## 2021-12-17 NOTE — Discharge Instructions (Addendum)
Take Tylenol 1000 mg or ibuprofen 800 mg as needed for fever or body aches  You Do not need any antibiotics or steroids at this time  Continue with cough medication  You have been given a prescription for nausea medicine which increasing the dose to help with your symptoms  Follow-up with your primary care physician  Return to the ER for any new, worsening or life-threatening symptoms

## 2021-12-17 NOTE — ED Triage Notes (Signed)
Pt was diagnosed with covid 5 days ago and put on prednisone. States he is still having fever and nausea. Patient is not answering RN questions and states "I just need to see a doctor and get more prednisone."

## 2021-12-17 NOTE — ED Notes (Signed)
I have reviewed discharge instructions with the patient.  The patient verbalized understanding.    Patient left ED via Discharge Method: ambulatory to Home with spouse.    Opportunity for questions and clarification provided.       Patient given 1 scripts.         To continue your aftercare when you leave the hospital, you may receive an automated call from our care team to check in on how you are doing.  This is a free service and part of our promise to provide the best care and service to meet your aftercare needs." If you have questions, or wish to unsubscribe from this service please call [phone].  Thank you for Choosing our [facility] Emergency Department.       Cheron Schaumann, RN  12/17/21 [phone]

## 2021-12-18 ENCOUNTER — Encounter

## 2021-12-18 LAB — CULTURE, BLOOD 1: Culture: NO GROWTH

## 2021-12-18 MED ORDER — AMLODIPINE BESYLATE 5 MG PO TABS
5 MG | ORAL_TABLET | Freq: Every day | ORAL | 0 refills | Status: AC
Start: 2021-12-18 — End: ?

## 2021-12-18 NOTE — Telephone Encounter (Signed)
Requested Prescriptions     Pending Prescriptions Disp Refills    amLODIPine (NORVASC) 5 MG tablet 30 tablet 0     Sig: Take 1 tablet by mouth daily     To [facility] 30 day supply only.

## 2021-12-19 ENCOUNTER — Emergency Department: Payer: MEDICAID

## 2021-12-19 ENCOUNTER — Emergency Department: Admit: 2021-12-19 | Payer: MEDICAID

## 2021-12-19 ENCOUNTER — Inpatient Hospital Stay
Admit: 2021-12-19 | Discharge: 2021-12-20 | Disposition: A | Discharge status: Other Acute Inpatient Hospital | Payer: MEDICAID | Attending: Emergency Medicine

## 2021-12-19 DIAGNOSIS — R7989 Other specified abnormal findings of blood chemistry: Secondary | ICD-10-CM

## 2021-12-19 DIAGNOSIS — U071 COVID-19: Secondary | ICD-10-CM

## 2021-12-19 LAB — CBC WITH AUTO DIFFERENTIAL
Absolute Immature Granulocyte: 0 10*3/uL (ref 0.0–0.5)
Basophils %: 0 % (ref 0.0–2.0)
Basophils Absolute: 0 10*3/uL (ref 0.0–0.2)
Eosinophils %: 0 % — ABNORMAL LOW (ref 0.5–7.8)
Eosinophils Absolute: 0 10*3/uL (ref 0.0–0.8)
Hematocrit: 43.6 % (ref 41.1–50.3)
Hemoglobin: 14.8 g/dL (ref 13.6–17.2)
Immature Granulocytes: 0 % (ref 0.0–5.0)
Lymphocytes %: 20 % (ref 13–44)
Lymphocytes Absolute: 1 10*3/uL (ref 0.5–4.6)
MCH: 32.2 PG (ref 26.1–32.9)
MCHC: 33.9 g/dL (ref 31.4–35.0)
MCV: 94.8 FL (ref 82.0–102.0)
MPV: 9.1 FL — ABNORMAL LOW (ref 9.4–12.3)
Monocytes %: 12 % (ref 4.0–12.0)
Monocytes Absolute: 0.6 10*3/uL (ref 0.1–1.3)
Neutrophils %: 68 % (ref 43–78)
Neutrophils Absolute: 3.3 10*3/uL (ref 1.7–8.2)
Platelets: 161 10*3/uL (ref 150–450)
RBC: 4.6 M/uL (ref 4.23–5.60)
RDW: 13 % (ref 11.9–14.6)
WBC: 4.9 10*3/uL (ref 4.3–11.1)
nRBC: 0 10*3/uL (ref 0.0–0.2)

## 2021-12-19 MED ORDER — DEXAMETHASONE SODIUM PHOSPHATE 10 MG/ML IJ SOLN
10 MG/ML | Freq: Once | INTRAMUSCULAR | Status: AC
Start: 2021-12-19 — End: 2021-12-19
  Administered 2021-12-19: 10 mg via INTRAVENOUS

## 2021-12-19 MED ORDER — SODIUM CHLORIDE 0.9 % IV BOLUS
0.9 % | INTRAVENOUS | Status: AC
Start: 2021-12-19 — End: 2021-12-19
  Administered 2021-12-19: 1000 mL via INTRAVENOUS

## 2021-12-19 MED ORDER — KETOROLAC TROMETHAMINE 15 MG/ML IJ SOLN
15 MG/ML | Freq: Once | INTRAMUSCULAR | Status: AC
Start: 2021-12-19 — End: 2021-12-19
  Administered 2021-12-19: 15 mg via INTRAVENOUS

## 2021-12-19 MED ORDER — CEFTRIAXONE SODIUM 1 G IJ SOLR
1 g | INTRAMUSCULAR | Status: AC
Start: 2021-12-19 — End: 2021-12-19
  Administered 2021-12-20: 01:00:00 1000 mg via INTRAVENOUS

## 2021-12-19 MED ORDER — ONDANSETRON HCL 4 MG/2ML IJ SOLN
4 MG/2ML | INTRAMUSCULAR | Status: AC
Start: 2021-12-19 — End: 2021-12-19
  Administered 2021-12-19: 4 mg via INTRAVENOUS

## 2021-12-19 MED FILL — DEXAMETHASONE SODIUM PHOSPHATE 10 MG/ML IJ SOLN: 10 MG/ML | INTRAMUSCULAR | Qty: 1

## 2021-12-19 MED FILL — KETOROLAC TROMETHAMINE 15 MG/ML IJ SOLN: 15 MG/ML | INTRAMUSCULAR | Qty: 1

## 2021-12-19 MED FILL — ONDANSETRON HCL 4 MG/2ML IJ SOLN: 4 MG/2ML | INTRAMUSCULAR | Qty: 2

## 2021-12-19 NOTE — ACP (Advance Care Planning) (Cosign Needed)
[employer] Hospitalist Service  At the heart of better care     Advance Care Planning   Admit Date:  12/19/2021 10:43 PM   Name:  Jeffrey Wright   Age:  61 y.o.  Sex:  male  DOB:  June 09, 1960   MRN:  [id number]   Room:  823/01    Hillary Schwegler has capacity to make his own decisions:   Yes    If pt unable to make decisions, POA/surrogate decision maker:  N/A    Other people present:   None    Patient / surrogate decision-maker directed code status:  Full Code    Other ACP topics discussed, if applicable:   ABT.    Patient or surrogate consented to discussion of the current conditions, workup, management plans, prognosis, and the risk for further deterioration.  Time spent: 30 minutes in direct discussion.      Signed:  Vira Agar, APRN - CNP

## 2021-12-19 NOTE — ED Provider Notes (Signed)
Emergency Department Provider Note       PCP: No, Pcp   Age: 61 y.o.   Sex: male     DISPOSITION Decision To Transfer 12/19/2021 09:13:31 PM       ICD-10-CM    1. COVID  U07.1       2. Pneumonia of both lungs due to infectious organism, unspecified part of lung  J18.9           Medical Decision Making     Complexity of Problems Addressed:  1 or more acute illnesses that pose a threat to life or bodily function.     Data Reviewed and Analyzed:   I independently ordered and reviewed each unique test.         I independently ordered and interpreted the ED EKG in the absence of a Cardiologist.    Rate: 91  EKG Interpretation: EKG Interpretation: sinus rhythm, no evidence of arrhythmia  ST Segments: Normal ST segments - NO STEMI          Discussion of management or test interpretation.  In summary this is a 61 year old male patient presented for reevaluation of worsening symptoms of COVID-19.  This is now his fourth visit for the same complaint.  He was initially mildly tachycardic and tachypneic with oxygen less than 94% upon arrival.  Workup was initiated which was with negative troponin, normal white count.  D-dimer was elevated.  Procalcitonin negative.  Chest x-ray shows possible developing pneumonia.  Unfortunately we are unable to CT scan the patient due to contrast allergy so we will need to get a VQ scan.  Given that this is the patient's fourth return visit and his symptoms are worsening, I do think he should be admitted.  [place] where we can also do VQ scan him for pulmonary embolism.  Consulted hospitalist for admission.  Patient admitted.  Patient history, ROS, physical exam, labs, radiological workup and all pertinent findings were discussed with attending Dr. Katherine Basset.   The patient was admitted and I have discussed patient management with the admitting provider.  The management of this patient was discussed with an external consultant.      Risk of Complications  and/or Morbidity of Patient Management:  Discussion with external consultants.  Chronic medical problems impacting care include hyperlipidemia.  Shared medical decision making was utilized in creating the patients health plan today.    ED Course as of 12/19/21 2126   Fri Dec 19, 2021   1825 6:25 PM  Pneumonia on xray [NR]   1925 7:25 PM  [name] age-adjusted D-dimer is elevated.  Will scan for PE. [NR]   2047 8:47 PM  Patient dropped to low normal oxygen with ambulation.  Will consult hospitalist for admission. Sent Dr. Concha Se a message for admission.  [NR]      ED Course User Index  [NR] Gwenith Daily, [place]       Is this patient to be included in the SEP-1 core measure due to severe sepsis or septic shock? No Exclusion criteria - the patient is NOT to be included for SEP-1 Core Measure due to: Viral etiology found or highly suspected (including COVID-19) without concomitant bacterial infection      History      61 year old male patient with history of hyperlipidemia presents today for reevaluation of COVID.  Reports he was diagnosed 1 week ago and this is now his fourth visit for the same complaint.  Reports since being  diagnosed 1 week ago, he is continue to have coughing, nausea, fevers, fatigue.  Reports when he takes a deep breath he coughs.  Denies any chest pain or shortness of breath or pleuritic pain or hemoptysis.  Denies trismus or drooling or voice changes or difficulty swallowing.  Patient is primary historian.    The history is provided by the patient. No language interpreter was used.        Review of Systems   Constitutional:  Positive for fatigue and fever.   HENT:  Positive for congestion. Negative for facial swelling and trouble swallowing.    Eyes:  Negative for photophobia and visual disturbance.   Respiratory:  Positive for cough. Negative for shortness of breath.    Cardiovascular:  Negative for chest pain.   Gastrointestinal:  Positive for nausea. Negative for abdominal pain  and vomiting.   Genitourinary:  Negative for dysuria.   Musculoskeletal:  Negative for myalgias and neck stiffness.   Skin:  Negative for rash and wound.   Neurological:  Negative for dizziness, syncope, weakness and light-headedness.   Psychiatric/Behavioral:  Negative for self-injury and suicidal ideas.    All other systems reviewed and are negative.      Physical Exam     Vitals signs and nursing note reviewed:  Vitals:    12/19/21 1654 12/19/21 2001   BP: 112/76    Pulse: (!) 105 97   Resp: 20    Temp: 99.7 F (37.6 C)    TempSrc: Oral    SpO2: 94%    Weight: 86.2 kg (190 lb)    Height: 1.753 m (_0 )       Physical Exam  Vitals and nursing note reviewed.   Constitutional:       General: He is not in acute distress.     Appearance: Normal appearance. He is not ill-appearing, toxic-appearing or diaphoretic.      Comments: Ambulatory well-hydrated.  No acute distress.  Speaks in full sentences.  Even nonlabored respirations.   HENT:      Head: Normocephalic and atraumatic.      Right Ear: Tympanic membrane, ear canal and external ear normal.      Left Ear: Tympanic membrane, ear canal and external ear normal.      Nose: Congestion present.      Mouth/Throat:      Mouth: Mucous membranes are moist.      Pharynx: Oropharynx is clear. No oropharyngeal exudate or posterior oropharyngeal erythema.   Eyes:      General: No scleral icterus.        Right eye: No discharge.         Left eye: No discharge.      Conjunctiva/sclera: Conjunctivae normal.   Cardiovascular:      Rate and Rhythm: Normal rate and regular rhythm.      Pulses: Normal pulses.      Heart sounds: Normal heart sounds. No murmur heard.     Comments: Normal heart rate at time of exam  Pulmonary:      Effort: Pulmonary effort is normal. No respiratory distress.      Breath sounds: Normal breath sounds. No stridor. No wheezing, rhonchi or rales.   Musculoskeletal:         General: No tenderness. Normal range of motion.      Cervical back: Normal range of  motion and neck supple.      Right lower leg: No edema.      Left lower  leg: No edema.   Skin:     General: Skin is warm and dry.      Capillary Refill: Capillary refill takes less than 2 seconds.      Coloration: Skin is not jaundiced.   Neurological:      General: No focal deficit present.      Mental Status: He is alert and oriented to person, place, and time. Mental status is at baseline.          Procedures     Procedures    Orders Placed This Encounter   Procedures    Blood Culture 1    XR CHEST (2 VW)    CBC with Auto Differential    CMP    D-Dimer, Quantitative    Troponin T    Procalcitonin    Check Pulse Oximetry while ambulating    EKG 12 Lead        Medications given during this emergency department visit:  Medications   iopamidol (ISOVUE-370) 76 % injection 100 mL (has no administration in time range)   sodium chloride 0.9 % bolus 1,000 mL (0 mLs IntraVENous Stopped 12/19/21 1911)   dexamethasone (DECADRON) injection 10 mg (10 mg IntraVENous Given 12/19/21 1843)   ketorolac (TORADOL) injection 15 mg (15 mg IntraVENous Given 12/19/21 1842)   ondansetron (ZOFRAN) injection 4 mg (4 mg IntraVENous Given 12/19/21 1841)   cefTRIAXone (ROCEPHIN) 1,000 mg in sterile water 10 mL IV syringe (1,000 mg IntraVENous Given 12/19/21 1951)       New Prescriptions    No medications on file        Past Medical History:   Diagnosis Date    BPH (benign prostatic hyperplasia)     Chronic low back pain with right-sided sciatica     History of herpes genitalis     Internal hemorrhoids     Venous insufficiency of both lower extremities         Past Surgical History:   Procedure Laterality Date    CERVICAL FUSION      anterior cervical diskectomy / fusion C4-5, C5-6    HX OPEN REDUCTION INTERNAL FIXATION      femur    SEPTOPLASTY  1983        Social History     Socioeconomic History    Marital status: Single     Spouse name: None    Number of children: None    Years of education: None    Highest education level: None   Tobacco  Use    Smoking status: Never    Smokeless tobacco: Never   Substance and Sexual Activity    Alcohol use: Not Currently    Drug use: Never        Previous Medications    ACYCLOVIR (ZOVIRAX) 400 MG TABLET    Take 1 tablet by mouth in the morning and at bedtime    AMLODIPINE (NORVASC) 5 MG TABLET    Take 1 tablet by mouth daily    BROMPHENIRAMINE-PSEUDOEPHEDRINE-DM 2-30-10 MG/5ML SYRUP    Take 5 mLs by mouth 4 times daily as needed for Cough    METHOCARBAMOL (ROBAXIN) 750 MG TABLET    Take 1 tablet by mouth 4 times daily    OMEPRAZOLE (PRILOSEC) 40 MG DELAYED RELEASE CAPSULE    Take 1 capsule by mouth daily    ONDANSETRON (ZOFRAN-ODT) 8 MG TBDP DISINTEGRATING TABLET    Place 1 tablet under the tongue every 8 hours as needed for Nausea or  Vomiting        Results for orders placed or performed during the hospital encounter of 12/19/21   XR CHEST (2 VW)    Narrative    Chest X-ray    INDICATION: COVID cough    PA and lateral views of the chest were obtained.    FINDINGS: Bibasilar opacities are concerning for pneumonia.  The heart size is  normal.  The bony thorax is intact.        Impression    Findings concerning for bibasilar pneumonia.     CBC with Auto Differential   Result Value Ref Range    WBC 4.9 4.3 - 11.1 K/uL    RBC 4.60 4.23 - 5.60 M/uL    Hemoglobin 14.8 13.6 - 17.2 g/dL    Hematocrit 43.6 41.1 - 50.3 %    MCV 94.8 82.0 - 102.0 FL    MCH 32.2 26.1 - 32.9 PG    MCHC 33.9 31.4 - 35.0 g/dL    RDW 13.0 11.9 - 14.6 %    Platelets 161 150 - 450 K/uL    MPV 9.1 (L) 9.4 - 12.3 FL    nRBC 0.00 0.0 - 0.2 K/uL    Differential Type AUTOMATED      Neutrophils % 68 43 - 78 %    Lymphocytes % 20 13 - 44 %    Monocytes % 12 4.0 - 12.0 %    Eosinophils % 0 (L) 0.5 - 7.8 %    Basophils % 0 0.0 - 2.0 %    Immature Granulocytes 0 0.0 - 5.0 %    Neutrophils Absolute 3.3 1.7 - 8.2 K/UL    Lymphocytes Absolute 1.0 0.5 - 4.6 K/UL    Monocytes Absolute 0.6 0.1 - 1.3 K/UL    Eosinophils Absolute 0.0 0.0 - 0.8 K/UL    Basophils  Absolute 0.0 0.0 - 0.2 K/UL    Absolute Immature Granulocyte 0.0 0.0 - 0.5 K/UL   CMP   Result Value Ref Range    Sodium 137 133 - 143 mmol/L    Potassium 4.1 3.5 - 5.1 mmol/L    Chloride 100 98 - 107 mmol/L    CO2 22 21 - 32 mmol/L    Anion Gap 15 (H) 2 - 11 mmol/L    Glucose 92 65 - 100 mg/dL    BUN 17 8 - 23 MG/DL    Creatinine 0.92 0.8 - 1.5 MG/DL    Est, Glom Filt Rate >60 >60 ml/min/1.11m    Calcium 9.1 8.3 - 10.4 MG/DL    Total Bilirubin 0.3 0.2 - 1.1 MG/DL    ALT 24 13.0 - 61.0 U/L    AST 54 (H) 15 - 37 U/L    Alk Phosphatase 208 (H) 45.0 - 117.0 U/L    Total Protein 7.1 6.4 - 8.2 g/dL    Albumin 3.4 3.2 - 4.6 g/dL    Globulin 3.7 2.8 - 4.5 g/dL    Albumin/Globulin Ratio 0.9 0.4 - 1.6     D-Dimer, Quantitative   Result Value Ref Range    D-Dimer, Quant 0.72 (H) <0.56 ug/ml(FEU)   Troponin T   Result Value Ref Range    Troponin T 10.4 0 - 22 ng/L   Procalcitonin   Result Value Ref Range    Procalcitonin 0.07 0.00 - 0.49 ng/mL        XR CHEST (2 VW)   Final Result   Findings concerning for bibasilar pneumonia.  Voice dictation software was used during the making of this note.  This software is not perfect and grammatical and other typographical errors may be present.  This note has not been completely proofread for errors.     Gwenith Daily, [place]  12/19/21 2126

## 2021-12-19 NOTE — H&P (Signed)
Hospitalist History and Physical   Admit Date:  12/19/2021 10:43 PM   Name:  Jeffrey Wright   Age:  61 y.o.  Sex:  male  DOB:  01-11-1960   MRN:  [id number]   Room:  823/01    Presenting/Chief Complaint: No chief complaint on file.     Reason(s) for Admission: Acute pneumonia [J18.9]     History of Present Illness:   Jeffrey Wright is a 61 y.o. male with medical history of HTN, HLD, GERD who presented with  continued cough, fever and malaise. Dx with COVID 12/8. Continues returning to ED since that time with co symptoms related to COVID-12/8, 12/9, 12/13, 12/15.  Prior to that he was seen in ED 11/10 and 11/12 for other co.  He had a PCP but states was dismissed related to not showing up to appt.     He moved from [facility] and lives in [place] with significant other, states was seeing pain management but dismissed for unknown reason, stated takes Hydrocodone 10 mg but has not had in "7-8 months". Prescribed Robaxin by PCP.  Chart reviewed, per PCP note 11/8"  "61 year male who presents today for delayed followup.  He was last seen on 07/07/21 at which time I ordered labs and a lumbar MRI and and abdominal US.  He was noncompliant with labs and MRI and ultrasound.  He was to have returned to see me in 08/2021 but was noncompliant with follow up.  He failed to keep many office visits between 08/2021 and now.  Today he presents with numerous requests. "    In ED today D dimer noted 0.72, sats mid to high 90s on RA, no CP. Does endorse cough with nausea. Afebrile, no leukocytosis, renal function normal limits.           Assessment & Plan:     Principal Problem:    Pneumonia  Plan: Rocephin, Zithromax  On RA  Check PNA labs  Trend BC    Active Problems:  Chronic back pain  Plan: was seeing pain management but dismissed for unknown reason, stated takes Hydrocodone 10 mg but has not had in "7-8 months". Prescribed Robaxin by PCP.  I have placed outpatient referral for pain management  and ordered Robaxin, I have  told patient chronic pain issues would be addressed outpatient.          GERD (gastroesophageal reflux disease)  Plan: PPI    Elevated Alk phos  Plan  [place] ordered outpt that patient was non compliant in getting  Monitor and address outpt, no abd pain      Hypertension, essential  Plan: Norvasc resumed      Hyperlipidemia, mixed  Plan: diet controlled      Medically noncompliant  Plan: education  He states needing PCP but I do not see where Dr. Luana Shu dismissed him from his practice, instead, patient has been non compliant with SEVERAL things regarding his care  *in fact, 12/14 Dr. Luana Shu was renewing Norvasc 12/14 and instructed him to go to ED if feeling poorly from [place]: noted  Dx 12/8      Elevated d-dimer  Plan: states allergy to contrast dye, "my throat felt like it was closing" when given early 2000s  VQ scan  Doppler bilat legs      BPH (benign prostatic hyperplasia)  Plan: does not take meds        PT/OT evals and PPD  needed/ordered?  Yes  Diet: ADULT DIET; Regular  VTE prophylaxis: Lovenox  Code status: Full Code      Non-peripheral Lines and Tubes (if present):             Hospital Problems:  Principal Problem:    Pneumonia  Active Problems:    GERD (gastroesophageal reflux disease)    Hypertension, essential    Hyperlipidemia, mixed    Medically noncompliant    COVID    Elevated d-dimer    BPH (benign prostatic hyperplasia)    Acute pneumonia  Resolved Problems:    * No resolved hospital problems. *      Past History:     Past Medical History:   Diagnosis Date    BPH (benign prostatic hyperplasia)     Chronic low back pain with right-sided sciatica     History of herpes genitalis     Internal hemorrhoids     Venous insufficiency of both lower extremities        Past Surgical History:   Procedure Laterality Date    CERVICAL FUSION      anterior cervical diskectomy / fusion C4-5, C5-6    HX OPEN REDUCTION INTERNAL FIXATION      femur    SEPTOPLASTY  1983        Social History     Tobacco Use     Smoking status: Never    Smokeless tobacco: Never   Substance Use Topics    Alcohol use: Not Currently      Social History     Substance and Sexual Activity   Drug Use Never       Family History   Problem Relation Age of Onset    No Known Problems Mother     No Known Problems Father           There is no immunization history on file for this patient.  Allergies   Allergen Reactions    Amoxicillin Rash    Codeine Nausea And Vomiting     Prior to Admit Medications:  Current Outpatient Medications   Medication Instructions    acyclovir (ZOVIRAX) 400 mg, Oral, 2 times daily    amLODIPine (NORVASC) 5 mg, Oral, DAILY    brompheniramine-pseudoephedrine-DM 2-30-10 MG/5ML syrup 5 mLs, Oral, 4 TIMES DAILY PRN    methocarbamol (ROBAXIN) 750 mg, Oral, 4 TIMES DAILY    omeprazole (PRILOSEC) 40 mg, Oral, DAILY    ondansetron (ZOFRAN-ODT) 8 mg, SubLINGual, EVERY 8 HOURS PRN         Objective:   Patient Vitals for the past 24 hrs:   Temp Pulse Resp BP SpO2   12/19/21 2308 99 F (37.2 C) 91 19 125/76 94 %       Oxygen Therapy  SpO2: 94 %    Estimated body mass index is 27.67 kg/m as calculated from the following:    Height as of this encounter: 1.753 m (5' 9").    Weight as of this encounter: 85 kg (187 lb 6.3 oz).  No intake or output data in the 24 hours ending 12/19/21 2326      Physical Exam:    General:    Well nourished.    Head:  Normocephalic, atraumatic  Eyes:  Sclerae appear normal.  Pupils equally round.  ENT:  Nares appear normal.  Moist oral mucosa  Neck:  No restricted ROM.  Trachea midline   CV:   RRR.  No m/r/g.  No jugular venous distension.  Lungs:   CTAB.  No wheezing, rhonchi, or rales.  Symmetric expansion.  Abdomen:   Soft, nontender, nondistended.  Extremities: No cyanosis or clubbing.  No edema  Skin:     No rashes and normal coloration.   Warm and dry.    Neuro:  CN II-XII grossly intact.  Sensation intact.   Psych:  Normal mood and flat affect.      Orders Placed This Encounter   Medications     cefTRIAXone (ROCEPHIN) 1,000 mg in sterile water 10 mL IV syringe     Order Specific Question:   Antimicrobial Indications     Answer:   Pneumonia (CAP)     Order Specific Question:   CAP duration of therapy     Answer:   5 days    azithromycin (ZITHROMAX) 500 mg in sodium chloride 0.9 % 250 mL IVPB (Vial2Bag)     Order Specific Question:   Antimicrobial Indications     Answer:   Pneumonia (CAP)     Order Specific Question:   CAP duration of therapy     Answer:   5 days    sodium chloride flush 0.9 % injection 5-40 mL    sodium chloride flush 0.9 % injection 5-40 mL    0.9 % sodium chloride infusion    OR Linked Order Group     potassium chloride (KLOR-CON M) extended release tablet 40 mEq     potassium bicarb-citric acid (EFFER-K) effervescent tablet 40 mEq     potassium chloride 10 mEq/100 mL IVPB (Peripheral Line)    magnesium sulfate 2000 mg in 50 mL IVPB premix    enoxaparin (LOVENOX) injection 40 mg     Order Specific Question:   Indication of Use     Answer:   Prophylaxis-DVT/PE    OR Linked Order Group     ondansetron (ZOFRAN-ODT) disintegrating tablet 4 mg     ondansetron (ZOFRAN) injection 4 mg    polyethylene glycol (GLYCOLAX) packet 17 g    OR Linked Order Group     acetaminophen (TYLENOL) tablet 650 mg     acetaminophen (TYLENOL) suppository 650 mg    lactated ringers IV soln infusion    melatonin tablet 3 mg    famotidine (PEPCID) tablet 20 mg    amLODIPine (NORVASC) tablet 5 mg    methocarbamol (ROBAXIN) tablet 750 mg       I have personally reviewed labs and tests:  Recent Labs:  Recent Results (from the past 24 hour(s))   CBC with Auto Differential    Collection Time: 12/19/21  6:28 PM   Result Value Ref Range    WBC 4.9 4.3 - 11.1 K/uL    RBC 4.60 4.23 - 5.60 M/uL    Hemoglobin 14.8 13.6 - 17.2 g/dL    Hematocrit 43.6 41.1 - 50.3 %    MCV 94.8 82.0 - 102.0 FL    MCH 32.2 26.1 - 32.9 PG    MCHC 33.9 31.4 - 35.0 g/dL    RDW 13.0 11.9 - 14.6 %    Platelets 161 150 - 450 K/uL    MPV 9.1 (L) 9.4 - 12.3  FL    nRBC 0.00 0.0 - 0.2 K/uL    Differential Type AUTOMATED      Neutrophils % 68 43 - 78 %    Lymphocytes % 20 13 - 44 %    Monocytes % 12 4.0 - 12.0 %    Eosinophils % 0 (L) 0.5 - 7.8 %  Basophils % 0 0.0 - 2.0 %    Immature Granulocytes 0 0.0 - 5.0 %    Neutrophils Absolute 3.3 1.7 - 8.2 K/UL    Lymphocytes Absolute 1.0 0.5 - 4.6 K/UL    Monocytes Absolute 0.6 0.1 - 1.3 K/UL    Eosinophils Absolute 0.0 0.0 - 0.8 K/UL    Basophils Absolute 0.0 0.0 - 0.2 K/UL    Absolute Immature Granulocyte 0.0 0.0 - 0.5 K/UL   CMP    Collection Time: 12/19/21  6:28 PM   Result Value Ref Range    Sodium 137 133 - 143 mmol/L    Potassium 4.1 3.5 - 5.1 mmol/L    Chloride 100 98 - 107 mmol/L    CO2 22 21 - 32 mmol/L    Anion Gap 15 (H) 2 - 11 mmol/L    Glucose 92 65 - 100 mg/dL    BUN 17 8 - 23 MG/DL    Creatinine 0.92 0.8 - 1.5 MG/DL    Est, Glom Filt Rate >60 >60 ml/min/1.76m    Calcium 9.1 8.3 - 10.4 MG/DL    Total Bilirubin 0.3 0.2 - 1.1 MG/DL    ALT 24 13.0 - 61.0 U/L    AST 54 (H) 15 - 37 U/L    Alk Phosphatase 208 (H) 45.0 - 117.0 U/L    Total Protein 7.1 6.4 - 8.2 g/dL    Albumin 3.4 3.2 - 4.6 g/dL    Globulin 3.7 2.8 - 4.5 g/dL    Albumin/Globulin Ratio 0.9 0.4 - 1.6     D-Dimer, Quantitative    Collection Time: 12/19/21  6:28 PM   Result Value Ref Range    D-Dimer, Quant 0.72 (H) <0.56 ug/ml(FEU)   Troponin T    Collection Time: 12/19/21  6:28 PM   Result Value Ref Range    Troponin T 10.4 0 - 22 ng/L   Procalcitonin    Collection Time: 12/19/21  6:28 PM   Result Value Ref Range    Procalcitonin 0.07 0.00 - 0.49 ng/mL   EKG 12 Lead    Collection Time: 12/19/21  9:23 PM   Result Value Ref Range    Ventricular Rate 91 BPM    Atrial Rate 91 BPM    P-R Interval 124 ms    QRS Duration 89 ms    Q-T Interval 341 ms    QTc Calculation (Bazett) 420 ms    P Axis 73 degrees    R Axis 88 degrees    T Axis 15 degrees    Diagnosis Sinus rhythm  Borderline right axis deviation          I have personally reviewed imaging studies:  XR  CHEST (2 VW)    Result Date: 12/19/2021  Chest X-ray INDICATION: COVID cough PA and lateral views of the chest were obtained. FINDINGS: Bibasilar opacities are concerning for pneumonia.  The heart size is normal.  The bony thorax is intact.      Findings concerning for bibasilar pneumonia.         Signed:  KVira Agar APRN - CNP

## 2021-12-19 NOTE — ED Notes (Signed)
Pt presents to the ED for c/o generalized fatigue, cough.fevers and over all body aches.  Dx with COVID 1 week ago     Kerri Perches, [place]  12/19/21 2012

## 2021-12-19 NOTE — ED Triage Notes (Signed)
Patient ambulatory with wife in triage with complaint of worsening symptoms of Covid, diagnosed last Friday. He's waking up coughing, having nausea, fever, and generalized fatigue.

## 2021-12-19 NOTE — ED Notes (Addendum)
Blood Culture drawn and 2nd staff member assisted Kathlee Nations, Therapist, sports.  Clinical collect stickers were printed prior to scanning and administering antibiotics.  Drawn per Jonelle Sidle, [place], Poydras, RN  12/19/21 2017       Kerri Perches, RN  12/19/21 2018

## 2021-12-19 NOTE — ED Notes (Signed)
TRANSFER - OUT REPORT:    Verbal report given to Phillips on Jeffrey Wright  being transferred to DT 823 for routine progression of patient care       Report consisted of patient's Situation, Background, Assessment and   Recommendations(SBAR).     Information from the following report(s) Nurse Handoff Report was reviewed with the receiving nurse.    Lines:   Peripheral IV 12/19/21 Right Antecubital (Active)       Peripheral IV 12/19/21 Left;Dorsal Wrist (Active)        Opportunity for questions and clarification was provided.      Patient transported with:  Monitor      Eula Listen, RN  12/19/21 2208

## 2021-12-19 NOTE — ED Notes (Signed)
Report given to Kelly, RN     Anthonymichael Munday, RN  12/19/21 2131

## 2021-12-19 NOTE — Progress Notes (Signed)
TRANSFER - IN REPORT:    Verbal report received from [place], RN on Jeffrey Wright  being received from [facility] ED for routine progression of patient care      Report consisted of patient's Situation, Background, Assessment and   Recommendations(SBAR).     Information from the following report(s) Nurse Handoff Report, Intake/Output, MAR, and Recent Results was reviewed with the receiving nurse.    Opportunity for questions and clarification was provided.      Assessment completed upon patient's arrival to unit and care assume

## 2021-12-19 NOTE — ED Notes (Signed)
Ambulated pt in the hall per order.  O2 sats were approx 92-93 % during ambulation     Kerri Perches, [place]  12/19/21 2049

## 2021-12-20 ENCOUNTER — Inpatient Hospital Stay: Admit: 2021-12-20 | Payer: MEDICAID

## 2021-12-20 ENCOUNTER — Inpatient Hospital Stay
Admit: 2021-12-20 | Discharge: 2021-12-25 | Disposition: A | Discharge status: Home / Self Care | Payer: MEDICAID | Attending: Internal Medicine | Admitting: Internal Medicine

## 2021-12-20 LAB — COMPREHENSIVE METABOLIC PANEL
ALT: 24 U/L (ref 13.0–61.0)
AST: 54 U/L — ABNORMAL HIGH (ref 15–37)
Albumin/Globulin Ratio: 0.9 (ref 0.4–1.6)
Albumin: 3.4 g/dL (ref 3.2–4.6)
Alk Phosphatase: 208 U/L — ABNORMAL HIGH (ref 45.0–117.0)
Anion Gap: 15 mmol/L — ABNORMAL HIGH (ref 2–11)
BUN: 17 MG/DL (ref 8–23)
CO2: 22 mmol/L (ref 21–32)
Calcium: 9.1 MG/DL (ref 8.3–10.4)
Chloride: 100 mmol/L (ref 98–107)
Creatinine: 0.92 MG/DL (ref 0.8–1.5)
Est, Glom Filt Rate: >60 mL/min/{1.73_m2} (ref >60)
Globulin: 3.7 g/dL (ref 2.8–4.5)
Glucose: 92 mg/dL (ref 65–100)
Potassium: 4.1 mmol/L (ref 3.5–5.1)
Sodium: 137 mmol/L (ref 133–143)
Total Bilirubin: 0.3 MG/DL (ref 0.2–1.1)
Total Protein: 7.1 g/dL (ref 6.4–8.2)

## 2021-12-20 LAB — CBC WITH AUTO DIFFERENTIAL
Absolute Immature Granulocyte: 0 10*3/uL (ref 0.0–0.5)
Basophils %: 0 % (ref 0.0–2.0)
Basophils Absolute: 0 10*3/uL (ref 0.0–0.2)
Eosinophils %: 0 % — ABNORMAL LOW (ref 0.5–7.8)
Eosinophils Absolute: 0 10*3/uL (ref 0.0–0.8)
Hematocrit: 42.2 % (ref 41.1–50.3)
Hemoglobin: 13.8 g/dL (ref 13.6–17.2)
Immature Granulocytes: 1 % (ref 0.0–5.0)
Lymphocytes %: 21 % (ref 13–44)
Lymphocytes Absolute: 0.7 10*3/uL (ref 0.5–4.6)
MCH: 31.7 PG (ref 26.1–32.9)
MCHC: 32.7 g/dL (ref 31.4–35.0)
MCV: 96.8 FL (ref 82–102)
MPV: 10 FL (ref 9.4–12.3)
Monocytes %: 14 % — ABNORMAL HIGH (ref 4.0–12.0)
Monocytes Absolute: 0.5 10*3/uL (ref 0.1–1.3)
Neutrophils %: 64 % (ref 43–78)
Neutrophils Absolute: 2.2 10*3/uL (ref 1.7–8.2)
Platelets: 206 10*3/uL (ref 150–450)
RBC: 4.36 M/uL (ref 4.23–5.6)
RDW: 12.9 % (ref 11.9–14.6)
WBC: 3.5 10*3/uL — ABNORMAL LOW (ref 4.3–11.1)
nRBC: 0 10*3/uL (ref 0.0–0.2)

## 2021-12-20 LAB — BASIC METABOLIC PANEL W/ REFLEX TO MG FOR LOW K
Anion Gap: 7 mmol/L (ref 2–11)
BUN: 15 MG/DL (ref 8–23)
CO2: 26 mmol/L (ref 21–32)
Calcium: 8.4 MG/DL (ref 8.3–10.4)
Chloride: 107 mmol/L (ref 103–113)
Creatinine: 0.8 MG/DL (ref 0.8–1.5)
Est, Glom Filt Rate: >60 mL/min/{1.73_m2} (ref >60)
Glucose: 106 mg/dL — ABNORMAL HIGH (ref 65–100)
Potassium: 4.4 mmol/L (ref 3.5–5.1)
Sodium: 140 mmol/L (ref 136–146)

## 2021-12-20 LAB — HEPATIC FUNCTION PANEL
ALT: 30 U/L (ref 12–65)
AST: 47 U/L — ABNORMAL HIGH (ref 15–37)
Albumin/Globulin Ratio: 0.8 (ref 0.4–1.6)
Albumin: 2.8 g/dL — ABNORMAL LOW (ref 3.2–4.6)
Alk Phosphatase: 190 U/L — ABNORMAL HIGH (ref 50–136)
Bilirubin, Direct: <0.1 MG/DL (ref <0.4)
Globulin: 3.7 g/dL (ref 2.8–4.5)
Total Bilirubin: 0.3 MG/DL (ref 0.2–1.1)
Total Protein: 6.5 g/dL (ref 6.3–8.2)

## 2021-12-20 LAB — EKG 12-LEAD
Atrial Rate: 91 {beats}/min
P Axis: 73 degrees
P-R Interval: 124 ms
Q-T Interval: 341 ms
QRS Duration: 89 ms
QTc Calculation (Bazett): 420 ms
R Axis: 88 degrees
T Axis: 15 degrees
Ventricular Rate: 91 {beats}/min

## 2021-12-20 LAB — TROPONIN T: Troponin T: 10.4 ng/L (ref 0–22)

## 2021-12-20 LAB — D-DIMER, QUANTITATIVE: D-Dimer, Quant: 0.72 ug/ml(FEU) — ABNORMAL HIGH (ref <0.56)

## 2021-12-20 LAB — PROCALCITONIN
Procalcitonin: 0.07 ng/mL (ref 0.00–0.49)
Procalcitonin: 0.09 ng/mL (ref 0.00–0.49)

## 2021-12-20 MED ORDER — MAGNESIUM SULFATE 2000 MG/50 ML IVPB PREMIX
2 GM/50ML | INTRAVENOUS | Status: DC | PRN
Start: 2021-12-20 — End: 2021-12-25

## 2021-12-20 MED ORDER — ONDANSETRON 4 MG PO TBDP
4 MG | Freq: Three times a day (TID) | ORAL | Status: AC | PRN
Start: 2021-12-20 — End: 2021-12-25
  Administered 2021-12-20 – 2021-12-23 (×2): 4 mg via ORAL

## 2021-12-20 MED ORDER — MELATONIN 3 MG PO TABS
3 MG | Freq: Every evening | ORAL | Status: AC
Start: 2021-12-20 — End: 2021-12-25
  Administered 2021-12-25: 02:00:00 3 mg via ORAL

## 2021-12-20 MED ORDER — AMLODIPINE BESYLATE 5 MG PO TABS
5 MG | Freq: Every evening | ORAL | Status: DC
Start: 2021-12-20 — End: 2021-12-25
  Administered 2021-12-23 – 2021-12-25 (×3): 5 mg via ORAL

## 2021-12-20 MED ORDER — METHOCARBAMOL 500 MG PO TABS
500 MG | Freq: Four times a day (QID) | ORAL | Status: DC
Start: 2021-12-20 — End: 2021-12-25
  Administered 2021-12-20 – 2021-12-25 (×7): 750 mg via ORAL

## 2021-12-20 MED ORDER — FAMOTIDINE 20 MG PO TABS
20 MG | Freq: Two times a day (BID) | ORAL | Status: DC
Start: 2021-12-20 — End: 2021-12-19

## 2021-12-20 MED ORDER — HYDROCODONE BIT-HOMATROP MBR 5-1.5 MG/5ML PO SOLN
ORAL | Status: DC | PRN
Start: 2021-12-20 — End: 2021-12-25
  Administered 2021-12-20 – 2021-12-25 (×19): 5 mL via ORAL

## 2021-12-20 MED ORDER — NORMAL SALINE FLUSH 0.9 % IV SOLN
0.9 % | INTRAVENOUS | Status: DC | PRN
Start: 2021-12-20 — End: 2021-12-25

## 2021-12-20 MED ORDER — ACETAMINOPHEN 325 MG PO TABS
325 | Freq: Four times a day (QID) | ORAL | Status: DC | PRN
Start: 2021-12-20 — End: 2021-12-25
  Administered 2021-12-22 – 2021-12-23 (×3): 650 mg via ORAL

## 2021-12-20 MED ORDER — POLYETHYLENE GLYCOL 3350 17 G PO PACK
17 g | Freq: Every day | ORAL | Status: DC | PRN
Start: 2021-12-20 — End: 2021-12-25
  Administered 2021-12-24: 13:00:00 17 g via ORAL

## 2021-12-20 MED ORDER — LACTATED RINGERS IV SOLN
INTRAVENOUS | Status: AC
Start: 2021-12-20 — End: 2021-12-21
  Administered 2021-12-20: 05:00:00 via INTRAVENOUS

## 2021-12-20 MED ORDER — ONDANSETRON HCL 4 MG/2ML IJ SOLN
42 MG/2ML | Freq: Four times a day (QID) | INTRAMUSCULAR | Status: AC | PRN
Start: 2021-12-20 — End: 2021-12-25

## 2021-12-20 MED ORDER — POTASSIUM CHLORIDE 10 MEQ/100ML IV SOLN
10100 MEQ/0ML | INTRAVENOUS | Status: AC | PRN
Start: 2021-12-20 — End: 2021-12-25

## 2021-12-20 MED ORDER — STERILE WATER FOR INJECTION (MIXTURES ONLY)
1 g | INTRAMUSCULAR | Status: DC
Start: 2021-12-20 — End: 2021-12-23
  Administered 2021-12-21 – 2021-12-23 (×3): 1000 mg via INTRAVENOUS

## 2021-12-20 MED ORDER — DEXAMETHASONE SODIUM PHOSPHATE 4 MG/ML IJ SOLN
4 MG/ML | INTRAMUSCULAR | Status: DC
Start: 2021-12-20 — End: 2021-12-23
  Administered 2021-12-20 – 2021-12-22 (×3): 6 mg via INTRAVENOUS

## 2021-12-20 MED ORDER — SODIUM CHLORIDE 0.9 % IV SOLN
0.9 % | INTRAVENOUS | Status: AC | PRN
Start: 2021-12-20 — End: 2021-12-25
  Administered 2021-12-25: 03:00:00 via INTRAVENOUS

## 2021-12-20 MED ORDER — POTASSIUM BICARB-CITRIC ACID 20 MEQ PO TBEF
20 MEQ | ORAL | Status: DC | PRN
Start: 2021-12-20 — End: 2021-12-25

## 2021-12-20 MED ORDER — PANTOPRAZOLE SODIUM 40 MG PO TBEC
40 MG | Freq: Every day | ORAL | Status: AC
Start: 2021-12-20 — End: 2021-12-25
  Administered 2021-12-20 – 2021-12-25 (×5): 40 mg via ORAL

## 2021-12-20 MED ORDER — NORMAL SALINE FLUSH 0.9 % IV SOLN
0.9 % | Freq: Two times a day (BID) | INTRAVENOUS | Status: AC
Start: 2021-12-20 — End: 2021-12-25
  Administered 2021-12-20 – 2021-12-25 (×12): 10 mL via INTRAVENOUS

## 2021-12-20 MED ORDER — GUAIFENESIN 100 MG/5ML PO LIQD
100 MG/5ML | ORAL | Status: DC | PRN
Start: 2021-12-20 — End: 2021-12-25
  Administered 2021-12-20 (×3): 200 mg via ORAL

## 2021-12-20 MED ORDER — IOPAMIDOL 76 % IV SOLN
76 % | Freq: Once | INTRAVENOUS | Status: DC | PRN
Start: 2021-12-20 — End: 2021-12-19

## 2021-12-20 MED ORDER — ACETAMINOPHEN 650 MG RE SUPP
650 | Freq: Four times a day (QID) | RECTAL | Status: DC | PRN
Start: 2021-12-20 — End: 2021-12-25

## 2021-12-20 MED ORDER — AMLODIPINE BESYLATE 5 MG PO TABS
5 MG | Freq: Every day | ORAL | Status: DC
Start: 2021-12-20 — End: 2021-12-20

## 2021-12-20 MED ORDER — POTASSIUM CHLORIDE CRYS ER 20 MEQ PO TBCR
20 MEQ | ORAL | Status: DC | PRN
Start: 2021-12-20 — End: 2021-12-25

## 2021-12-20 MED ORDER — ENOXAPARIN SODIUM 40 MG/0.4ML IJ SOSY
400.4 MG/0.4ML | Freq: Every day | INTRAMUSCULAR | Status: AC
Start: 2021-12-20 — End: 2021-12-25
  Administered 2021-12-20 – 2021-12-25 (×6): 40 mg via SUBCUTANEOUS

## 2021-12-20 MED ORDER — SODIUM CHLORIDE 0.9 % IV SOLN
0.9 % | INTRAVENOUS | Status: DC
Start: 2021-12-20 — End: 2021-12-23
  Administered 2021-12-20 – 2021-12-23 (×4): 500 mg via INTRAVENOUS

## 2021-12-20 MED FILL — AMLODIPINE BESYLATE 5 MG PO TABS: 5 MG | ORAL | Qty: 1

## 2021-12-20 MED FILL — GUAIFENESIN 100 MG/5ML PO LIQD: 100 MG/5ML | ORAL | Qty: 10

## 2021-12-20 MED FILL — HYCODAN 5-1.5 MG/5ML PO SOLN: ORAL | Qty: 5

## 2021-12-20 MED FILL — AZITHROMYCIN 500 MG IV SOLR: 500 MG | INTRAVENOUS | Qty: 500

## 2021-12-20 MED FILL — PANTOPRAZOLE SODIUM 40 MG PO TBEC: 40 MG | ORAL | Qty: 1

## 2021-12-20 MED FILL — METHOCARBAMOL 500 MG PO TABS: 500 MG | ORAL | Qty: 2

## 2021-12-20 MED FILL — ONDANSETRON 4 MG PO TBDP: 4 MG | ORAL | Qty: 1

## 2021-12-20 MED FILL — DEXAMETHASONE SODIUM PHOSPHATE 4 MG/ML IJ SOLN: 4 MG/ML | INTRAMUSCULAR | Qty: 2

## 2021-12-20 MED FILL — CEFTRIAXONE SODIUM 1 G IJ SOLR: 1 g | INTRAMUSCULAR | Qty: 1000

## 2021-12-20 MED FILL — LOVENOX 40 MG/0.4ML IJ SOSY: 40 MG/0.4ML | INTRAMUSCULAR | Qty: 0.4

## 2021-12-20 NOTE — Progress Notes (Signed)
Hospitalist Progress Note   Admit Date:  12/19/2021 10:43 PM   Name:  Jeffrey Wright   Age:  61 y.o.  Sex:  male  DOB:  31-Dec-1960   MRN:  [id number]   Room:  823/01    Presenting/Chief Complaint: No chief complaint on file.     Reason(s) for Admission: Acute pneumonia [J18.9]     Hospital Course:   Jeffrey Wright is a 61 y.o. male with medical history of HTN, HLD, GERD who presented with  continued cough, fever and malaise. Dx with COVID 12/8. Continues returning to ED since that time with co symptoms related to COVID-12/8, 12/9, 12/13, 12/15.  Prior to that he was seen in ED 11/10 and 11/12 for other co.  He had a PCP but states was dismissed related to not showing up to appt.      He moved from [facility] and lives in [place] with significant other, states was seeing pain management but dismissed for unknown reason, stated takes Hydrocodone 10 mg but has not had in "7-8 months". Prescribed Robaxin by PCP.  Chart reviewed, per PCP note 11/8"  "61 year male who presents today for delayed followup.  He was last seen on 07/07/21 at which time I ordered labs and a lumbar MRI and and abdominal US.  He was noncompliant with labs and MRI and ultrasound.  He was to have returned to see me in 08/2021 but was noncompliant with follow up.  He failed to keep many office visits between 08/2021 and now.  Today he presents with numerous requests. "     In ED today D dimer noted 0.72, sats mid to high 90s on RA, no CP. Does endorse cough with nausea. Afebrile, no leukocytosis, renal function normal limits.       Subjective & 24hr Events:   C/o persistent cough- discussed about elevated d-dimer  On room air, in no resp distress at rest, not tachycardic  Duplex lower ext pending    Assessment & Plan:      Covid- diagnosed 12/8  Persistent cough   Added decadron 6 mg q daily  Not hypoxic    Pneumonia  Rocephin, Zithromax       Active Problems:  Chronic back pain  Plan: was seeing pain management but dismissed for unknown  reason, stated takes Hydrocodone 10 mg but has not had in "7-8 months". Prescribed Robaxin by PCP.  12/16- didnt c/o  back pain             GERD (gastroesophageal reflux disease)  Plan: PPI     Elevated Alk phos  Plan  [place] ordered outpt that patient was non compliant in getting  Monitor and address outpt, no abd pain       Hypertension, essential  Plan: Norvasc resumed       Hyperlipidemia, mixed  Plan: diet controlled       Medically noncompliant  Plan: education  He states needing PCP but I do not see where Dr. Luana Shu dismissed him from his practice, instead, patient has been non compliant with SEVERAL things regarding his care  *in fact, 12/14 Dr. Luana Shu was renewing Norvasc 12/14 and instructed him to go to ED if feeling poorly from COVID             Elevated d-dimer  Plan: states allergy to contrast dye, "my throat felt like it was closing" when given early 2000s  VQ scan  Doppler bilat legs  12/16- elevated d-dimer prob  secondary to covid rather then blood clot- pt not hypoxic, not tachycardic, no resp distress at rest. Venous doppler pending       BPH (benign prostatic hyperplasia)  Plan: does not take meds           PT/OT evals and PPD needed/ordered?  Yes  Diet: ADULT DIET; Regular  VTE prophylaxis: Lovenox  Code status: Full Code  Code status: Full Code      Non-peripheral Lines and Tubes (if present):          Telemetry (if present):           Hospital Problems:  Principal Problem:    Pneumonia  Active Problems:    GERD (gastroesophageal reflux disease)    Hypertension, essential    Hyperlipidemia, mixed    Medically noncompliant    COVID    Elevated d-dimer    BPH (benign prostatic hyperplasia)    Acute pneumonia  Resolved Problems:    * No resolved hospital problems. *      Objective:   Patient Vitals for the past 24 hrs:   Temp Pulse Resp BP SpO2   12/20/21 1158 99.3 F (37.4 C) 89 18 122/82 95 %   12/20/21 0749 98.8 F (37.1 C) 84 18 120/75 92 %   12/20/21 0441 98.2 F (36.8 C) 87 19 125/74 95 %    12/19/21 2308 99 F (37.2 C) 91 19 125/76 94 %       Oxygen Therapy  SpO2: 95 %    Estimated body mass index is 27.67 kg/m as calculated from the following:    Height as of this encounter: 1.753 m (_0 ).    Weight as of this encounter: 85 kg (187 lb 6.3 oz).    Intake/Output Summary (Last 24 hours) at 12/20/2021 1324  Last data filed at 12/20/2021 0542  Gross per 24 hour   Intake 705.78 ml   Output --   Net 705.78 ml         Physical Exam:     General:    Well nourished.    Head:  Normocephalic, atraumatic  Eyes:  Sclerae appear normal.  Pupils equally round.  ENT:  Nares appear normal.  Moist oral mucosa  Neck:  No restricted ROM.  Trachea midline   CV:   RRR.  No m/r/g.  No jugular venous distension.  Lungs:   Coarse breath sounds  Abdomen:   Soft, nontender, nondistended.  Extremities: No cyanosis or clubbing.  No edema  Skin:     No rashes and normal coloration.   Warm and dry.    Neuro:  CN II-XII grossly intact.    Psych:  Normal mood and affect.      I have personally reviewed labs and tests:  Recent Labs:  Recent Results (from the past 48 hour(s))   CBC with Auto Differential    Collection Time: 12/19/21  6:28 PM   Result Value Ref Range    WBC 4.9 4.3 - 11.1 K/uL    RBC 4.60 4.23 - 5.60 M/uL    Hemoglobin 14.8 13.6 - 17.2 g/dL    Hematocrit 43.6 41.1 - 50.3 %    MCV 94.8 82.0 - 102.0 FL    MCH 32.2 26.1 - 32.9 PG    MCHC 33.9 31.4 - 35.0 g/dL    RDW 13.0 11.9 - 14.6 %    Platelets 161 150 - 450 K/uL    MPV 9.1 (L) 9.4 - 12.3 FL  nRBC 0.00 0.0 - 0.2 K/uL    Differential Type AUTOMATED      Neutrophils % 68 43 - 78 %    Lymphocytes % 20 13 - 44 %    Monocytes % 12 4.0 - 12.0 %    Eosinophils % 0 (L) 0.5 - 7.8 %    Basophils % 0 0.0 - 2.0 %    Immature Granulocytes 0 0.0 - 5.0 %    Neutrophils Absolute 3.3 1.7 - 8.2 K/UL    Lymphocytes Absolute 1.0 0.5 - 4.6 K/UL    Monocytes Absolute 0.6 0.1 - 1.3 K/UL    Eosinophils Absolute 0.0 0.0 - 0.8 K/UL    Basophils Absolute 0.0 0.0 - 0.2 K/UL    Absolute  Immature Granulocyte 0.0 0.0 - 0.5 K/UL   CMP    Collection Time: 12/19/21  6:28 PM   Result Value Ref Range    Sodium 137 133 - 143 mmol/L    Potassium 4.1 3.5 - 5.1 mmol/L    Chloride 100 98 - 107 mmol/L    CO2 22 21 - 32 mmol/L    Anion Gap 15 (H) 2 - 11 mmol/L    Glucose 92 65 - 100 mg/dL    BUN 17 8 - 23 MG/DL    Creatinine 0.92 0.8 - 1.5 MG/DL    Est, Glom Filt Rate >60 >60 ml/min/1.24m    Calcium 9.1 8.3 - 10.4 MG/DL    Total Bilirubin 0.3 0.2 - 1.1 MG/DL    ALT 24 13.0 - 61.0 U/L    AST 54 (H) 15 - 37 U/L    Alk Phosphatase 208 (H) 45.0 - 117.0 U/L    Total Protein 7.1 6.4 - 8.2 g/dL    Albumin 3.4 3.2 - 4.6 g/dL    Globulin 3.7 2.8 - 4.5 g/dL    Albumin/Globulin Ratio 0.9 0.4 - 1.6     D-Dimer, Quantitative    Collection Time: 12/19/21  6:28 PM   Result Value Ref Range    D-Dimer, Quant 0.72 (H) <0.56 ug/ml(FEU)   Troponin T    Collection Time: 12/19/21  6:28 PM   Result Value Ref Range    Troponin T 10.4 0 - 22 ng/L   Procalcitonin    Collection Time: 12/19/21  6:28 PM   Result Value Ref Range    Procalcitonin 0.07 0.00 - 0.49 ng/mL   Blood Culture 1    Collection Time: 12/19/21  7:59 PM    Specimen: Blood   Result Value Ref Range    Special Requests LEFT  HAND        Culture NO GROWTH AFTER 11 HOURS     EKG 12 Lead    Collection Time: 12/19/21  9:23 PM   Result Value Ref Range    Ventricular Rate 91 BPM    Atrial Rate 91 BPM    P-R Interval 124 ms    QRS Duration 89 ms    Q-T Interval 341 ms    QTc Calculation (Bazett) 420 ms    P Axis 73 degrees    R Axis 88 degrees    T Axis 15 degrees    Diagnosis       Sinus rhythm  Borderline right axis deviation    Confirmed by DMinna AntisMD, AMitzi Hansen([id number] on 12/20/2021 6:00:22 AM     CBC with Auto Differential    Collection Time: 12/20/21  4:05 AM   Result Value Ref Range    WBC  3.5 (L) 4.3 - 11.1 K/uL    RBC 4.36 4.23 - 5.6 M/uL    Hemoglobin 13.8 13.6 - 17.2 g/dL    Hematocrit 42.2 41.1 - 50.3 %    MCV 96.8 82 - 102 FL    MCH 31.7 26.1 - 32.9 PG    MCHC 32.7  31.4 - 35.0 g/dL    RDW 12.9 11.9 - 14.6 %    Platelets 206 150 - 450 K/uL    MPV 10.0 9.4 - 12.3 FL    nRBC 0.00 0.0 - 0.2 K/uL    Differential Type AUTOMATED      Neutrophils % 64 43 - 78 %    Lymphocytes % 21 13 - 44 %    Monocytes % 14 (H) 4.0 - 12.0 %    Eosinophils % 0 (L) 0.5 - 7.8 %    Basophils % 0 0.0 - 2.0 %    Immature Granulocytes 1 0.0 - 5.0 %    Neutrophils Absolute 2.2 1.7 - 8.2 K/UL    Lymphocytes Absolute 0.7 0.5 - 4.6 K/UL    Monocytes Absolute 0.5 0.1 - 1.3 K/UL    Eosinophils Absolute 0.0 0.0 - 0.8 K/UL    Basophils Absolute 0.0 0.0 - 0.2 K/UL    Absolute Immature Granulocyte 0.0 0.0 - 0.5 K/UL   Hepatic Function Panel    Collection Time: 12/20/21  4:05 AM   Result Value Ref Range    Total Protein 6.5 6.3 - 8.2 g/dL    Albumin 2.8 (L) 3.2 - 4.6 g/dL    Globulin 3.7 2.8 - 4.5 g/dL    Albumin/Globulin Ratio 0.8 0.4 - 1.6      Total Bilirubin 0.3 0.2 - 1.1 MG/DL    Bilirubin, Direct <0.1 <0.4 MG/DL    Alk Phosphatase 190 (H) 50 - 136 U/L    AST 47 (H) 15 - 37 U/L    ALT 30 12 - 65 U/L   Basic Metabolic Panel w/ Reflex to MG    Collection Time: 12/20/21  4:05 AM   Result Value Ref Range    Sodium 140 136 - 146 mmol/L    Potassium 4.4 3.5 - 5.1 mmol/L    Chloride 107 103 - 113 mmol/L    CO2 26 21 - 32 mmol/L    Anion Gap 7 2 - 11 mmol/L    Glucose 106 (H) 65 - 100 mg/dL    BUN 15 8 - 23 MG/DL    Creatinine 0.80 0.8 - 1.5 MG/DL    Est, Glom Filt Rate >60 >60 ml/min/1.53m    Calcium 8.4 8.3 - 10.4 MG/DL   Procalcitonin    Collection Time: 12/20/21  4:05 AM   Result Value Ref Range    Procalcitonin 0.09 0.00 - 0.49 ng/mL       Current Meds:  Current Facility-Administered Medications   Medication Dose Route Frequency    guaiFENesin (ROBITUSSIN) 100 MG/5ML liquid 200 mg  200 mg Oral Q4H PRN    [START ON 12/21/2021] amLODIPine (NORVASC) tablet 5 mg  5 mg Oral Nightly    HYDROcodone homatropine (HYCODAN) 5-1.5 MG/5ML solution 5 mL  5 mL Oral Q4H PRN    cefTRIAXone (ROCEPHIN) 1,000 mg in sterile water 10 mL  IV syringe  1,000 mg IntraVENous Q24H    azithromycin (ZITHROMAX) 500 mg in sodium chloride 0.9 % 250 mL IVPB (Vial2Bag)  500 mg IntraVENous Q24H    sodium chloride flush 0.9 % injection 5-40 mL  5-40 mL IntraVENous 2 times per day    sodium chloride flush 0.9 % injection 5-40 mL  5-40 mL IntraVENous PRN    0.9 % sodium chloride infusion   IntraVENous PRN    potassium chloride (KLOR-CON M) extended release tablet 40 mEq  40 mEq Oral PRN    Or    potassium bicarb-citric acid (EFFER-K) effervescent tablet 40 mEq  40 mEq Oral PRN    Or    potassium chloride 10 mEq/100 mL IVPB (Peripheral Line)  10 mEq IntraVENous PRN    magnesium sulfate 2000 mg in 50 mL IVPB premix  2,000 mg IntraVENous PRN    enoxaparin (LOVENOX) injection 40 mg  40 mg SubCUTAneous Daily    ondansetron (ZOFRAN-ODT) disintegrating tablet 4 mg  4 mg Oral Q8H PRN    Or    ondansetron (ZOFRAN) injection 4 mg  4 mg IntraVENous Q6H PRN    polyethylene glycol (GLYCOLAX) packet 17 g  17 g Oral Daily PRN    acetaminophen (TYLENOL) tablet 650 mg  650 mg Oral Q6H PRN    Or    acetaminophen (TYLENOL) suppository 650 mg  650 mg Rectal Q6H PRN    melatonin tablet 3 mg  3 mg Oral Nightly    methocarbamol (ROBAXIN) tablet 750 mg  750 mg Oral 4x Daily    pantoprazole (PROTONIX) tablet 40 mg  40 mg Oral QAM AC       Signed:  Burna Forts, MD

## 2021-12-20 NOTE — Plan of Care (Signed)
Problem: Discharge Planning  Goal: Discharge to home or other facility with appropriate resources  Outcome: Not Progressing

## 2021-12-20 NOTE — Progress Notes (Signed)
Patient resting in bed, alert and oriented, cooperative with care. Patient denies pain or distress, safety measures in place, call light within reach.

## 2021-12-20 NOTE — Progress Notes (Signed)
Hycodan 5 ml given for patient request.

## 2021-12-20 NOTE — Progress Notes (Signed)
If you need to see a chaplain  -  just ask the nurse to call us.    We look forward to serving your family!!        Chaplain Randy

## 2021-12-21 LAB — BASIC METABOLIC PANEL W/ REFLEX TO MG FOR LOW K
Anion Gap: 6 mmol/L (ref 2–11)
BUN: 13 MG/DL (ref 8–23)
CO2: 27 mmol/L (ref 21–32)
Calcium: 8.5 MG/DL (ref 8.3–10.4)
Chloride: 106 mmol/L (ref 103–113)
Creatinine: 0.7 MG/DL — ABNORMAL LOW (ref 0.8–1.5)
Est, Glom Filt Rate: >60 mL/min/{1.73_m2} (ref >60)
Glucose: 120 mg/dL — ABNORMAL HIGH (ref 65–100)
Potassium: 4.3 mmol/L (ref 3.5–5.1)
Sodium: 139 mmol/L (ref 136–146)

## 2021-12-21 LAB — CBC WITH AUTO DIFFERENTIAL
Absolute Immature Granulocyte: 0 10*3/uL (ref 0.0–0.5)
Basophils %: 0 % (ref 0.0–2.0)
Basophils Absolute: 0 10*3/uL (ref 0.0–0.2)
Eosinophils %: 0 % — ABNORMAL LOW (ref 0.5–7.8)
Eosinophils Absolute: 0 10*3/uL (ref 0.0–0.8)
Hematocrit: 41.7 % (ref 41.1–50.3)
Hemoglobin: 13.9 g/dL (ref 13.6–17.2)
Immature Granulocytes: 1 % (ref 0.0–5.0)
Lymphocytes %: 21 % (ref 13–44)
Lymphocytes Absolute: 0.8 10*3/uL (ref 0.5–4.6)
MCH: 32.3 PG (ref 26.1–32.9)
MCHC: 33.3 g/dL (ref 31.4–35.0)
MCV: 96.8 FL (ref 82–102)
MPV: 9.4 FL (ref 9.4–12.3)
Monocytes %: 17 % — ABNORMAL HIGH (ref 4.0–12.0)
Monocytes Absolute: 0.7 10*3/uL (ref 0.1–1.3)
Neutrophils %: 61 % (ref 43–78)
Neutrophils Absolute: 2.4 10*3/uL (ref 1.7–8.2)
Platelets: 208 10*3/uL (ref 150–450)
RBC: 4.31 M/uL (ref 4.23–5.6)
RDW: 12.9 % (ref 11.9–14.6)
WBC: 4 10*3/uL — ABNORMAL LOW (ref 4.3–11.1)
nRBC: 0 10*3/uL (ref 0.0–0.2)

## 2021-12-21 LAB — D-DIMER, QUANTITATIVE: D-Dimer, Quant: 0.52 ug/ml(FEU) (ref <0.56)

## 2021-12-21 LAB — C-REACTIVE PROTEIN: CRP: 4.3 mg/dL — ABNORMAL HIGH (ref 0.0–0.9)

## 2021-12-21 MED ORDER — ALPRAZOLAM 0.25 MG PO TABS
0.25 MG | Freq: Two times a day (BID) | ORAL | Status: DC | PRN
Start: 2021-12-21 — End: 2021-12-21

## 2021-12-21 MED ORDER — BENZONATATE 100 MG PO CAPS
100 MG | Freq: Three times a day (TID) | ORAL | Status: DC | PRN
Start: 2021-12-21 — End: 2021-12-25
  Administered 2021-12-23 – 2021-12-25 (×5): 100 mg via ORAL

## 2021-12-21 MED ORDER — CETIRIZINE HCL 10 MG PO TABS
10 MG | Freq: Every day | ORAL | Status: DC
Start: 2021-12-21 — End: 2021-12-25
  Administered 2021-12-22 – 2021-12-25 (×4): 10 mg via ORAL

## 2021-12-21 MED ORDER — BLISTEX MEDICATED EX OINT
CUTANEOUS | Status: DC | PRN
Start: 2021-12-21 — End: 2021-12-25
  Administered 2021-12-21: 10:00:00 via TOPICAL

## 2021-12-21 MED ORDER — TRAMADOL HCL 50 MG PO TABS
50 MG | Freq: Four times a day (QID) | ORAL | Status: DC | PRN
Start: 2021-12-21 — End: 2021-12-25

## 2021-12-21 MED ORDER — OXYCODONE-ACETAMINOPHEN 5-325 MG PO TABS
5-325 MG | Freq: Three times a day (TID) | ORAL | Status: AC | PRN
Start: 2021-12-21 — End: 2021-12-25

## 2021-12-21 MED FILL — MELATONIN 3 MG PO TABS: 3 MG | ORAL | Qty: 1

## 2021-12-21 MED FILL — METHOCARBAMOL 500 MG PO TABS: 500 MG | ORAL | Qty: 2

## 2021-12-21 MED FILL — HYCODAN 5-1.5 MG/5ML PO SOLN: ORAL | Qty: 5

## 2021-12-21 MED FILL — DEXAMETHASONE SODIUM PHOSPHATE 4 MG/ML IJ SOLN: 4 MG/ML | INTRAMUSCULAR | Qty: 2

## 2021-12-21 MED FILL — LIP-GUARD EX OINT: CUTANEOUS | Qty: 6

## 2021-12-21 MED FILL — ALPRAZOLAM 0.25 MG PO TABS: 0.25 MG | ORAL | Qty: 1

## 2021-12-21 MED FILL — OXYCODONE-ACETAMINOPHEN 5-325 MG PO TABS: 5-325 MG | ORAL | Qty: 1

## 2021-12-21 MED FILL — CEFTRIAXONE SODIUM 1 G IJ SOLR: 1 g | INTRAMUSCULAR | Qty: 1000

## 2021-12-21 MED FILL — LOVENOX 40 MG/0.4ML IJ SOSY: 40 MG/0.4ML | INTRAMUSCULAR | Qty: 0.4

## 2021-12-21 MED FILL — PANTOPRAZOLE SODIUM 40 MG PO TBEC: 40 MG | ORAL | Qty: 1

## 2021-12-21 MED FILL — AZITHROMYCIN 500 MG IV SOLR: 500 MG | INTRAVENOUS | Qty: 500

## 2021-12-21 MED FILL — GUAIFENESIN 100 MG/5ML PO LIQD: 100 MG/5ML | ORAL | Qty: 10

## 2021-12-21 NOTE — Plan of Care (Signed)
Problem: Discharge Planning  Goal: Discharge to home or other facility with appropriate resources  Outcome: Progressing

## 2021-12-21 NOTE — Progress Notes (Signed)
Hospitalist Progress Note   Admit Date:  12/19/2021 10:43 PM   Name:  Jeffrey Wright   Age:  61 y.o.  Sex:  male  DOB:  August 29, 1960   MRN:  [id number]   Room:  823/01    Presenting/Chief Complaint: No chief complaint on file.     Reason(s) for Admission: Acute pneumonia [J18.9]     Hospital Course:   Jeffrey Wright is a 61 y.o. male with medical history of HTN, HLD, GERD who presented with  continued cough, fever and malaise. Dx with COVID 12/8. Continues returning to ED since that time with co symptoms related to COVID-12/8, 12/9, 12/13, 12/15.  Prior to that he was seen in ED 11/10 and 11/12 for other co.  He had a PCP but states was dismissed related to not showing up to appt.      He moved from [facility] and lives in [place] with significant other, states was seeing pain management but dismissed for unknown reason, stated takes Hydrocodone 10 mg but has not had in "7-8 months". Prescribed Robaxin by PCP.  Chart reviewed, per PCP note 11/8"  "61 year male who presents today for delayed followup.  He was last seen on 07/07/21 at which time I ordered labs and a lumbar MRI and and abdominal US.  He was noncompliant with labs and MRI and ultrasound.  He was to have returned to see me in 08/2021 but was noncompliant with follow up.  He failed to keep many office visits between 08/2021 and now.  Today he presents with numerous requests. "     In ED today D dimer noted 0.72, sats mid to high 90s on RA, no CP. Does endorse cough with nausea. Afebrile, no leukocytosis, renal function normal limits.       Subjective & 24hr Events:   12/16  C/o persistent cough- discussed about elevated d-dimer  On room air, in no resp distress at rest, not tachycardic  Duplex lower ext pending    12/17  Having persistent cough, on Hycodan, D-dimer within the normal range, venous Doppler bilateral lower extremity negative for DVT  Added as needed meds    Assessment & Plan:      Covid- diagnosed 12/8  Persistent cough   Added  decadron 6 mg q daily  Not hypoxic  12/17-continue Hycodan as needed, as needed albuterol nebs, add Tessalon Perles    Pneumonia  Rocephin, Zithromax       Active Problems:  Chronic back pain  Plan: was seeing pain management but dismissed for unknown reason, stated takes Hydrocodone 10 mg but has not had in "7-8 months". Prescribed Robaxin by PCP.  12/16- didnt c/o  back pain             GERD (gastroesophageal reflux disease)  Plan: PPI     Elevated Alk phos  Plan  [place] ordered outpt that patient was non compliant in getting  Monitor and address outpt, no abd pain       Hypertension, essential  Plan: Norvasc resumed       Hyperlipidemia, mixed  Plan: diet controlled       Medically noncompliant  Plan: education  He states needing PCP but I do not see where Dr. Luana Shu dismissed him from his practice, instead, patient has been non compliant with SEVERAL things regarding his care  *in fact, 12/14 Dr. Luana Shu was renewing Norvasc 12/14 and instructed him to go to ED if feeling poorly from COVID  Elevated d-dimer  Plan: states allergy to contrast dye, "my throat felt like it was closing" when given early 2000s  VQ scan  Doppler bilat legs  12/16- elevated d-dimer prob secondary to covid rather then blood clot- pt not hypoxic, not tachycardic, no resp distress at rest. Venous doppler pending  12/17-repeat D-dimer within normal range, venous Doppler bilateral lower extremity negative for DVT       BPH (benign prostatic hyperplasia)  Plan: does not take meds           PT/OT evals and PPD needed/ordered?  Yes  Diet: ADULT DIET; Regular  VTE prophylaxis: Lovenox  Code status: Full Code  Code status: Full Code      Non-peripheral Lines and Tubes (if present):          Telemetry (if present):  Cardiac/Telemetry Monitor On: Portable telemetry pack applied        Hospital Problems:  Principal Problem:    Pneumonia  Active Problems:    GERD (gastroesophageal reflux disease)    Hypertension, essential    Hyperlipidemia,  mixed    Medically noncompliant    COVID    Elevated d-dimer    BPH (benign prostatic hyperplasia)    Acute pneumonia  Resolved Problems:    * No resolved hospital problems. *      Objective:   Patient Vitals for the past 24 hrs:   Temp Pulse Resp BP SpO2   12/21/21 1224 97.9 F (36.6 C) 76 18 126/84 95 %   12/21/21 0754 97.7 F (36.5 C) 67 18 108/66 94 %   12/21/21 0332 98.6 F (37 C) 83 19 117/71 93 %   12/21/21 0010 -- 86 -- 108/70 --   12/21/21 0001 98.8 F (37.1 C) 87 19 99/68 92 %   12/20/21 1943 98.6 F (37 C) 96 19 115/69 94 %   12/20/21 1624 99.5 F (37.5 C) 99 18 (!) 144/73 91 %       Oxygen Therapy  SpO2: 95 %  O2 Device: None (Room air)    Estimated body mass index is 27.67 kg/m as calculated from the following:    Height as of this encounter: 1.753 m (_0 ).    Weight as of this encounter: 85 kg (187 lb 6.3 oz).    Intake/Output Summary (Last 24 hours) at 12/21/2021 1530  Last data filed at 12/21/2021 0745  Gross per 24 hour   Intake 948 ml   Output --   Net 948 ml         Physical Exam:     General:    Well nourished.  Persistent cough  Head:  Normocephalic, atraumatic  Eyes:  Sclerae appear normal.  Pupils equally round.  ENT:  Nares appear normal.  Moist oral mucosa  Neck:  No restricted ROM.  Trachea midline   CV:   RRR.  No m/r/g.  No jugular venous distension.  Lungs:   Coarse breath sounds  Abdomen:   Soft, nontender, nondistended.  Extremities: No cyanosis or clubbing.  No edema  Skin:     No rashes and normal coloration.   Warm and dry.    Neuro:  CN II-XII grossly intact.    Psych:  Normal mood and affect.      I have personally reviewed labs and tests:  Recent Labs:  Recent Results (from the past 48 hour(s))   CBC with Auto Differential    Collection Time: 12/19/21  6:28 PM   Result Value  Ref Range    WBC 4.9 4.3 - 11.1 K/uL    RBC 4.60 4.23 - 5.60 M/uL    Hemoglobin 14.8 13.6 - 17.2 g/dL    Hematocrit 43.6 41.1 - 50.3 %    MCV 94.8 82.0 - 102.0 FL    MCH 32.2 26.1 - 32.9 PG    MCHC  33.9 31.4 - 35.0 g/dL    RDW 13.0 11.9 - 14.6 %    Platelets 161 150 - 450 K/uL    MPV 9.1 (L) 9.4 - 12.3 FL    nRBC 0.00 0.0 - 0.2 K/uL    Differential Type AUTOMATED      Neutrophils % 68 43 - 78 %    Lymphocytes % 20 13 - 44 %    Monocytes % 12 4.0 - 12.0 %    Eosinophils % 0 (L) 0.5 - 7.8 %    Basophils % 0 0.0 - 2.0 %    Immature Granulocytes 0 0.0 - 5.0 %    Neutrophils Absolute 3.3 1.7 - 8.2 K/UL    Lymphocytes Absolute 1.0 0.5 - 4.6 K/UL    Monocytes Absolute 0.6 0.1 - 1.3 K/UL    Eosinophils Absolute 0.0 0.0 - 0.8 K/UL    Basophils Absolute 0.0 0.0 - 0.2 K/UL    Absolute Immature Granulocyte 0.0 0.0 - 0.5 K/UL   CMP    Collection Time: 12/19/21  6:28 PM   Result Value Ref Range    Sodium 137 133 - 143 mmol/L    Potassium 4.1 3.5 - 5.1 mmol/L    Chloride 100 98 - 107 mmol/L    CO2 22 21 - 32 mmol/L    Anion Gap 15 (H) 2 - 11 mmol/L    Glucose 92 65 - 100 mg/dL    BUN 17 8 - 23 MG/DL    Creatinine 0.92 0.8 - 1.5 MG/DL    Est, Glom Filt Rate >60 >60 ml/min/1.64m    Calcium 9.1 8.3 - 10.4 MG/DL    Total Bilirubin 0.3 0.2 - 1.1 MG/DL    ALT 24 13.0 - 61.0 U/L    AST 54 (H) 15 - 37 U/L    Alk Phosphatase 208 (H) 45.0 - 117.0 U/L    Total Protein 7.1 6.4 - 8.2 g/dL    Albumin 3.4 3.2 - 4.6 g/dL    Globulin 3.7 2.8 - 4.5 g/dL    Albumin/Globulin Ratio 0.9 0.4 - 1.6     D-Dimer, Quantitative    Collection Time: 12/19/21  6:28 PM   Result Value Ref Range    D-Dimer, Quant 0.72 (H) <0.56 ug/ml(FEU)   Troponin T    Collection Time: 12/19/21  6:28 PM   Result Value Ref Range    Troponin T 10.4 0 - 22 ng/L   Procalcitonin    Collection Time: 12/19/21  6:28 PM   Result Value Ref Range    Procalcitonin 0.07 0.00 - 0.49 ng/mL   Blood Culture 1    Collection Time: 12/19/21  7:59 PM    Specimen: Blood   Result Value Ref Range    Special Requests LEFT  HAND        Culture NO GROWTH 2 DAYS     EKG 12 Lead    Collection Time: 12/19/21  9:23 PM   Result Value Ref Range    Ventricular Rate 91 BPM    Atrial Rate 91 BPM    P-R  Interval 124 ms    QRS  Duration 89 ms    Q-T Interval 341 ms    QTc Calculation (Bazett) 420 ms    P Axis 73 degrees    R Axis 88 degrees    T Axis 15 degrees    Diagnosis       Sinus rhythm  Borderline right axis deviation    Confirmed by Minna Antis MD, Mitzi Hansen [phone]) on 12/20/2021 6:00:22 AM     CBC with Auto Differential    Collection Time: 12/20/21  4:05 AM   Result Value Ref Range    WBC 3.5 (L) 4.3 - 11.1 K/uL    RBC 4.36 4.23 - 5.6 M/uL    Hemoglobin 13.8 13.6 - 17.2 g/dL    Hematocrit 42.2 41.1 - 50.3 %    MCV 96.8 82 - 102 FL    MCH 31.7 26.1 - 32.9 PG    MCHC 32.7 31.4 - 35.0 g/dL    RDW 12.9 11.9 - 14.6 %    Platelets 206 150 - 450 K/uL    MPV 10.0 9.4 - 12.3 FL    nRBC 0.00 0.0 - 0.2 K/uL    Differential Type AUTOMATED      Neutrophils % 64 43 - 78 %    Lymphocytes % 21 13 - 44 %    Monocytes % 14 (H) 4.0 - 12.0 %    Eosinophils % 0 (L) 0.5 - 7.8 %    Basophils % 0 0.0 - 2.0 %    Immature Granulocytes 1 0.0 - 5.0 %    Neutrophils Absolute 2.2 1.7 - 8.2 K/UL    Lymphocytes Absolute 0.7 0.5 - 4.6 K/UL    Monocytes Absolute 0.5 0.1 - 1.3 K/UL    Eosinophils Absolute 0.0 0.0 - 0.8 K/UL    Basophils Absolute 0.0 0.0 - 0.2 K/UL    Absolute Immature Granulocyte 0.0 0.0 - 0.5 K/UL   Hepatic Function Panel    Collection Time: 12/20/21  4:05 AM   Result Value Ref Range    Total Protein 6.5 6.3 - 8.2 g/dL    Albumin 2.8 (L) 3.2 - 4.6 g/dL    Globulin 3.7 2.8 - 4.5 g/dL    Albumin/Globulin Ratio 0.8 0.4 - 1.6      Total Bilirubin 0.3 0.2 - 1.1 MG/DL    Bilirubin, Direct <0.1 <0.4 MG/DL    Alk Phosphatase 190 (H) 50 - 136 U/L    AST 47 (H) 15 - 37 U/L    ALT 30 12 - 65 U/L   Basic Metabolic Panel w/ Reflex to MG    Collection Time: 12/20/21  4:05 AM   Result Value Ref Range    Sodium 140 136 - 146 mmol/L    Potassium 4.4 3.5 - 5.1 mmol/L    Chloride 107 103 - 113 mmol/L    CO2 26 21 - 32 mmol/L    Anion Gap 7 2 - 11 mmol/L    Glucose 106 (H) 65 - 100 mg/dL    BUN 15 8 - 23 MG/DL    Creatinine 0.80 0.8 - 1.5 MG/DL     Est, Glom Filt Rate >60 >60 ml/min/1.72m    Calcium 8.4 8.3 - 10.4 MG/DL   Procalcitonin    Collection Time: 12/20/21  4:05 AM   Result Value Ref Range    Procalcitonin 0.09 0.00 - 0.49 ng/mL   CBC with Auto Differential    Collection Time: 12/21/21  4:24 AM   Result Value Ref Range  WBC 4.0 (L) 4.3 - 11.1 K/uL    RBC 4.31 4.23 - 5.6 M/uL    Hemoglobin 13.9 13.6 - 17.2 g/dL    Hematocrit 41.7 41.1 - 50.3 %    MCV 96.8 82 - 102 FL    MCH 32.3 26.1 - 32.9 PG    MCHC 33.3 31.4 - 35.0 g/dL    RDW 12.9 11.9 - 14.6 %    Platelets 208 150 - 450 K/uL    MPV 9.4 9.4 - 12.3 FL    nRBC 0.00 0.0 - 0.2 K/uL    Differential Type AUTOMATED      Neutrophils % 61 43 - 78 %    Lymphocytes % 21 13 - 44 %    Monocytes % 17 (H) 4.0 - 12.0 %    Eosinophils % 0 (L) 0.5 - 7.8 %    Basophils % 0 0.0 - 2.0 %    Immature Granulocytes 1 0.0 - 5.0 %    Neutrophils Absolute 2.4 1.7 - 8.2 K/UL    Lymphocytes Absolute 0.8 0.5 - 4.6 K/UL    Monocytes Absolute 0.7 0.1 - 1.3 K/UL    Eosinophils Absolute 0.0 0.0 - 0.8 K/UL    Basophils Absolute 0.0 0.0 - 0.2 K/UL    Absolute Immature Granulocyte 0.0 0.0 - 0.5 K/UL   Basic Metabolic Panel w/ Reflex to MG    Collection Time: 12/21/21  4:24 AM   Result Value Ref Range    Sodium 139 136 - 146 mmol/L    Potassium 4.3 3.5 - 5.1 mmol/L    Chloride 106 103 - 113 mmol/L    CO2 27 21 - 32 mmol/L    Anion Gap 6 2 - 11 mmol/L    Glucose 120 (H) 65 - 100 mg/dL    BUN 13 8 - 23 MG/DL    Creatinine 0.70 (L) 0.8 - 1.5 MG/DL    Est, Glom Filt Rate >60 >60 ml/min/1.74m    Calcium 8.5 8.3 - 10.4 MG/DL   D-Dimer, Quantitative    Collection Time: 12/21/21  4:24 AM   Result Value Ref Range    D-Dimer, Quant 0.52 <0.56 ug/ml(FEU)   C-Reactive Protein    Collection Time: 12/21/21  4:24 AM   Result Value Ref Range    CRP 4.3 (H) 0.0 - 0.9 mg/dL       Current Meds:  Current Facility-Administered Medications   Medication Dose Route Frequency    medicated lip ointment (BLISTEX)   Topical PRN    benzonatate (TESSALON) capsule  100 mg  100 mg Oral TID PRN    guaiFENesin (ROBITUSSIN) 100 MG/5ML liquid 200 mg  200 mg Oral Q4H PRN    amLODIPine (NORVASC) tablet 5 mg  5 mg Oral Nightly    HYDROcodone homatropine (HYCODAN) 5-1.5 MG/5ML solution 5 mL  5 mL Oral Q4H PRN    dexamethasone (DECADRON) injection 6 mg  6 mg IntraVENous Q24H    cefTRIAXone (ROCEPHIN) 1,000 mg in sterile water 10 mL IV syringe  1,000 mg IntraVENous Q24H    azithromycin (ZITHROMAX) 500 mg in sodium chloride 0.9 % 250 mL IVPB (Vial2Bag)  500 mg IntraVENous Q24H    sodium chloride flush 0.9 % injection 5-40 mL  5-40 mL IntraVENous 2 times per day    sodium chloride flush 0.9 % injection 5-40 mL  5-40 mL IntraVENous PRN    0.9 % sodium chloride infusion   IntraVENous PRN    potassium chloride (KLOR-CON M) extended  release tablet 40 mEq  40 mEq Oral PRN    Or    potassium bicarb-citric acid (EFFER-K) effervescent tablet 40 mEq  40 mEq Oral PRN    Or    potassium chloride 10 mEq/100 mL IVPB (Peripheral Line)  10 mEq IntraVENous PRN    magnesium sulfate 2000 mg in 50 mL IVPB premix  2,000 mg IntraVENous PRN    enoxaparin (LOVENOX) injection 40 mg  40 mg SubCUTAneous Daily    ondansetron (ZOFRAN-ODT) disintegrating tablet 4 mg  4 mg Oral Q8H PRN    Or    ondansetron (ZOFRAN) injection 4 mg  4 mg IntraVENous Q6H PRN    polyethylene glycol (GLYCOLAX) packet 17 g  17 g Oral Daily PRN    acetaminophen (TYLENOL) tablet 650 mg  650 mg Oral Q6H PRN    Or    acetaminophen (TYLENOL) suppository 650 mg  650 mg Rectal Q6H PRN    melatonin tablet 3 mg  3 mg Oral Nightly    methocarbamol (ROBAXIN) tablet 750 mg  750 mg Oral 4x Daily    pantoprazole (PROTONIX) tablet 40 mg  40 mg Oral QAM AC       Signed:  Burna Forts, MD

## 2021-12-21 NOTE — Progress Notes (Signed)
Patient refused Robaxin, stating he "takes it at night, not at 0900". Patient also refused Lovenox inj.   Encouraged patient to wake up and eat breakfast.   Will continue to monitor.

## 2021-12-21 NOTE — Progress Notes (Signed)
ACUTE OCCUPATIONAL THERAPY GOALS:   (Developed with and agreed upon by patient and/or caregiver.)  ALL GOALS MET 12/21/2021  1. Patient will complete full body dressing with independence and adaptive equipment as needed.   2. Patient will complete toileting with independence.   3. Patient will complete functional transfers with independence and adaptive equipment as needed.   4. Patient will tolerate at least 15 minutes of OT activity with less than 2 rest breaks while maintaining O2 sats >90%.   5. Patient will verbalize at least 3 energy conservation technique to utilize during ADL/IADL.   6. Patient will complete grooming in standing at sink level with independence.  7. Patient will complete household distances with independence and adaptive equipment as needed.    Timeframe: 1 visit  OCCUPATIONAL THERAPY Initial Assessment, Daily Note, Discharge, and AM          Acknowledge Orders  Time  OT Charge Capture  Rehab Caseload Tracker      C19+    Jeffrey Wright is a 61 y.o. male   PRIMARY DIAGNOSIS: Pneumonia  Acute pneumonia [J18.9]       Reason for Referral: Generalized Muscle Weakness (M62.81)  Other lack of cordination (R27.8)  Inpatient: [place] [employer] [place] [place] / [place] / Product Type: *No Product type* /     ASSESSMENT:     REHAB RECOMMENDATIONS:   Recommendation to date pending progress:  Setting:  No further skilled occupational therapy after discharge from hospital    Equipment:    None     ASSESSMENT:  Jeffrey Wright is a 61 YO right hand dominant WM admitted with cough, fever, and general malaise and diagnosed with above. C19+ diagnosis on 12/12/2021. H/o noncompliance. At baseline, pt lives with his significant other in a 1 level home with steps and HRs at entrance. Pt is normally INDEPENDENT with ADLs, INDEPENDENT with IADLs, and completes ambulation with no DME, still driving, and pt reports no falls. Today, pt independent with in room mobility, has been getting himself up to the  bathroom all night, is dressed in his clothes and reports he does not need OT. Some coughing and possible decreased activity tolerance noted, but pt reports he can manage on his own. Pt is functioning near his baseline and does not need skilled OT services at this time. DC OT. Pt in agreement.     [facility]	[place] AM-PACT "6 Clicks" Daily Activity Inpatient Short Form:    AM-PAC Daily Activity - Inpatient   How much help is needed for putting on and taking off regular lower body clothing?: None  How much help is needed for bathing (which includes washing, rinsing, drying)?: None  How much help is needed for toileting (which includes using toilet, bedpan, or urinal)?: None  How much help is needed for putting on and taking off regular upper body clothing?: None  How much help is needed for taking care of personal grooming?: None  How much help for eating meals?: None  AM-PAC Inpatient Daily Activity Raw Score: 24  AM-PAC Inpatient ADL T-Scale Score : 57.54  ADL Inpatient CMS 0-100% Score: 0  ADL Inpatient CMS G-Code Modifier : CH    SUBJECTIVE:     Jeffrey Wright states, "I don't need help. I need sleep. They have been waking me up all night."     Social/Functional Lives With: Significant other  Type of Home: House  Home Layout: One level  ADL Assistance: [place]: Independent  Ambulation Assistance: Independent  Transfer Assistance: Independent  Active Driver: Yes    OBJECTIVE:     LINES / DRAINS / AIRWAY: None    RESTRICTIONS/PRECAUTIONS:       PAIN: VITALS / O2:   Pre Treatment: no complaint of pain         Post Treatment: no complaint of pain       Vitals          Oxygen  room air,s ats >90%            GROSS EVALUATION: INTACT IMPAIRED   (See Comments)   UE AROM _0  _1    UE PROM _2  _3    Strength _4   B UEs     Posture / Balance _5      Sensation _6   B UEs   Coordination _7   B UEs     Tone _8        Edema _9     Activity Tolerance _10   Slightly decreased from baseline     Hand Dominance R _11  L  _12       COGNITION/  PERCEPTION: INTACT IMPAIRED   (See Comments)   Orientation _13   x4   Vision _14      Hearing _15      Cognition  _16      Perception _17        MOBILITY: I Mod I S SBA CGA Min Mod Max Total  NT x2 Comments:   Bed Mobility    Rolling _18  _19  _20  _21  _22  _23  _24  _25  _26  _27  _28     Supine to Sit _29  _30  _31  _32  _33  _34  _35  _36  _37  _38  _39     Scooting _40  _41  _42  _43  _44  _45  _46  _47  _48  _49  _50     Sit to Supine _51  _52  _53  _54  _55  _56  _57  _58  _59  _60  _61     Transfers    Sit to Stand _62  _63  _64  _65  _66  _67  _68  _69  _70  _71  _72     Bed to Chair _73  _74  _75  _76  _77  _78  _79  _80  _81  _82  _83     Stand to Sit _84  _85  _86  _87  _88  _89  _90  _91  _92  _93  _94     Tub/Shower _95  _96  _97  _98  _99  _100  _101  _102  _103  _104  _105      Toilet _106  _107  _108  _109  _110  _111  _112  _113  _114  _115  _116       _117  _118  _119  _120  _121  _122  _123  _124  _125  _126  _127     I=Independent, Mod I=Modified Independent, S=Supervision/Setup, SBA=Standby Assistance, CGA=Contact Guard Assistance, Min=Minimal Assistance, Mod=Moderate Assistance, Max=Maximal Assistance, Total=Total Assistance, NT=Not Tested    ACTIVITIES OF DAILY LIVING: I Mod I S SBA CGA Min Mod Max Total NT Comments   BASIC ADLs:              Upper Body Bathing  _128  _129  _130  _131  _132  _133  _134  _135  _136  _137      Lower Body Bathing _138  _139  _140  _141  _142  _143  _144  _145  _146  _147      Toileting _148  _149  _150  _151  _152  _153  _154  _155  _156  _157     Upper Body Dressing _158  _159  _160  _161  _162  _163  _164  _165  _166  _167     Lower Body Dressing _168  _169  _170  _171  _172  _173  _174  _175  _176  _177     Feeding _178  _179  _180  _181  _182  _183  _184  _185  _186  _187     Grooming _188  _189  _190  _191  _192  _193  _194  _195  _196  _197     Personal Device Care _198  _199  _200  _201  _202  _203  _204  _205  _206  _207     Functional  Mobility _0  _1  _2  _3  _4  _5  _6  _7  _8  _9     I=Independent, Mod I=Modified Independent, S=Supervision/Setup, SBA=Standby Assistance, CGA=Contact Guard Assistance, Min=Minimal Assistance, Mod=Moderate Assistance, Max=Maximal Assistance, Total=Total Assistance, NT=Not Tested    PLAN:   FREQUENCY/DURATION     for duration of hospital stay or until stated goals are met, whichever comes first.    PROBLEM LIST:   (Skilled intervention is medically necessary to  address:)  Decreased ADL/Functional Activities  Decreased Activity Tolerance  Decreased Balance   INTERVENTIONS PLANNED:  (Benefits and precautions of occupational therapy have been discussed with the patient.)  Self Care Training  Therapeutic Activity  Education  Above completed         TREATMENT:     EVALUATION: LOW COMPLEXITY: (Untimed Charge)    TREATMENT:   Self Care (12 minutes): Patient participated in toileting, upper body dressing, lower body dressing, self feeding, and grooming ADLs in unsupported sitting and standing with no verbal and tactile cueing to increase activity tolerance and increase safety awareness. Patient also participated in functional mobility, functional transfer, energy conservation, and adaptive equipment training to increase activity tolerance and increase safety awareness.     TREATMENT GRID:  N/A    AFTER TREATMENT PRECAUTIONS: Bed, Bed/Chair Locked, Call light within reach, Needs within reach, RN notified, and Side rails x2    INTERDISCIPLINARY COLLABORATION:  RN/ PCT and OT/ COTA    EDUCATION:  Education Given To: Patient  Education Provided: Role of Therapy;Plan of Care;Precautions;ADL Adaptive Strategies;Transfer Training;Energy Conservation;Orientation;Equipment;Fall Prevention Strategies  Education Provided Comments: importance of being OOB  Education Method: Demonstration;Verbal;Teach Back  Barriers to Learning: None  Education Outcome: Verbalized understanding;Demonstrated understanding    TOTAL TREATMENT DURATION AND TIME:  Time In: 0728  Time Out: 0740  Minutes: 12    Cleora Karnik A Uniqua Kihn, OT   Brittani Purdum A Reiko Vinje, MS, OTR/L

## 2021-12-22 ENCOUNTER — Inpatient Hospital Stay: Admit: 2021-12-22 | Payer: MEDICAID

## 2021-12-22 MED FILL — PANTOPRAZOLE SODIUM 40 MG PO TBEC: 40 MG | ORAL | Qty: 1

## 2021-12-22 MED FILL — AMLODIPINE BESYLATE 5 MG PO TABS: 5 MG | ORAL | Qty: 1

## 2021-12-22 MED FILL — METHOCARBAMOL 500 MG PO TABS: 500 MG | ORAL | Qty: 2

## 2021-12-22 MED FILL — AZITHROMYCIN 500 MG IV SOLR: 500 MG | INTRAVENOUS | Qty: 500

## 2021-12-22 MED FILL — DEXAMETHASONE SODIUM PHOSPHATE 4 MG/ML IJ SOLN: 4 MG/ML | INTRAMUSCULAR | Qty: 2

## 2021-12-22 MED FILL — LOVENOX 40 MG/0.4ML IJ SOSY: 40 MG/0.4ML | INTRAMUSCULAR | Qty: 0.4

## 2021-12-22 MED FILL — HYCODAN 5-1.5 MG/5ML PO SOLN: ORAL | Qty: 5

## 2021-12-22 MED FILL — MELATONIN 3 MG PO TABS: 3 MG | ORAL | Qty: 1

## 2021-12-22 MED FILL — CEFTRIAXONE SODIUM 1 G IJ SOLR: 1 g | INTRAMUSCULAR | Qty: 1000

## 2021-12-22 MED FILL — ACETAMINOPHEN 325 MG PO TABS: 325 MG | ORAL | Qty: 2

## 2021-12-22 MED FILL — CETIRIZINE HCL 10 MG PO TABS: 10 MG | ORAL | Qty: 1

## 2021-12-22 NOTE — Progress Notes (Signed)
ACUTE PHYSICAL THERAPY GOALS:   (Developed with and agreed upon by patient and/or caregiver.)    Pt will perform bed mobility with ind in 7 therapy sessions.  Pt will perform sit-to-stand/ stand-to-sit transfers ind in 7 therapy sessions.  Pt will ambulate 25 ft ind with use of LRAD/no device and breaks as needed in 7 therapy sessions.  Pt will complete a toilet transfer with ind in 7 therapy sessions.    All goals met on 12/22/21    PHYSICAL THERAPY Initial Evaluation and Discharge  (Link to Caseload Tracking:    Acknowledge Orders  Time In/Out  PT Charge Capture  Rehab Caseload Tracker    Jeffrey Wright is a 61 y.o. male   PRIMARY DIAGNOSIS: Pneumonia  Acute pneumonia [J18.9]       Reason for Referral: Other abnormalities of gait and mobility (R26.89)  Inpatient: [place] / [place] / Product Type: *No Product type* /     ASSESSMENT:     REHAB RECOMMENDATIONS:   Recommendation to date pending progress:  Setting:  No further skilled physical therapy after discharge from hospital    Equipment:    None     ASSESSMENT:  [name] is a 61 y.o. male presenting to PT following a hospitalization due to COVID-19/pneumonia. At time of initial evaluation, pt presents at approximate baseline LOF with no major deficits limiting overall functional mobility that warrant safety concerns. Today, pt performed all mobility and transfers with ind including ambulation of 25' in room with no AD as well as a STS trials from both EOB and toilet. During ambulation, pt presents with no major gait disturbances and ultimately did well to avoid any LOB/ miss-steps. Pt performed all activity on RA and denied any dizziness, lightheadedness or SOB during ambulation. At this time, pt is not an appropriate candidate for skilled PT and will be discharged from caseload; pt informed and agreeable with DC plans and denies any PT needs at DC. At end of session, pt was left supine in bed per pt request with needs  in reach.     DC Recommendation: No needs.     [facility]	[place] AM-PACT "6 Clicks" Basic Mobility Inpatient Short Form  AM-PAC Basic Mobility - Inpatient   How much help is needed turning from your back to your side while in a flat bed without using bedrails?: None  How much help is needed moving from lying on your back to sitting on the side of a flat bed without using bedrails?: None  How much help is needed moving to and from a bed to a chair?: None  How much help is needed standing up from a chair using your arms?: None  How much help is needed walking in hospital room?: None  How much help is needed climbing 3-5 steps with a railing?: None  AM-PAC Inpatient Mobility Raw Score : 24  AM-PAC Inpatient T-Scale Score : 61.14  Mobility Inpatient CMS 0-100% Score: 0  Mobility Inpatient CMS G-Code Modifier : CH    SUBJECTIVE:   [name] states, "Goodbye"     Social/Functional Lives With: Significant other  Type of Home: House  Home Layout: One level  ADL Assistance: Independent  Homemaking Assistance: Independent  Ambulation Assistance: Independent  Transfer Assistance: Independent  Active Driver: Yes    OBJECTIVE:     PAIN: VITALS / O2: PRECAUTION / LINES / DRAINS:   Pre Treatment: 0  Post Treatment: 0 Vitals        Oxygen RA      IV    RESTRICTIONS/PRECAUTIONS:                    GROSS EVALUATION: Intact Impaired (Comments):   AROM _0      PROM _1  NT   Strength _2   WFL   Balance _3      Posture _4  N/A   Sensation _5      Coordination _6       Tone _7  NT   Edema _8     Activity Tolerance _9   At previous baseline    _10       COGNITION/  PERCEPTION: Intact Impaired (Comments):   Orientation _11      Vision _12   Appears functionally intact    Hearing _13   Appears functionally intact    Cognition  _14        MOBILITY: I Mod I S SBA CGA Min Mod Max Total  NT x2 Comments:   Bed Mobility    Rolling _15  _16  _17  _18  _19  _20  _21  _22  _23  _24  _25     Supine to Sit _26  _27  _28  _29  _30  _31  _32  _33  _34  _35  _36     Scooting _37  _38  _39  _40  _41  _42  _43  _44   _45  _46  _47     Sit to Supine _48  _49  _50  _51  _52  _53  _54  _55  _56  _57  _58     Transfers    Sit to Stand _59  _60  _61  _62  _63  _64  _65  _66  _67  _68  _69     Bed to Chair _70  _71  _72  _73  _74  _75  _76  _77  _78  _79  _80     Stand to Sit _81  _82  _83  _84  _85  _86  _87  _88  _89  _90  _91      _92  _93  _94  _95  _96  _97  _98  _99  _100  _101  _102     I=Independent, Mod I=Modified Independent, S=Supervision, SBA=Standby Assistance, CGA=Contact Guard Assistance,   Min=Minimal Assistance, Mod=Moderate Assistance, Max=Maximal Assistance, Total=Total Assistance, NT=Not Tested    GAIT: I Mod I S SBA CGA Min Mod Max Total  NT x2 Comments:   Level of Assistance _103  _104  _105  _106  _107  _108  _109  _110  _111  _112  _113     Distance 25  feet    DME None    Gait Quality N/A    Weightbearing Status      Stairs      I=Independent, Mod I=Modified Independent, S=Supervision, SBA=Standby Assistance, CGA=Contact Guard Assistance,   Min=Minimal Assistance, Mod=Moderate Assistance, Max=Maximal Assistance, Total=Total Assistance, NT=Not Tested    PLAN:   FREQUENCY AND DURATION: N/A    THERAPY PROGNOSIS: N/A    PROBLEM LIST:   (Skilled intervention is medically necessary to address:)  DC from PT caseload INTERVENTIONS PLANNED:   (Benefits and precautions of physical therapy have been discussed with the patient.)  N/A       TREATMENT:   EVALUATION: LOW COMPLEXITY: (Untimed Charge)    TREATMENT:   Therapeutic Activity (10 Minutes): Therapeutic activity included Supine to Sit, Sit to Supine, Transfer Training, Ambulation on level ground, Sitting balance , and Standing balance to improve functional Activity tolerance, Balance, Coordination, Mobility, Strength, and ROM.    TREATMENT GRID:  N/A    AFTER TREATMENT PRECAUTIONS: Bed, Bed/Chair Locked, Call light within reach, Needs within reach, and RN notified    INTERDISCIPLINARY COLLABORATION:  RN/ PCT, PT/ PTA, and OT/ COTA    EDUCATION:    Education Given To: Patient  Education Provided: Role of Therapy    TIME IN/OUT:  Time In: 0830  Time Out: 0842  Minutes: [address], PT

## 2021-12-22 NOTE — Care Coordination-Inpatient (Signed)
12/22/21 0935   Service Assessment   Patient Orientation Alert and Oriented   Cognition Alert   History Provided By Patient   Primary Caregiver Self   Accompanied By/Relationship no one at [place] is:   (significant other listed)   PCP Verified by CM No   Prior Functional Level Independent in ADLs/IADLs   Current Functional Level Independent in ADLs/IADLs   Can patient return to prior living arrangement Unknown at present   Ability to make needs known: Good   Family able to assist with home care needs: Yes   Would you like for me to discuss the discharge plan with any other family members/significant others, and if so, who? Yes   Financial Resources [employer] None   CM/SW Referral No PCP   Social/Functional History   Lives With Significant other   Home Equipment None   Receives Help From Friend(s)   ADL Assistance Independent   Homemaking Assistance Independent   Homemaking Responsibilities No   Medical laboratory scientific officer Yes   Discharge Planning   Type of [place] Spouse/Significant Other   Current Services Prior To Admission None   Potential Assistance Needed N/A   DME Ordered? No   Potential Assistance Purchasing Medications No   Type of Home Care Services None   Patient expects to be discharged to: House   History of falls? 0     Patient has a pcp but didn't give CM the name of the MD but has an appointment scheduled. CM is not anticipating any discharge needs but remains available.

## 2021-12-22 NOTE — Progress Notes (Addendum)
Hospitalist Progress Note   Admit Date:  12/19/2021 10:43 PM   Name:  Jeffrey Wright   Age:  61 y.o.  Sex:  male  DOB:  03/19/1960   MRN:  [id number]   Room:  823/01    Presenting/Chief Complaint: No chief complaint on file.     Reason(s) for Admission: Acute pneumonia [J18.9]     Hospital Course:   Jeffrey Wright is a 61 y.o. male with medical history of HTN, HLD, GERD who presented with  continued cough, fever and malaise. Dx with COVID 12/8. Continues returning to ED since that time with co symptoms related to COVID-12/8, 12/9, 12/13, 12/15.  Prior to that he was seen in ED 11/10 and 11/12 for other co.  He had a PCP but states was dismissed related to not showing up to appt.      He moved from [facility] and lives in [place] with significant other, states was seeing pain management but dismissed for unknown reason, stated takes Hydrocodone 10 mg but has not had in "7-8 months". Prescribed Robaxin by PCP.  Chart reviewed, per PCP note 11/8"  "61 year male who presents today for delayed followup.  He was last seen on 07/07/21 at which time I ordered labs and a lumbar MRI and and abdominal US.  He was noncompliant with labs and MRI and ultrasound.  He was to have returned to see me in 08/2021 but was noncompliant with follow up.  He failed to keep many office visits between 08/2021 and now.  Today he presents with numerous requests. "     In ED today D dimer noted 0.72, sats mid to high 90s on RA, no CP. Does endorse cough with nausea. Afebrile, no leukocytosis, renal function normal limits.       Subjective & 24hr Events:   12/16  C/o persistent cough- discussed about elevated d-dimer  On room air, in no resp distress at rest, not tachycardic  Duplex lower ext pending    12/17  Having persistent cough, on Hycodan, D-dimer within the normal range, venous Doppler bilateral lower extremity negative for DVT  Added as needed meds    12/18  Still having persistent  dry cough  Will   order ct chest non  contrast  Fever of 101.8 this evening    Assessment & Plan:      Covid- diagnosed 12/8  Persistent cough   Added decadron 6 mg q daily  Not hypoxic  12/17-continue Hycodan as needed, as needed albuterol nebs, add Tessalon Perles  12/18- persistent cough- ordered  ct chest    Pneumonia  Rocephin, Zithromax       Active Problems:  Chronic back pain  Plan: was seeing pain management but dismissed for unknown reason, stated takes Hydrocodone 10 mg but has not had in "7-8 months". Prescribed Robaxin by PCP.  12/16- didnt c/o  back pain             GERD (gastroesophageal reflux disease)  Plan: PPI     Elevated Alk phos  Plan  [place] ordered outpt that patient was non compliant in getting  Monitor and address outpt, no abd pain       Hypertension, essential  Plan: Norvasc resumed       Hyperlipidemia, mixed  Plan: diet controlled       Medically noncompliant  Plan: education  He states needing PCP but I do not see where Dr. Luana Shu dismissed him from his practice, instead, patient  has been non compliant with SEVERAL things regarding his care  *in fact, 12/14 Dr. Luana Shu was renewing Norvasc 12/14 and instructed him to go to ED if feeling poorly from COVID             Elevated d-dimer  Plan: states allergy to contrast dye, "my throat felt like it was closing" when given early 2000s  VQ scan  Doppler bilat legs  12/16- elevated d-dimer prob secondary to covid rather then blood clot- pt not hypoxic, not tachycardic, no resp distress at rest. Venous doppler pending  12/17-repeat D-dimer within normal range, venous Doppler bilateral lower extremity negative for DVT       BPH (benign prostatic hyperplasia)  Plan: does not take meds           PT/OT evals and PPD needed/ordered?  Yes  Diet: ADULT DIET; Regular  VTE prophylaxis: Lovenox  Code status: Full Code  Code status: Full Code      Non-peripheral Lines and Tubes (if present):          Telemetry (if present):  Cardiac/Telemetry Monitor On: Portable telemetry pack applied         Hospital Problems:  Principal Problem:    Pneumonia  Active Problems:    GERD (gastroesophageal reflux disease)    Hypertension, essential    Hyperlipidemia, mixed    Medically noncompliant    COVID    Elevated d-dimer    BPH (benign prostatic hyperplasia)    Acute pneumonia  Resolved Problems:    * No resolved hospital problems. *      Objective:   Patient Vitals for the past 24 hrs:   Temp Pulse Resp BP SpO2   12/22/21 1536 (!) 101.8 F (38.8 C) (!) 107 22 131/79 93 %   12/22/21 1137 99.3 F (37.4 C) 74 20 129/87 94 %   12/22/21 0749 98.2 F (36.8 C) 73 20 119/79 93 %   12/22/21 0340 98.2 F (36.8 C) 79 20 122/77 92 %   12/22/21 0017 98.1 F (36.7 C) 54 18 117/80 92 %   12/21/21 2016 98.6 F (37 C) 85 20 107/67 90 %       Oxygen Therapy  SpO2: 93 %  O2 Device: None (Room air)    Estimated body mass index is 27.67 kg/m as calculated from the following:    Height as of this encounter: 1.753 m (_0 ).    Weight as of this encounter: 85 kg (187 lb 6.3 oz).    Intake/Output Summary (Last 24 hours) at 12/22/2021 1651  Last data filed at 12/22/2021 0830  Gross per 24 hour   Intake 10 ml   Output --   Net 10 ml         Physical Exam:     General:    Well nourished.  Persistent cough  Head:  Normocephalic, atraumatic  Eyes:  Sclerae appear normal.  Pupils equally round.  ENT:  Nares appear normal.  Moist oral mucosa  Neck:  No restricted ROM.  Trachea midline   CV:   RRR.  No m/r/g.  No jugular venous distension.  Lungs:   Coarse breath sounds  Abdomen:   Soft, nontender, nondistended.  Extremities: No cyanosis or clubbing.  No edema  Skin:     No rashes and normal coloration.   Warm and dry.    Neuro:  CN II-XII grossly intact.    Psych:  Normal mood and affect.      I  have personally reviewed labs and tests:  Recent Labs:  Recent Results (from the past 48 hour(s))   CBC with Auto Differential    Collection Time: 12/21/21  4:24 AM   Result Value Ref Range    WBC 4.0 (L) 4.3 - 11.1 K/uL    RBC 4.31 4.23 - 5.6 M/uL     Hemoglobin 13.9 13.6 - 17.2 g/dL    Hematocrit 41.7 41.1 - 50.3 %    MCV 96.8 82 - 102 FL    MCH 32.3 26.1 - 32.9 PG    MCHC 33.3 31.4 - 35.0 g/dL    RDW 12.9 11.9 - 14.6 %    Platelets 208 150 - 450 K/uL    MPV 9.4 9.4 - 12.3 FL    nRBC 0.00 0.0 - 0.2 K/uL    Differential Type AUTOMATED      Neutrophils % 61 43 - 78 %    Lymphocytes % 21 13 - 44 %    Monocytes % 17 (H) 4.0 - 12.0 %    Eosinophils % 0 (L) 0.5 - 7.8 %    Basophils % 0 0.0 - 2.0 %    Immature Granulocytes 1 0.0 - 5.0 %    Neutrophils Absolute 2.4 1.7 - 8.2 K/UL    Lymphocytes Absolute 0.8 0.5 - 4.6 K/UL    Monocytes Absolute 0.7 0.1 - 1.3 K/UL    Eosinophils Absolute 0.0 0.0 - 0.8 K/UL    Basophils Absolute 0.0 0.0 - 0.2 K/UL    Absolute Immature Granulocyte 0.0 0.0 - 0.5 K/UL   Basic Metabolic Panel w/ Reflex to MG    Collection Time: 12/21/21  4:24 AM   Result Value Ref Range    Sodium 139 136 - 146 mmol/L    Potassium 4.3 3.5 - 5.1 mmol/L    Chloride 106 103 - 113 mmol/L    CO2 27 21 - 32 mmol/L    Anion Gap 6 2 - 11 mmol/L    Glucose 120 (H) 65 - 100 mg/dL    BUN 13 8 - 23 MG/DL    Creatinine 0.70 (L) 0.8 - 1.5 MG/DL    Est, Glom Filt Rate >60 >60 ml/min/1.48m    Calcium 8.5 8.3 - 10.4 MG/DL   D-Dimer, Quantitative    Collection Time: 12/21/21  4:24 AM   Result Value Ref Range    D-Dimer, Quant 0.52 <0.56 ug/ml(FEU)   C-Reactive Protein    Collection Time: 12/21/21  4:24 AM   Result Value Ref Range    CRP 4.3 (H) 0.0 - 0.9 mg/dL       Current Meds:  Current Facility-Administered Medications   Medication Dose Route Frequency    medicated lip ointment (BLISTEX)   Topical PRN    benzonatate (TESSALON) capsule 100 mg  100 mg Oral TID PRN    cetirizine (ZYRTEC) tablet 10 mg  10 mg Oral Daily    traMADol (ULTRAM) tablet 50 mg  50 mg Oral Q6H PRN    oxyCODONE-acetaminophen (PERCOCET) 5-325 MG per tablet 1 tablet  1 tablet Oral Q8H PRN    guaiFENesin (ROBITUSSIN) 100 MG/5ML liquid 200 mg  200 mg Oral Q4H PRN    amLODIPine (NORVASC) tablet 5 mg  5 mg  Oral Nightly    HYDROcodone homatropine (HYCODAN) 5-1.5 MG/5ML solution 5 mL  5 mL Oral Q4H PRN    dexamethasone (DECADRON) injection 6 mg  6 mg IntraVENous Q24H    cefTRIAXone (ROCEPHIN) 1,000 mg in sterile  water 10 mL IV syringe  1,000 mg IntraVENous Q24H    azithromycin (ZITHROMAX) 500 mg in sodium chloride 0.9 % 250 mL IVPB (Vial2Bag)  500 mg IntraVENous Q24H    sodium chloride flush 0.9 % injection 5-40 mL  5-40 mL IntraVENous 2 times per day    sodium chloride flush 0.9 % injection 5-40 mL  5-40 mL IntraVENous PRN    0.9 % sodium chloride infusion   IntraVENous PRN    potassium chloride (KLOR-CON M) extended release tablet 40 mEq  40 mEq Oral PRN    Or    potassium bicarb-citric acid (EFFER-K) effervescent tablet 40 mEq  40 mEq Oral PRN    Or    potassium chloride 10 mEq/100 mL IVPB (Peripheral Line)  10 mEq IntraVENous PRN    magnesium sulfate 2000 mg in 50 mL IVPB premix  2,000 mg IntraVENous PRN    enoxaparin (LOVENOX) injection 40 mg  40 mg SubCUTAneous Daily    ondansetron (ZOFRAN-ODT) disintegrating tablet 4 mg  4 mg Oral Q8H PRN    Or    ondansetron (ZOFRAN) injection 4 mg  4 mg IntraVENous Q6H PRN    polyethylene glycol (GLYCOLAX) packet 17 g  17 g Oral Daily PRN    acetaminophen (TYLENOL) tablet 650 mg  650 mg Oral Q6H PRN    Or    acetaminophen (TYLENOL) suppository 650 mg  650 mg Rectal Q6H PRN    melatonin tablet 3 mg  3 mg Oral Nightly    methocarbamol (ROBAXIN) tablet 750 mg  750 mg Oral 4x Daily    pantoprazole (PROTONIX) tablet 40 mg  40 mg Oral QAM AC       Signed:  Burna Forts, MD

## 2021-12-23 LAB — CBC WITH AUTO DIFFERENTIAL
Absolute Immature Granulocyte: 0.1 10*3/uL (ref 0.0–0.5)
Basophils %: 0 % (ref 0.0–2.0)
Basophils Absolute: 0 10*3/uL (ref 0.0–0.2)
Eosinophils %: 0 % — ABNORMAL LOW (ref 0.5–7.8)
Eosinophils Absolute: 0 10*3/uL (ref 0.0–0.8)
Hematocrit: 40 % — ABNORMAL LOW (ref 41.1–50.3)
Hemoglobin: 13.3 g/dL — ABNORMAL LOW (ref 13.6–17.2)
Immature Granulocytes: 1 % (ref 0.0–5.0)
Lymphocytes %: 16 % (ref 13–44)
Lymphocytes Absolute: 1 10*3/uL (ref 0.5–4.6)
MCH: 31.6 PG (ref 26.1–32.9)
MCHC: 33.3 g/dL (ref 31.4–35.0)
MCV: 95 FL (ref 82–102)
MPV: 9.4 FL (ref 9.4–12.3)
Monocytes %: 16 % — ABNORMAL HIGH (ref 4.0–12.0)
Monocytes Absolute: 1 10*3/uL (ref 0.1–1.3)
Neutrophils %: 67 % (ref 43–78)
Neutrophils Absolute: 4.1 10*3/uL (ref 1.7–8.2)
Platelets: 237 10*3/uL (ref 150–450)
RBC: 4.21 M/uL — ABNORMAL LOW (ref 4.23–5.6)
RDW: 12.9 % (ref 11.9–14.6)
WBC: 6.1 10*3/uL (ref 4.3–11.1)
nRBC: 0 10*3/uL (ref 0.0–0.2)

## 2021-12-23 LAB — BASIC METABOLIC PANEL W/ REFLEX TO MG FOR LOW K
Anion Gap: 6 mmol/L (ref 2–11)
BUN: 11 MG/DL (ref 8–23)
CO2: 29 mmol/L (ref 21–32)
Calcium: 8.5 MG/DL (ref 8.3–10.4)
Chloride: 103 mmol/L (ref 103–113)
Creatinine: 0.7 MG/DL — ABNORMAL LOW (ref 0.8–1.5)
Est, Glom Filt Rate: >60 mL/min/{1.73_m2} (ref >60)
Glucose: 109 mg/dL — ABNORMAL HIGH (ref 65–100)
Potassium: 3.8 mmol/L (ref 3.5–5.1)
Sodium: 138 mmol/L (ref 136–146)

## 2021-12-23 LAB — MYCOPLASMA PNEUMONIAE ANTIBODIES IGG & IGM
Mycoplasma pneumo IgG: 227 U/mL — ABNORMAL HIGH (ref 0–99)
Mycoplasma pneumo IgM: <770 U/mL (ref 0–769)

## 2021-12-23 MED ORDER — STERILE WATER FOR INJECTION (MIXTURES ONLY)
2 g | Freq: Once | INTRAVENOUS | Status: AC
Start: 2021-12-23 — End: 2021-12-23
  Administered 2021-12-23: 17:00:00 2000 mg via INTRAVENOUS

## 2021-12-23 MED ORDER — CEFEPIME HCL 2 G IV SOLR
2 g | Freq: Three times a day (TID) | INTRAVENOUS | Status: AC
Start: 2021-12-23 — End: 2021-12-30
  Administered 2021-12-24 – 2021-12-25 (×5): 2000 mg via INTRAVENOUS

## 2021-12-23 MED ORDER — LIDOCAINE HCL (PF) 4 % IJ SOLN
4 % | Freq: Four times a day (QID) | INTRAMUSCULAR | Status: AC
Start: 2021-12-23 — End: 2021-12-24
  Administered 2021-12-23 – 2021-12-24 (×2): 4 mL via RESPIRATORY_TRACT

## 2021-12-23 MED ORDER — DEXAMETHASONE 4 MG PO TABS
4 MG | Freq: Every day | ORAL | Status: AC
Start: 2021-12-23 — End: 2021-12-29
  Administered 2021-12-23 – 2021-12-25 (×3): 6 mg via ORAL

## 2021-12-23 MED ORDER — AZITHROMYCIN 250 MG PO TABS
250 MG | Freq: Every day | ORAL | Status: AC
Start: 2021-12-23 — End: 2021-12-23
  Administered 2021-12-24: 02:00:00 500 mg via ORAL

## 2021-12-23 MED ORDER — SODIUM CHLORIDE 0.9 % IV SOLN (MINI-BAG)
0.9 % | Freq: Two times a day (BID) | INTRAVENOUS | Status: DC
Start: 2021-12-23 — End: 2021-12-23

## 2021-12-23 MED ORDER — DOXYCYCLINE HYCLATE 100 MG PO CAPS
100 MG | Freq: Two times a day (BID) | ORAL | Status: AC
Start: 2021-12-23 — End: 2021-12-30
  Administered 2021-12-23 – 2021-12-25 (×5): 100 mg via ORAL

## 2021-12-23 MED ORDER — LIDOCAINE HCL (PF) 1 % IJ SOLN
1 % | Freq: Four times a day (QID) | INTRAMUSCULAR | Status: DC
Start: 2021-12-23 — End: 2021-12-23

## 2021-12-23 MED FILL — METHOCARBAMOL 500 MG PO TABS: 500 MG | ORAL | Qty: 2

## 2021-12-23 MED FILL — AZITHROMYCIN 500 MG IV SOLR: 500 MG | INTRAVENOUS | Qty: 500

## 2021-12-23 MED FILL — HYCODAN 5-1.5 MG/5ML PO SOLN: ORAL | Qty: 5

## 2021-12-23 MED FILL — ACETAMINOPHEN 325 MG PO TABS: 325 MG | ORAL | Qty: 2

## 2021-12-23 MED FILL — CEFEPIME HCL 2 G IV SOLR: 2 g | INTRAVENOUS | Qty: 2

## 2021-12-23 MED FILL — ONDANSETRON 4 MG PO TBDP: 4 MG | ORAL | Qty: 1

## 2021-12-23 MED FILL — MELATONIN 3 MG PO TABS: 3 MG | ORAL | Qty: 1

## 2021-12-23 MED FILL — CEFTRIAXONE SODIUM 1 G IJ SOLR: 1 g | INTRAMUSCULAR | Qty: 1000

## 2021-12-23 MED FILL — DOXYCYCLINE HYCLATE 100 MG PO CAPS: 100 MG | ORAL | Qty: 1

## 2021-12-23 MED FILL — BENZONATATE 100 MG PO CAPS: 100 MG | ORAL | Qty: 1

## 2021-12-23 MED FILL — PANTOPRAZOLE SODIUM 40 MG PO TBEC: 40 MG | ORAL | Qty: 1

## 2021-12-23 MED FILL — LIDOCAINE HCL (PF) 4 % IJ SOLN: 4 % | INTRAMUSCULAR | Qty: 5

## 2021-12-23 MED FILL — AMLODIPINE BESYLATE 5 MG PO TABS: 5 MG | ORAL | Qty: 1

## 2021-12-23 MED FILL — LOVENOX 40 MG/0.4ML IJ SOSY: 40 MG/0.4ML | INTRAMUSCULAR | Qty: 0.4

## 2021-12-23 MED FILL — DEXAMETHASONE 4 MG PO TABS: 4 MG | ORAL | Qty: 2

## 2021-12-23 MED FILL — CETIRIZINE HCL 10 MG PO TABS: 10 MG | ORAL | Qty: 1

## 2021-12-23 NOTE — Progress Notes (Signed)
Hospitalist Progress Note   Admit Date:  12/19/2021 10:43 PM   Name:  Jeffrey Wright   Age:  61 y.o.  Sex:  male  DOB:  Sep 23, 1960   MRN:  [id number]   Room:  823/01    Presenting/Chief Complaint: No chief complaint on file.     Reason(s) for Admission: Acute pneumonia [J18.9]     Hospital Course:   Jeffrey Wright is a 61 y.o. male with medical history of HTN, HLD, GERD who presented with  continued cough, fever and malaise. Dx with COVID 12/8. Continues returning to ED since that time with co symptoms related to COVID-12/8, 12/9, 12/13, 12/15.  Prior to that he was seen in ED 11/10 and 11/12 for other co.  He had a PCP but states was dismissed related to not showing up to appt.      He moved from [facility] and lives in [place] with significant other, states was seeing pain management but dismissed for unknown reason, stated takes Hydrocodone 10 mg but has not had in "7-8 months". Prescribed Robaxin by PCP.  Chart reviewed, per PCP note 11/8"  "61 year male who presents today for delayed followup.  He was last seen on 07/07/21 at which time I ordered labs and a lumbar MRI and and abdominal US.  He was noncompliant with labs and MRI and ultrasound.  He was to have returned to see me in 08/2021 but was noncompliant with follow up.  He failed to keep many office visits between 08/2021 and now.  Today he presents with numerous requests. "     In ED today D dimer noted 0.72, sats mid to high 90s on RA, no CP. Does endorse cough with nausea. Afebrile, no leukocytosis, renal function normal limits.       Subjective & 24hr Events:   12/16  C/o persistent cough- discussed about elevated d-dimer  On room air, in no resp distress at rest, not tachycardic  Duplex lower ext pending    12/17  Having persistent cough, on Hycodan, D-dimer within the normal range, venous Doppler bilateral lower extremity negative for DVT  Added as needed meds    12/18  Still having persistent  dry cough  Will   order ct chest non  contrast  Fever of 101.8 this evening    12/19  Patient had CT chest yesterday, reviewed that with pulmonary, felt covid changes, as patient still complaining of cough recommended lidocaine nebs  Patient alert with temperature 101.1 F, discontinue Rocephin, started on cefepime  Blood cultures ordered yesterday no growth for now      Assessment & Plan:      Covid- diagnosed 12/8  Persistent cough   Added decadron 6 mg q daily  Not hypoxic  12/17-continue Hycodan as needed, as needed albuterol nebs, add Tessalon Perles  12/18- persistent cough- ordered  ct chest  12/19-Patient had CT chest yesterday, reviewed that with pulmonary, felt covid changes, as patient still complaining of cough recommended lidocaine nebs    Pneumonia  Rocephin, Zithromax  12/19-with episode of fever, discontinue Rocephin started on cefepime       Active Problems:  Chronic back pain  Plan: was seeing pain management but dismissed for unknown reason, stated takes Hydrocodone 10 mg but has not had in "7-8 months". Prescribed Robaxin by PCP.  12/16- didnt c/o  back pain             GERD (gastroesophageal reflux disease)  Plan: PPI  Elevated Alk phos  Plan  [place] ordered outpt that patient was non compliant in getting  Monitor and address outpt, no abd pain       Hypertension, essential  Plan: Norvasc resumed       Hyperlipidemia, mixed  Plan: diet controlled       Medically noncompliant  Plan: education  He states needing PCP but I do not see where Dr. Luana Shu dismissed him from his practice, instead, patient has been non compliant with SEVERAL things regarding his care  *in fact, 12/14 Dr. Luana Shu was renewing Norvasc 12/14 and instructed him to go to ED if feeling poorly from COVID             Elevated d-dimer  Plan: states allergy to contrast dye, "my throat felt like it was closing" when given early 2000s  VQ scan  Doppler bilat legs  12/16- elevated d-dimer prob secondary to covid rather then blood clot- pt not hypoxic, not tachycardic, no resp  distress at rest. Venous doppler pending  12/17-repeat D-dimer within normal range, venous Doppler bilateral lower extremity negative for DVT       BPH (benign prostatic hyperplasia)  Plan: does not take meds           PT/OT evals and PPD needed/ordered?  Yes  Diet: ADULT DIET; Regular  VTE prophylaxis: Lovenox  Code status: Full Code  Code status: Full Code      Non-peripheral Lines and Tubes (if present):          Telemetry (if present):  Cardiac/Telemetry Monitor On: Portable telemetry pack applied        Hospital Problems:  Principal Problem:    Pneumonia  Active Problems:    GERD (gastroesophageal reflux disease)    Hypertension, essential    Hyperlipidemia, mixed    Medically noncompliant    COVID    Elevated d-dimer    BPH (benign prostatic hyperplasia)    Acute pneumonia  Resolved Problems:    * No resolved hospital problems. *      Objective:   Patient Vitals for the past 24 hrs:   Temp Pulse Resp BP SpO2   12/23/21 1548 98.9 F (37.2 C) 99 19 132/88 95 %   12/23/21 1514 -- (!) 101 20 -- 94 %   12/23/21 1427 100.1 F (37.8 C) -- -- -- --   12/23/21 1230 99.4 F (37.4 C) -- -- -- --   12/23/21 1059 (!) 101.1 F (38.4 C) (!) 117 19 (!) 130/90 96 %   12/23/21 0734 99.5 F (37.5 C) 93 18 120/72 91 %   12/23/21 0700 99.3 F (37.4 C) -- -- -- --   12/23/21 0359 99 F (37.2 C) 92 18 112/73 91 %   12/22/21 2327 (!) 100.6 F (38.1 C) -- -- -- --   12/22/21 2325 (!) 100.5 F (38.1 C) 96 19 120/74 94 %   12/22/21 2030 -- -- -- 127/76 --   12/22/21 1941 99.5 F (37.5 C) 97 19 114/68 93 %       Oxygen Therapy  SpO2: 95 %  Pulse Oximeter Device Mode: Intermittent  O2 Device: None (Room air)    Estimated body mass index is 27.67 kg/m as calculated from the following:    Height as of this encounter: 1.753 m (_0 ).    Weight as of this encounter: 85 kg (187 lb 6.3 oz).    Intake/Output Summary (Last 24 hours) at 12/23/2021 1606  Last data filed  at 12/23/2021 1353  Gross per 24 hour   Intake 540 ml   Output --    Net 540 ml         Physical Exam:     General:    Well nourished.  Persistent cough  Head:  Normocephalic, atraumatic  Eyes:  Sclerae appear normal.  Pupils equally round.  ENT:  Nares appear normal.  Moist oral mucosa  Neck:  No restricted ROM.  Trachea midline   CV:   RRR.  No m/r/g.  No jugular venous distension.  Lungs:   Coarse breath sounds  Abdomen:   Soft, nontender, nondistended.  Extremities: No cyanosis or clubbing.  No edema  Skin:     No rashes and normal coloration.   Warm and dry.    Neuro:  CN II-XII grossly intact.    Psych:  Normal mood and affect.      I have personally reviewed labs and tests:  Recent Labs:  Recent Results (from the past 48 hour(s))   Culture, Blood 1    Collection Time: 12/22/21  5:18 PM    Specimen: Blood   Result Value Ref Range    Special Requests LEFT  HAND        Culture NO GROWTH AFTER 13 HOURS     Culture, Blood 1    Collection Time: 12/22/21  6:04 PM    Specimen: Blood   Result Value Ref Range    Special Requests LEFT  HAND        Culture NO GROWTH AFTER 13 HOURS     CBC with Auto Differential    Collection Time: 12/23/21  3:57 AM   Result Value Ref Range    WBC 6.1 4.3 - 11.1 K/uL    RBC 4.21 (L) 4.23 - 5.6 M/uL    Hemoglobin 13.3 (L) 13.6 - 17.2 g/dL    Hematocrit 40.0 (L) 41.1 - 50.3 %    MCV 95.0 82 - 102 FL    MCH 31.6 26.1 - 32.9 PG    MCHC 33.3 31.4 - 35.0 g/dL    RDW 12.9 11.9 - 14.6 %    Platelets 237 150 - 450 K/uL    MPV 9.4 9.4 - 12.3 FL    nRBC 0.00 0.0 - 0.2 K/uL    Differential Type AUTOMATED      Neutrophils % 67 43 - 78 %    Lymphocytes % 16 13 - 44 %    Monocytes % 16 (H) 4.0 - 12.0 %    Eosinophils % 0 (L) 0.5 - 7.8 %    Basophils % 0 0.0 - 2.0 %    Immature Granulocytes 1 0.0 - 5.0 %    Neutrophils Absolute 4.1 1.7 - 8.2 K/UL    Lymphocytes Absolute 1.0 0.5 - 4.6 K/UL    Monocytes Absolute 1.0 0.1 - 1.3 K/UL    Eosinophils Absolute 0.0 0.0 - 0.8 K/UL    Basophils Absolute 0.0 0.0 - 0.2 K/UL    Absolute Immature Granulocyte 0.1 0.0 - 0.5 K/UL   Basic  Metabolic Panel w/ Reflex to MG    Collection Time: 12/23/21  3:57 AM   Result Value Ref Range    Sodium 138 136 - 146 mmol/L    Potassium 3.8 3.5 - 5.1 mmol/L    Chloride 103 103 - 113 mmol/L    CO2 29 21 - 32 mmol/L    Anion Gap 6 2 - 11 mmol/L    Glucose 109 (  H) 65 - 100 mg/dL    BUN 11 8 - 23 MG/DL    Creatinine 0.70 (L) 0.8 - 1.5 MG/DL    Est, Glom Filt Rate >60 >60 ml/min/1.36m    Calcium 8.5 8.3 - 10.4 MG/DL       Current Meds:  Current Facility-Administered Medications   Medication Dose Route Frequency    doxycycline hyclate (VIBRAMYCIN) capsule 100 mg  100 mg Oral BID    lidocaine PF 4 % injection 4 mL  4 mL Inhalation 4x Daily RT    ceFEPIme (MAXIPIME) 2,000 mg in sodium chloride 0.9 % 100 mL IVPB (mini-bag)  2,000 mg IntraVENous Q8H    azithromycin (ZITHROMAX) tablet 500 mg  500 mg Oral Daily    dexAMETHasone (DECADRON) tablet 6 mg  6 mg Oral Daily    medicated lip ointment (BLISTEX)   Topical PRN    benzonatate (TESSALON) capsule 100 mg  100 mg Oral TID PRN    cetirizine (ZYRTEC) tablet 10 mg  10 mg Oral Daily    traMADol (ULTRAM) tablet 50 mg  50 mg Oral Q6H PRN    oxyCODONE-acetaminophen (PERCOCET) 5-325 MG per tablet 1 tablet  1 tablet Oral Q8H PRN    guaiFENesin (ROBITUSSIN) 100 MG/5ML liquid 200 mg  200 mg Oral Q4H PRN    amLODIPine (NORVASC) tablet 5 mg  5 mg Oral Nightly    HYDROcodone homatropine (HYCODAN) 5-1.5 MG/5ML solution 5 mL  5 mL Oral Q4H PRN    sodium chloride flush 0.9 % injection 5-40 mL  5-40 mL IntraVENous 2 times per day    sodium chloride flush 0.9 % injection 5-40 mL  5-40 mL IntraVENous PRN    0.9 % sodium chloride infusion   IntraVENous PRN    potassium chloride (KLOR-CON M) extended release tablet 40 mEq  40 mEq Oral PRN    Or    potassium bicarb-citric acid (EFFER-K) effervescent tablet 40 mEq  40 mEq Oral PRN    Or    potassium chloride 10 mEq/100 mL IVPB (Peripheral Line)  10 mEq IntraVENous PRN    magnesium sulfate 2000 mg in 50 mL IVPB premix  2,000 mg IntraVENous PRN     enoxaparin (LOVENOX) injection 40 mg  40 mg SubCUTAneous Daily    ondansetron (ZOFRAN-ODT) disintegrating tablet 4 mg  4 mg Oral Q8H PRN    Or    ondansetron (ZOFRAN) injection 4 mg  4 mg IntraVENous Q6H PRN    polyethylene glycol (GLYCOLAX) packet 17 g  17 g Oral Daily PRN    acetaminophen (TYLENOL) tablet 650 mg  650 mg Oral Q6H PRN    Or    acetaminophen (TYLENOL) suppository 650 mg  650 mg Rectal Q6H PRN    melatonin tablet 3 mg  3 mg Oral Nightly    methocarbamol (ROBAXIN) tablet 750 mg  750 mg Oral 4x Daily    pantoprazole (PROTONIX) tablet 40 mg  40 mg Oral QAM AC       Signed:  SBurna Forts MD

## 2021-12-24 LAB — BASIC METABOLIC PANEL W/ REFLEX TO MG FOR LOW K
Anion Gap: 6 mmol/L (ref 2–11)
BUN: 15 MG/DL (ref 8–23)
CO2: 28 mmol/L (ref 21–32)
Calcium: 8.8 MG/DL (ref 8.3–10.4)
Chloride: 103 mmol/L (ref 103–113)
Creatinine: 0.6 MG/DL — ABNORMAL LOW (ref 0.8–1.5)
Est, Glom Filt Rate: >60 mL/min/{1.73_m2} (ref >60)
Glucose: 109 mg/dL — ABNORMAL HIGH (ref 65–100)
Potassium: 4 mmol/L (ref 3.5–5.1)
Sodium: 137 mmol/L (ref 136–146)

## 2021-12-24 LAB — CBC WITH AUTO DIFFERENTIAL
Absolute Immature Granulocyte: 0.1 10*3/uL (ref 0.0–0.5)
Basophils %: 0 % (ref 0.0–2.0)
Basophils Absolute: 0 10*3/uL (ref 0.0–0.2)
Eosinophils %: 0 % — ABNORMAL LOW (ref 0.5–7.8)
Eosinophils Absolute: 0 10*3/uL (ref 0.0–0.8)
Hematocrit: 40.7 % — ABNORMAL LOW (ref 41.1–50.3)
Hemoglobin: 13.6 g/dL (ref 13.6–17.2)
Immature Granulocytes: 2 % (ref 0.0–5.0)
Lymphocytes %: 13 % (ref 13–44)
Lymphocytes Absolute: 0.8 10*3/uL (ref 0.5–4.6)
MCH: 32.2 PG (ref 26.1–32.9)
MCHC: 33.4 g/dL (ref 31.4–35.0)
MCV: 96.2 FL (ref 82–102)
MPV: 8.9 FL — ABNORMAL LOW (ref 9.4–12.3)
Monocytes %: 17 % — ABNORMAL HIGH (ref 4.0–12.0)
Monocytes Absolute: 1.1 10*3/uL (ref 0.1–1.3)
Neutrophils %: 68 % (ref 43–78)
Neutrophils Absolute: 4.1 10*3/uL (ref 1.7–8.2)
Platelets: 246 10*3/uL (ref 150–450)
RBC: 4.23 M/uL (ref 4.23–5.6)
RDW: 12.8 % (ref 11.9–14.6)
WBC: 6.1 10*3/uL (ref 4.3–11.1)
nRBC: 0 10*3/uL (ref 0.0–0.2)

## 2021-12-24 LAB — CULTURE, BLOOD 1: Culture: NO GROWTH

## 2021-12-24 LAB — C-REACTIVE PROTEIN: CRP: 15.3 mg/dL — ABNORMAL HIGH (ref 0.0–0.9)

## 2021-12-24 LAB — PROCALCITONIN: Procalcitonin: 0.14 ng/mL (ref 0.00–0.49)

## 2021-12-24 MED ORDER — BARICITINIB 2 MG PO TABS
2 MG | Freq: Every day | ORAL | Status: DC
Start: 2021-12-24 — End: 2021-12-25
  Administered 2021-12-24 – 2021-12-25 (×2): 4 mg via ORAL

## 2021-12-24 MED ORDER — LIDOCAINE HCL (PF) 4 % IJ SOLN
4 % | Freq: Two times a day (BID) | INTRAMUSCULAR | Status: DC
Start: 2021-12-24 — End: 2021-12-24

## 2021-12-24 MED FILL — METHOCARBAMOL 500 MG PO TABS: 500 MG | ORAL | Qty: 2

## 2021-12-24 MED FILL — HYCODAN 5-1.5 MG/5ML PO SOLN: ORAL | Qty: 5

## 2021-12-24 MED FILL — BENZONATATE 100 MG PO CAPS: 100 MG | ORAL | Qty: 1

## 2021-12-24 MED FILL — CEFEPIME HCL 2 G IV SOLR: 2 g | INTRAVENOUS | Qty: 2

## 2021-12-24 MED FILL — LIDOCAINE HCL (PF) 4 % IJ SOLN: 4 % | INTRAMUSCULAR | Qty: 5

## 2021-12-24 MED FILL — DOXYCYCLINE HYCLATE 100 MG PO CAPS: 100 MG | ORAL | Qty: 1

## 2021-12-24 MED FILL — LOVENOX 40 MG/0.4ML IJ SOSY: 40 MG/0.4ML | INTRAMUSCULAR | Qty: 0.4

## 2021-12-24 MED FILL — CETIRIZINE HCL 10 MG PO TABS: 10 MG | ORAL | Qty: 1

## 2021-12-24 MED FILL — PANTOPRAZOLE SODIUM 40 MG PO TBEC: 40 MG | ORAL | Qty: 1

## 2021-12-24 MED FILL — DEXAMETHASONE 4 MG PO TABS: 4 MG | ORAL | Qty: 2

## 2021-12-24 MED FILL — AMLODIPINE BESYLATE 5 MG PO TABS: 5 MG | ORAL | Qty: 1

## 2021-12-24 MED FILL — OLUMIANT 2 MG PO TABS: 2 MG | ORAL | Qty: 2

## 2021-12-24 MED FILL — MELATONIN 3 MG PO TABS: 3 MG | ORAL | Qty: 1

## 2021-12-24 MED FILL — PEG 3350 17 G PO PACK: 17 g | ORAL | Qty: 1

## 2021-12-24 MED FILL — AZITHROMYCIN 250 MG PO TABS: 250 MG | ORAL | Qty: 2

## 2021-12-24 NOTE — Care Coordination-Inpatient (Signed)
Patient chart reviewed for continued stay.  Per MD, pt currently at 2L 02 NC and not medically ready for discharge at this time. When medically ready for discharge, patient should discharge home with family.      PT/OT evals recommend no needs at discharge.    Will continue to follow patient's plan of care and assist further with supportive care needs as appropriate.

## 2021-12-24 NOTE — Progress Notes (Signed)
Physician Progress Note      PATIENT:               MELBOURNE, JAKUBIAK  CSN #:                  [id number]  DOB:                       [birth year]  ADMIT DATE:       12/19/2021 10:43 PM  [facility] DATE:  RESPONDING  PROVIDER #:        Burna Forts MD          QUERY TEXT:    Pt admitted with pneumonia.  Pt tested + for COVID on 12/8. If possible,   please document in the progress notes and discharge summary if you are   evaluating and/or treating any of the following:      Note: CAP and HCAP indicate where the pneumonia was acquired, not a specific   type.    The medical record reflects the following:  Risk Factors: COVID  Clinical Indicators: CT chest, Diffuse multifocal areas of interstitial and   alveolar infiltrates  consistent with atypical pneumonia. Which can be seen in patients with COVID  Treatment: Maxipime. Rocephin.  Options provided:  -- Pneumonia due to COVID  -- Other - I will add my own diagnosis  -- Disagree - Not applicable / Not valid  -- Disagree - Clinically unable to determine / Unknown  -- Refer to Clinical Documentation Reviewer    PROVIDER RESPONSE TEXT:    This patient has pneumonia due to [place] created by: Waylan Boga on 12/24/2021 11:01 AM      Electronically signed by:  Burna Forts MD 12/24/2021 1:57 PM

## 2021-12-24 NOTE — Progress Notes (Signed)
Hospitalist Progress Note   Admit Date:  12/19/2021 10:43 PM   Name:  Jeffrey Wright   Age:  61 y.o.  Sex:  male  DOB:  06/19/1960   MRN:  [id number]   Room:  823/01    Presenting/Chief Complaint: No chief complaint on file.     Reason(s) for Admission: Acute pneumonia [J18.9]     Hospital Course:   Jeffrey Wright is a 61 y.o. male with medical history of HTN, HLD, GERD who presented with  continued cough, fever and malaise. Dx with COVID 12/8. Continues returning to ED since that time with co symptoms related to COVID-12/8, 12/9, 12/13, 12/15.  Prior to that he was seen in ED 11/10 and 11/12 for other co.  He had a PCP but states was dismissed related to not showing up to appt.      He moved from [facility] and lives in [place] with significant other, states was seeing pain management but dismissed for unknown reason, stated takes Hydrocodone 10 mg but has not had in "7-8 months". Prescribed Robaxin by PCP.  Chart reviewed, per PCP note 11/8"  "61 year male who presents today for delayed followup.  He was last seen on 07/07/21 at which time I ordered labs and a lumbar MRI and and abdominal US.  He was noncompliant with labs and MRI and ultrasound.  He was to have returned to see me in 08/2021 but was noncompliant with follow up.  He failed to keep many office visits between 08/2021 and now.  Today he presents with numerous requests. "     In ED today D dimer noted 0.72, sats mid to high 90s on RA, no CP. Does endorse cough with nausea. Afebrile, no leukocytosis, renal function normal limits.     Course during the stay-   Covid persistent cough-elevated d-dimer- repeat d-dimer normal  Initially continued on Decadron even though he was not hypoxic but was still having persistent cough, continued on Rocephin and Zithromax.  As his cough was not getting better did order CT chest, reviewed with pulmonary, diffuse COVID.  Pulmonary recommended lidocaine nebs which Started.  Patient then  developed  intermittent  fever, change antibiotics to cefepime and doxycycline.  Started on barcitinib on 12/20- secondary to elevated crp, pt on oxygen since yesterday 12/19- pt ok for baricitnib        Subjective & 24hr Events:   12/16  C/o persistent cough- discussed about elevated d-dimer  On room air, in no resp distress at rest, not tachycardic  Duplex lower ext pending    12/17  Having persistent cough, on Hycodan, D-dimer within the normal range, venous Doppler bilateral lower extremity negative for DVT  Added as needed meds    12/18  Still having persistent  dry cough  Will   order ct chest non contrast  Fever of 101.8 this evening    12/19  Patient had CT chest yesterday, reviewed that with pulmonary, felt covid changes, as patient still complaining of cough recommended lidocaine nebs  Patient alert with temperature 101.1 F, discontinue Rocephin, started on cefepime  Blood cultures ordered yesterday no growth for now    12/20  Still c/o cough  Crp 15.3,started on baricitinib today  Has been requiring 2 lit/min since 12/19      Assessment & Plan:      Covid- diagnosed 12/8  Persistent cough   Added decadron 6 mg q daily  Not hypoxic  12/17-continue Hycodan as needed, as  needed albuterol nebs, add Tessalon Perles  12/18- persistent cough- ordered  ct chest  12/19-Patient had CT chest yesterday, reviewed that with pulmonary, felt covid changes, as patient still complaining of cough recommended lidocaine nebs  12/20- started baricitinib secondary to elevated crp and requiring oxygne 2 lit/min since yesterday evening, pt ok to continue baricitinib    Pneumonia  Rocephin, Zithromax  12/19-with episode of fever, discontinue Rocephin started on cefepime, doxycyclin       Active Problems:  Chronic back pain  Plan: was seeing pain management but dismissed for unknown reason, stated takes Hydrocodone 10 mg but has not had in "7-8 months". Prescribed Robaxin by PCP.  12/16- didnt c/o  back pain             GERD (gastroesophageal reflux  disease)  Plan: PPI     Elevated Alk phos  Plan  [place] ordered outpt that patient was non compliant in getting  Monitor and address outpt, no abd pain       Hypertension, essential  Plan: Norvasc resumed       Hyperlipidemia, mixed  Plan: diet controlled       Medically noncompliant  Plan: education  He states needing PCP but I do not see where Dr. Luana Shu dismissed him from his practice, instead, patient has been non compliant with SEVERAL things regarding his care  *in fact, 12/14 Dr. Luana Shu was renewing Norvasc 12/14 and instructed him to go to ED if feeling poorly from COVID             Elevated d-dimer  Plan: states allergy to contrast dye, "my throat felt like it was closing" when given early 2000s  VQ scan  Doppler bilat legs  12/16- elevated d-dimer prob secondary to covid rather then blood clot- pt not hypoxic, not tachycardic, no resp distress at rest. Venous doppler pending  12/17-repeat D-dimer within normal range, venous Doppler bilateral lower extremity negative for DVT       BPH (benign prostatic hyperplasia)  Plan: does not take meds           PT/OT evals and PPD needed/ordered?  Yes  Diet: ADULT DIET; Regular  VTE prophylaxis: Lovenox  Code status: Full Code  Code status: Full Code      Non-peripheral Lines and Tubes (if present):          Telemetry (if present):  Cardiac/Telemetry Monitor On: Portable telemetry pack applied        Hospital Problems:  Principal Problem:    Pneumonia  Active Problems:    GERD (gastroesophageal reflux disease)    Hypertension, essential    Hyperlipidemia, mixed    Medically noncompliant    COVID    Elevated d-dimer    BPH (benign prostatic hyperplasia)    Acute pneumonia  Resolved Problems:    * No resolved hospital problems. *      Objective:   Patient Vitals for the past 24 hrs:   Temp Pulse Resp BP SpO2   12/24/21 1507 97.7 F (36.5 C) 69 18 116/67 91 %   12/24/21 1045 98.3 F (36.8 C) 77 18 118/82 91 %   12/24/21 1042 -- -- -- -- (!) 88 %   12/24/21 0834 -- -- -- --  96 %   12/24/21 0724 97.5 F (36.4 C) 67 19 111/70 94 %   12/24/21 0625 97.7 F (36.5 C) 73 18 118/71 96 %   12/24/21 0204 -- -- -- -- 91 %   12/24/21  0200 -- -- -- -- (!) 88 %   12/24/21 0145 -- -- -- -- 93 %   12/24/21 0058 99.3 F (37.4 C) 93 18 122/78 (!) 89 %   12/23/21 2050 98.8 F (37.1 C) 92 18 117/76 95 %   12/23/21 2022 -- 97 18 -- 96 %       Oxygen Therapy  SpO2: 91 %  Pulse Oximeter Device Mode: Continuous  O2 Device: Nasal cannula  O2 Flow Rate (L/min): 2 L/min    Estimated body mass index is 27.67 kg/m as calculated from the following:    Height as of this encounter: 1.753 m (5' 9").    Weight as of this encounter: 85 kg (187 lb 6.3 oz).    Intake/Output Summary (Last 24 hours) at 12/24/2021 1701  Last data filed at 12/24/2021 1507  Gross per 24 hour   Intake 1200 ml   Output --   Net 1200 ml         Physical Exam:     General:    Well nourished.  Persistent cough  Head:  Normocephalic, atraumatic  Eyes:  Sclerae appear normal.  Pupils equally round.  ENT:  Nares appear normal.  Moist oral mucosa  Neck:  No restricted ROM.  Trachea midline   CV:   RRR.  No m/r/g.  No jugular venous distension.  Lungs:   Coarse breath sounds  Abdomen:   Soft, nontender, nondistended.  Extremities: No cyanosis or clubbing.  No edema  Skin:     No rashes and normal coloration.   Warm and dry.    Neuro:  CN II-XII grossly intact.    Psych:  Normal mood and affect.      I have personally reviewed labs and tests:  Recent Labs:  Recent Results (from the past 48 hour(s))   Culture, Blood 1    Collection Time: 12/22/21  5:18 PM    Specimen: Blood   Result Value Ref Range    Special Requests LEFT  HAND        Culture NO GROWTH 2 DAYS     Culture, Blood 1    Collection Time: 12/22/21  6:04 PM    Specimen: Blood   Result Value Ref Range    Special Requests LEFT  HAND        Culture NO GROWTH 2 DAYS     CBC with Auto Differential    Collection Time: 12/23/21  3:57 AM   Result Value Ref Range    WBC 6.1 4.3 - 11.1 K/uL    RBC  4.21 (L) 4.23 - 5.6 M/uL    Hemoglobin 13.3 (L) 13.6 - 17.2 g/dL    Hematocrit 40.0 (L) 41.1 - 50.3 %    MCV 95.0 82 - 102 FL    MCH 31.6 26.1 - 32.9 PG    MCHC 33.3 31.4 - 35.0 g/dL    RDW 12.9 11.9 - 14.6 %    Platelets 237 150 - 450 K/uL    MPV 9.4 9.4 - 12.3 FL    nRBC 0.00 0.0 - 0.2 K/uL    Differential Type AUTOMATED      Neutrophils % 67 43 - 78 %    Lymphocytes % 16 13 - 44 %    Monocytes % 16 (H) 4.0 - 12.0 %    Eosinophils % 0 (L) 0.5 - 7.8 %    Basophils % 0 0.0 - 2.0 %    Immature Granulocytes 1 0.0 -  5.0 %    Neutrophils Absolute 4.1 1.7 - 8.2 K/UL    Lymphocytes Absolute 1.0 0.5 - 4.6 K/UL    Monocytes Absolute 1.0 0.1 - 1.3 K/UL    Eosinophils Absolute 0.0 0.0 - 0.8 K/UL    Basophils Absolute 0.0 0.0 - 0.2 K/UL    Absolute Immature Granulocyte 0.1 0.0 - 0.5 K/UL   Basic Metabolic Panel w/ Reflex to MG    Collection Time: 12/23/21  3:57 AM   Result Value Ref Range    Sodium 138 136 - 146 mmol/L    Potassium 3.8 3.5 - 5.1 mmol/L    Chloride 103 103 - 113 mmol/L    CO2 29 21 - 32 mmol/L    Anion Gap 6 2 - 11 mmol/L    Glucose 109 (H) 65 - 100 mg/dL    BUN 11 8 - 23 MG/DL    Creatinine 0.70 (L) 0.8 - 1.5 MG/DL    Est, Glom Filt Rate >60 >60 ml/min/1.70m    Calcium 8.5 8.3 - 10.4 MG/DL   Culture, Respiratory    Collection Time: 12/23/21 11:10 AM    Specimen: Sputum Expectorated   Result Value Ref Range    Special Requests NO SPECIAL REQUESTS      Gram stain FEW WBCS SEEN      Gram stain FEW EPITHELIAL CELLS SEEN      Gram stain FEW Gram positive cocci      Gram stain MANY Gram negative rods      Gram stain 2+ MUCOUS      Culture CULTURE IN PROGRESS,FURTHER UPDATES TO FOLLOW     CBC with Auto Differential    Collection Time: 12/24/21  4:41 AM   Result Value Ref Range    WBC 6.1 4.3 - 11.1 K/uL    RBC 4.23 4.23 - 5.6 M/uL    Hemoglobin 13.6 13.6 - 17.2 g/dL    Hematocrit 40.7 (L) 41.1 - 50.3 %    MCV 96.2 82 - 102 FL    MCH 32.2 26.1 - 32.9 PG    MCHC 33.4 31.4 - 35.0 g/dL    RDW 12.8 11.9 - 14.6 %     Platelets 246 150 - 450 K/uL    MPV 8.9 (L) 9.4 - 12.3 FL    nRBC 0.00 0.0 - 0.2 K/uL    Differential Type AUTOMATED      Neutrophils % 68 43 - 78 %    Lymphocytes % 13 13 - 44 %    Monocytes % 17 (H) 4.0 - 12.0 %    Eosinophils % 0 (L) 0.5 - 7.8 %    Basophils % 0 0.0 - 2.0 %    Immature Granulocytes 2 0.0 - 5.0 %    Neutrophils Absolute 4.1 1.7 - 8.2 K/UL    Lymphocytes Absolute 0.8 0.5 - 4.6 K/UL    Monocytes Absolute 1.1 0.1 - 1.3 K/UL    Eosinophils Absolute 0.0 0.0 - 0.8 K/UL    Basophils Absolute 0.0 0.0 - 0.2 K/UL    Absolute Immature Granulocyte 0.1 0.0 - 0.5 K/UL   Basic Metabolic Panel w/ Reflex to MG    Collection Time: 12/24/21  4:41 AM   Result Value Ref Range    Sodium 137 136 - 146 mmol/L    Potassium 4.0 3.5 - 5.1 mmol/L    Chloride 103 103 - 113 mmol/L    CO2 28 21 - 32 mmol/L    Anion Gap 6 2 -  11 mmol/L    Glucose 109 (H) 65 - 100 mg/dL    BUN 15 8 - 23 MG/DL    Creatinine 0.60 (L) 0.8 - 1.5 MG/DL    Est, Glom Filt Rate >60 >60 ml/min/1.74m    Calcium 8.8 8.3 - 10.4 MG/DL   Procalcitonin    Collection Time: 12/24/21  4:41 AM   Result Value Ref Range    Procalcitonin 0.14 0.00 - 0.49 ng/mL   C-Reactive Protein    Collection Time: 12/24/21  4:41 AM   Result Value Ref Range    CRP 15.3 (H) 0.0 - 0.9 mg/dL       Current Meds:  Current Facility-Administered Medications   Medication Dose Route Frequency    baricitinib (OLUMIANT) tablet 4 mg  4 mg Oral Daily    doxycycline hyclate (VIBRAMYCIN) capsule 100 mg  100 mg Oral BID    ceFEPIme (MAXIPIME) 2,000 mg in sodium chloride 0.9 % 100 mL IVPB (mini-bag)  2,000 mg IntraVENous Q8H    dexAMETHasone (DECADRON) tablet 6 mg  6 mg Oral Daily    medicated lip ointment (BLISTEX)   Topical PRN    benzonatate (TESSALON) capsule 100 mg  100 mg Oral TID PRN    cetirizine (ZYRTEC) tablet 10 mg  10 mg Oral Daily    traMADol (ULTRAM) tablet 50 mg  50 mg Oral Q6H PRN    oxyCODONE-acetaminophen (PERCOCET) 5-325 MG per tablet 1 tablet  1 tablet Oral Q8H PRN     guaiFENesin (ROBITUSSIN) 100 MG/5ML liquid 200 mg  200 mg Oral Q4H PRN    amLODIPine (NORVASC) tablet 5 mg  5 mg Oral Nightly    HYDROcodone homatropine (HYCODAN) 5-1.5 MG/5ML solution 5 mL  5 mL Oral Q4H PRN    sodium chloride flush 0.9 % injection 5-40 mL  5-40 mL IntraVENous 2 times per day    sodium chloride flush 0.9 % injection 5-40 mL  5-40 mL IntraVENous PRN    0.9 % sodium chloride infusion   IntraVENous PRN    potassium chloride (KLOR-CON M) extended release tablet 40 mEq  40 mEq Oral PRN    Or    potassium bicarb-citric acid (EFFER-K) effervescent tablet 40 mEq  40 mEq Oral PRN    Or    potassium chloride 10 mEq/100 mL IVPB (Peripheral Line)  10 mEq IntraVENous PRN    magnesium sulfate 2000 mg in 50 mL IVPB premix  2,000 mg IntraVENous PRN    enoxaparin (LOVENOX) injection 40 mg  40 mg SubCUTAneous Daily    ondansetron (ZOFRAN-ODT) disintegrating tablet 4 mg  4 mg Oral Q8H PRN    Or    ondansetron (ZOFRAN) injection 4 mg  4 mg IntraVENous Q6H PRN    polyethylene glycol (GLYCOLAX) packet 17 g  17 g Oral Daily PRN    acetaminophen (TYLENOL) tablet 650 mg  650 mg Oral Q6H PRN    Or    acetaminophen (TYLENOL) suppository 650 mg  650 mg Rectal Q6H PRN    melatonin tablet 3 mg  3 mg Oral Nightly    methocarbamol (ROBAXIN) tablet 750 mg  750 mg Oral 4x Daily    pantoprazole (PROTONIX) tablet 40 mg  40 mg Oral QAM AC       Signed:  SBurna Forts MD

## 2021-12-24 NOTE — Progress Notes (Signed)
RFA PIV inflammation noted. Unable to obtain new IV access x 2 attempts. Vascular access team notified. Patient yelled profanity while refusing vascular access services when they arrived to patient's room.

## 2021-12-25 LAB — CBC WITH AUTO DIFFERENTIAL
Absolute Immature Granulocyte: 0.1 10*3/uL (ref 0.0–0.5)
Basophils %: 0 % (ref 0.0–2.0)
Basophils Absolute: 0 10*3/uL (ref 0.0–0.2)
Eosinophils %: 0 % — ABNORMAL LOW (ref 0.5–7.8)
Eosinophils Absolute: 0 10*3/uL (ref 0.0–0.8)
Hematocrit: 39.5 % — ABNORMAL LOW (ref 41.1–50.3)
Hemoglobin: 13.3 g/dL — ABNORMAL LOW (ref 13.6–17.2)
Immature Granulocytes: 2 % (ref 0.0–5.0)
Lymphocytes %: 17 % (ref 13–44)
Lymphocytes Absolute: 1.1 10*3/uL (ref 0.5–4.6)
MCH: 32 PG (ref 26.1–32.9)
MCHC: 33.7 g/dL (ref 31.4–35.0)
MCV: 95.2 FL (ref 82–102)
MPV: 9.5 FL (ref 9.4–12.3)
Monocytes %: 13 % — ABNORMAL HIGH (ref 4.0–12.0)
Monocytes Absolute: 0.9 10*3/uL (ref 0.1–1.3)
Neutrophils %: 68 % (ref 43–78)
Neutrophils Absolute: 4.7 10*3/uL (ref 1.7–8.2)
Platelets: 344 10*3/uL (ref 150–450)
RBC: 4.15 M/uL — ABNORMAL LOW (ref 4.23–5.6)
RDW: 12.8 % (ref 11.9–14.6)
WBC: 6.9 10*3/uL (ref 4.3–11.1)
nRBC: 0 10*3/uL (ref 0.0–0.2)

## 2021-12-25 LAB — CULTURE, RESPIRATORY: Culture: NORMAL

## 2021-12-25 LAB — BASIC METABOLIC PANEL W/ REFLEX TO MG FOR LOW K
Anion Gap: 5 mmol/L (ref 2–11)
BUN: 17 MG/DL (ref 8–23)
CO2: 28 mmol/L (ref 21–32)
Calcium: 8.9 MG/DL (ref 8.3–10.4)
Chloride: 105 mmol/L (ref 103–113)
Creatinine: 0.6 MG/DL — ABNORMAL LOW (ref 0.8–1.5)
Est, Glom Filt Rate: >60 mL/min/{1.73_m2} (ref >60)
Glucose: 131 mg/dL — ABNORMAL HIGH (ref 65–100)
Potassium: 4.3 mmol/L (ref 3.5–5.1)
Sodium: 138 mmol/L (ref 136–146)

## 2021-12-25 MED ORDER — LIDOCAINE HCL (PF) 4 % IJ SOLN
4 % | Freq: Two times a day (BID) | INTRAMUSCULAR | Status: DC | PRN
Start: 2021-12-25 — End: 2021-12-25
  Administered 2021-12-25: 14:00:00 4 mL via RESPIRATORY_TRACT

## 2021-12-25 MED ORDER — CEFDINIR 300 MG PO CAPS
300 MG | ORAL_CAPSULE | Freq: Two times a day (BID) | ORAL | 0 refills | Status: DC
Start: 2021-12-25 — End: 2021-12-25

## 2021-12-25 MED ORDER — GUAIFENESIN-DM 100-10 MG/5ML PO SYRP
100-10 MG/5ML | ORAL | 0 refills | Status: AC | PRN
Start: 2021-12-25 — End: ?

## 2021-12-25 MED ORDER — GUAIFENESIN-DM 100-10 MG/5ML PO SYRP
100-10 MG/5ML | ORAL | Status: DC
Start: 2021-12-25 — End: 2021-12-25
  Administered 2021-12-25: 15:00:00 10 mL via ORAL

## 2021-12-25 MED ORDER — DEXAMETHASONE 6 MG PO TABS
6 MG | ORAL_TABLET | Freq: Every day | ORAL | 0 refills | Status: DC
Start: 2021-12-25 — End: 2021-12-25

## 2021-12-25 MED ORDER — CEFDINIR 300 MG PO CAPS
300 MG | Freq: Two times a day (BID) | ORAL | Status: DC
Start: 2021-12-25 — End: 2021-12-25
  Administered 2021-12-25: 15:00:00 300 mg via ORAL

## 2021-12-25 MED ORDER — BARICITINIB 4 MG PO TABS
4 MG | ORAL_TABLET | Freq: Every day | ORAL | 0 refills | Status: DC
Start: 2021-12-25 — End: 2021-12-25

## 2021-12-25 MED ORDER — CEFDINIR 300 MG PO CAPS
300 MG | ORAL_CAPSULE | Freq: Two times a day (BID) | ORAL | 0 refills | Status: AC
Start: 2021-12-25 — End: 2021-12-26

## 2021-12-25 MED ORDER — DOXYCYCLINE HYCLATE 100 MG PO CAPS
100 MG | ORAL_CAPSULE | Freq: Two times a day (BID) | ORAL | 0 refills | Status: AC
Start: 2021-12-25 — End: 2021-12-26

## 2021-12-25 MED ORDER — BARICITINIB 4 MG PO TABS
4 MG | ORAL_TABLET | Freq: Every day | ORAL | 0 refills | Status: AC
Start: 2021-12-25 — End: 2022-01-06

## 2021-12-25 MED ORDER — DOXYCYCLINE HYCLATE 100 MG PO CAPS
100 MG | ORAL_CAPSULE | Freq: Two times a day (BID) | ORAL | 0 refills | Status: DC
Start: 2021-12-25 — End: 2021-12-25

## 2021-12-25 MED ORDER — DEXAMETHASONE 6 MG PO TABS
6 MG | ORAL_TABLET | Freq: Every day | ORAL | 0 refills | Status: AC
Start: 2021-12-25 — End: 2021-12-29

## 2021-12-25 MED FILL — HYCODAN 5-1.5 MG/5ML PO SOLN: ORAL | Qty: 5

## 2021-12-25 MED FILL — LOVENOX 40 MG/0.4ML IJ SOSY: 40 MG/0.4ML | INTRAMUSCULAR | Qty: 0.4

## 2021-12-25 MED FILL — CEFEPIME HCL 2 G IV SOLR: 2 g | INTRAVENOUS | Qty: 2

## 2021-12-25 MED FILL — BENZONATATE 100 MG PO CAPS: 100 MG | ORAL | Qty: 1

## 2021-12-25 MED FILL — DEXAMETHASONE 4 MG PO TABS: 4 MG | ORAL | Qty: 2

## 2021-12-25 MED FILL — OLUMIANT 2 MG PO TABS: 2 MG | ORAL | Qty: 2

## 2021-12-25 MED FILL — MELATONIN 3 MG PO TABS: 3 MG | ORAL | Qty: 1

## 2021-12-25 MED FILL — METHOCARBAMOL 500 MG PO TABS: 500 MG | ORAL | Qty: 2

## 2021-12-25 MED FILL — AMLODIPINE BESYLATE 5 MG PO TABS: 5 MG | ORAL | Qty: 1

## 2021-12-25 MED FILL — CETIRIZINE HCL 10 MG PO TABS: 10 MG | ORAL | Qty: 1

## 2021-12-25 MED FILL — PANTOPRAZOLE SODIUM 40 MG PO TBEC: 40 MG | ORAL | Qty: 1

## 2021-12-25 MED FILL — DOXYCYCLINE HYCLATE 100 MG PO CAPS: 100 MG | ORAL | Qty: 1

## 2021-12-25 MED FILL — GUAIFENESIN-DM 100-10 MG/5ML PO SYRP: 100-10 MG/5ML | ORAL | Qty: 10

## 2021-12-25 MED FILL — CEFDINIR 300 MG PO CAPS: 300 MG | ORAL | Qty: 1

## 2021-12-25 MED FILL — LIDOCAINE HCL (PF) 4 % IJ SOLN: 4 % | INTRAMUSCULAR | Qty: 5

## 2021-12-25 NOTE — Care Coordination-Inpatient (Deleted)
Addended.

## 2021-12-25 NOTE — Progress Notes (Signed)
Pt on Room Air at rest SAT-91%.  Pt on Room Air up and walking SAT-85%.  Pt on 1L while up walking SAT-87%.  Pt on 2L while up walking SAT-89%.  Pt on 3L while up walking SAT-91%.  Pt on Room Air back to bed and at rest SAT-91%

## 2021-12-25 NOTE — Discharge Summary (Addendum)
Hospitalist Discharge Summary   Admit Date:  12/19/2021 10:43 PM   DC Note date: 12/25/2021  Name:  Jeffrey Wright   Age:  61 y.o.  Sex:  male  DOB:  07-May-1960   MRN:  [id number]   Room:  823/01  PCP:  No, Pcp    Presenting Complaint: No chief complaint on file.     Initial Admission Diagnosis: Acute pneumonia [J18.9]     Problem List for this Hospitalization (present on admission):    Principal Problem:    Pneumonia due to COVID-19 virus  Active Problems:    GERD (gastroesophageal reflux disease)    Hypertension, essential    Hyperlipidemia, mixed    Medically noncompliant    BPH (benign prostatic hyperplasia)  Resolved Problems:    * No resolved hospital problems. *      Hospital Course:  De Jaworski is a 61 y.o. male with medical history of HTN, HLD, GERD who presented with  continued cough, fever and malaise. Dx with COVID 12/8. Continues returning to ED since that time with co symptoms related to COVID-12/8, 12/9, 12/13, 12/15.  Prior to that he was seen in ED 11/10 and 11/12 for other co.  He had a PCP but states was dismissed related to not showing up to appt.      He moved from [facility] and lives in [place] with significant other, states was seeing pain management but dismissed for unknown reason, stated takes Hydrocodone 10 mg but has not had in "7-8 months". Prescribed Robaxin by PCP.  Chart reviewed, per PCP note 11/8"  "61 year male who presents today for delayed followup.  He was last seen on 07/07/21 at which time I ordered labs and a lumbar MRI and and abdominal US.  He was noncompliant with labs and MRI and ultrasound.  He was to have returned to see me in 08/2021 but was noncompliant with follow up.  He failed to keep many office visits between 08/2021 and now.  Today he presents with numerous requests. "     In ED today D dimer noted 0.72, sats mid to high 90s on RA, no CP. Does endorse cough with nausea. Afebrile, no leukocytosis, renal function normal limits.      Course during the  stay-   Covid persistent cough-elevated d-dimer- repeat d-dimer normal  Initially continued on Decadron even though he was not hypoxic but was still having persistent cough, continued on Rocephin and Zithromax.  As his cough was not getting better did order CT chest, reviewed with pulmonary, diffuse COVID.  Pulmonary recommended lidocaine nebs which Started.  Patient then  developed  intermittent fever, change antibiotics to cefepime and doxycycline.  Started on barcitinib on 12/20- secondary to elevated crp, pt on oxygen since 12/19- pt agreed to baricitnib.   Uptodate says to continue 14d or until hospital discharge; will do the former.   PCT was 0.14 not elevated.    Labs have been good and vitals stable.  On 2L at rest but have not been formally evaluated by RT; might be able to wean.  Oxygen eval today and will arrange home oxygen if needed.   Therapy said no rehab needs.   Pt does not want to go home yet (says he wants his cough better before he goes home) but I am hard pressed to find a valid medical reason he has to stay in hospital.   I explained that it is not in his best interest to stay  in hospital bed when he can get same treatments at home.    Disposition: Home  Diet: ADULT DIET; Regular  Code Status: Full Code    Follow Ups:   Contact information for follow-up providers     No, Pcp Follow up in 1 week(s).           [facility]. Call in 1 week(s).    Specialties: Engineer, manufacturing, In-Home Clinical Assessments  Contact information:  [address]  [place] [address]  [phone]                 Contact information for after-discharge care     Discharge Durable Medical Equipment     [facility] .    Service: Durable Medical Equipment  Contact information:  [id number]  [place] [address]  [phone]                           Time spent in patient discharge and coordination 35 minutes.        Follow up labs/diagnostics (ultimately defer to outpatient  provider):  CBC and BMP    Plan was discussed with pt.  All questions answered.  Patient was stable at time of discharge.  Instructions given to call a physician or return if any concerns.    Current Discharge Medication List        START taking these medications    Details   guaiFENesin-dextromethorphan (ROBITUSSIN DM) 100-10 MG/5ML syrup Take 10 mLs by mouth every 4 hours as needed for Cough  Qty: 120 mL, Refills: 0      Baricitinib 4 MG TABS Take 4 mg by mouth daily for 12 days  Qty: 12 tablet, Refills: 0      dexAMETHasone (DECADRON) 6 MG tablet Take 1 tablet by mouth daily for 4 days  Qty: 4 tablet, Refills: 0      doxycycline hyclate (VIBRAMYCIN) 100 MG capsule Take 1 capsule by mouth in the morning and at bedtime for 1 day  Qty: 2 capsule, Refills: 0      cefdinir (OMNICEF) 300 MG capsule Take 1 capsule by mouth every 12 hours for 1 day  Qty: 2 capsule, Refills: 0           CONTINUE these medications which have NOT CHANGED    Details   amLODIPine (NORVASC) 5 MG tablet Take 1 tablet by mouth daily  Qty: 30 tablet, Refills: 0    Associated Diagnoses: Hypertension, essential      ondansetron (ZOFRAN-ODT) 8 MG TBDP disintegrating tablet Place 1 tablet under the tongue every 8 hours as needed for Nausea or Vomiting  Qty: 12 tablet, Refills: 0      methocarbamol (ROBAXIN) 750 MG tablet Take 1 tablet by mouth 4 times daily  Qty: 120 tablet, Refills: 1    Associated Diagnoses: Chronic bilateral low back pain with right-sided sciatica      acyclovir (ZOVIRAX) 400 MG tablet Take 1 tablet by mouth in the morning and at bedtime  Qty: 60 tablet, Refills: 1    Associated Diagnoses: Recurrent genital herpes      omeprazole (PRILOSEC) 40 MG delayed release capsule Take 1 capsule by mouth daily  Qty: 90 capsule, Refills: 1    Associated Diagnoses: Gastroesophageal reflux disease, unspecified whether esophagitis present           STOP taking these medications       brompheniramine-pseudoephedrine-DM 2-30-10 MG/5ML  syrup  Comments:   Reason for Stopping:               Some of the medications may be marked as "stop taking" by the system; but in reality pt or family reported already being off these meds; defer to outpatient/prescribing providers.    Procedures done this admission:  * No surgery found *    Consults this admission:  IP CONSULT TO PHARMACY  IP CONSULT TO PRIMARY CARE PROVIDER  IP CONSULT HOME HEALTH    Echocardiogram results:  No results found for this or any previous visit.      Diagnostic Imaging/Tests:   CT CHEST WO CONTRAST    Result Date: 12/24/2021  Diffuse multifocal areas of interstitial and alveolar infiltrates consistent with atypical pneumonia. Which can be seen in patients with COVID.    Vascular duplex lower extremity venous bilateral    Result Date: 12/20/2021  1.  No evidence of deep venous thrombosis in the right lower extremity. 2.  No evidence of deep venous thrombosis in the left lower extremity. 3. No Baker's cyst in the posterior popliteal fossa bilaterally.     XR CHEST (2 VW)    Result Date: 12/19/2021  Findings concerning for bibasilar pneumonia.     XR CHEST (2 VW)    Result Date: 12/17/2021  No radiographic evidence of acute cardiopulmonary disease        Labs: Results:       BMP, Mg, Phos Recent Labs     12/23/21  0357 12/24/21  0441 12/25/21  0415   NA 138 137 138   K 3.8 4.0 4.3   CL 103 103 105   CO2 29 28 28    ANIONGAP 6 6 5    BUN 11 15 17    CREATININE 0.70* 0.60* 0.60*   LABGLOM >60 >60 >60   CALCIUM 8.5 8.8 8.9   GLUCOSE 109* 109* 131*      CBC Recent Labs     12/23/21  0357 12/24/21  0441 12/25/21  0415   WBC 6.1 6.1 6.9   RBC 4.21* 4.23 4.15*   HGB 13.3* 13.6 13.3*   HCT 40.0* 40.7* 39.5*   MCV 95.0 96.2 95.2   MCH 31.6 32.2 32.0   MCHC 33.3 33.4 33.7   RDW 12.9 12.8 12.8   PLT 237 246 344   MPV 9.4 8.9* 9.5   NRBC 0.00 0.00 0.00   LYMPHOPCT 16 13 17    EOSRELPCT 0* 0* 0*   MONOPCT 16* 17* 13*   BASOPCT 0 0 0   IMMGRAN 1 2 2    LYMPHSABS 1.0 0.8 1.1   EOSABS 0.0 0.0 0.0   MONOSABS 1.0 1.1  0.9   BASOSABS 0.0 0.0 0.0   ABSIMMGRAN 0.1 0.1 0.1      LFT No results for input(s): "BILITOT", "BILIDIR", "ALKPHOS", "AST", "ALT", "PROT", "LABALBU", "GLOB" in the last 72 hours.   Cardiac  No results found for: "NTPROBNP", "TROPHS"   Coags No results found for: "PROTIME", "INR", "APTT"   A1c No results found for: "LABA1C", "EAG"   Lipids Lab Results   Component Value Date/Time    CHOL 222 06/13/2020 04:21 PM    LDLCALC 150.8 06/13/2020 04:21 PM    LABVLDL 42.2 06/13/2020 04:21 PM    HDL 29 06/13/2020 04:21 PM    CHOLHDLRATIO 7.7 06/13/2020 04:21 PM    TRIG 211 06/13/2020 04:21 PM      Thyroid  Lab Results   Component Value  Date/Time    TSH3GEN 2.280 06/13/2020 04:21 PM        Most Recent UA No results found for: "COLORU", "APPEARANCE", "SPECGRAV", "LABPH", "PROTEINU", "GLUCOSEU", "KETUA", "BILIRUBINUR", "BLOODU", "UROBILINOGEN", "NITRU", "LEUKOCYTESUR", "WBCUA", "RBCUA", "EPITHUA", "BACTERIA", "LABCAST", "MUCUS"     Microbiology:  Results       Procedure Component Value Units Date/Time    Culture, Respiratory [[id number]] Collected: 12/23/21 1110    Order Status: Completed Specimen: Sputum Expectorated Updated: 12/25/21 0725     Special Requests NO SPECIAL REQUESTS        Gram stain FEW WBCS SEEN         FEW EPITHELIAL CELLS SEEN         FEW Gram positive cocci         MANY Gram negative rods         2+ MUCOUS        Culture       HEAVY NORMAL RESPIRATORY FLORA          Culture, Blood 1 [[id number]] Collected: 12/22/21 1804    Order Status: Completed Specimen: Blood Updated: 12/25/21 1103     Special Requests --        LEFT  HAND       Culture NO GROWTH 3 DAYS       Culture, Blood 1 [[id number]] Collected: 12/22/21 1718    Order Status: Completed Specimen: Blood Updated: 12/25/21 1103     Special Requests --        LEFT  HAND       Culture NO GROWTH 3 DAYS       S. pneumonia Antigen, Urine/CSF [[id number]] Collected: 12/19/21 2345    Order Status: Canceled Specimen: Urine     Blood Culture 1 [[id number]]  Collected: 12/19/21 1959    Order Status: Completed Specimen: Blood Updated: 12/24/21 0723     Special Requests --        LEFT  HAND       Culture NO GROWTH 5 DAYS       Blood Culture 1 [[id number]] Collected: 12/12/21 1807    Order Status: Completed Specimen: Blood Updated: 12/18/21 0809     Special Requests --        NO SPECIAL REQUESTS  RIGHT  FOREARM       Culture NO GROWTH 5 DAYS       COVID-19, Rapid [[id number]]  (Abnormal) Collected: 12/12/21 1807    Order Status: Completed Specimen: Nasopharyngeal Updated: 12/12/21 1831     Source Nasopharyngeal        SARS-CoV-2, Rapid Detected        Comment:    The specimen is POSITIVE for SARS-CoV-2, the novel coronavirus associated with COVID-19.     This test has been authorized by the FDA under an Emergency Use Authorization (EUA) for use by authorized laboratories.      Fact sheet for Healthcare Providers: LittleDVDs.dk  Fact sheet for Patients: SatelliteRebate.it     Methodology: Isothermal Nucleic Acid Amplification         Influenza A/B, Molecular [[id number]] Collected: 12/12/21 1807    Order Status: Completed Specimen: Nasopharyngeal Updated: 12/12/21 1837     Influenza A, NAA Not detected        Comment: Negative results do not preclude infection with influenza virus and should not be the sole basis of a patient treatment decision.        Influenza B, NAA Not detected  All Labs from Last 24 Hrs:  Recent Results (from the past 24 hour(s))   CBC with Auto Differential    Collection Time: 12/25/21  4:15 AM   Result Value Ref Range    WBC 6.9 4.3 - 11.1 K/uL    RBC 4.15 (L) 4.23 - 5.6 M/uL    Hemoglobin 13.3 (L) 13.6 - 17.2 g/dL    Hematocrit 13.2 (L) 41.1 - 50.3 %    MCV 95.2 82 - 102 FL    MCH 32.0 26.1 - 32.9 PG    MCHC 33.7 31.4 - 35.0 g/dL    RDW 44.0 10.2 - 72.5 %    Platelets 344 150 - 450 K/uL    MPV 9.5 9.4 - 12.3 FL    nRBC 0.00 0.0 - 0.2 K/uL    Differential Type AUTOMATED      Neutrophils  % 68 43 - 78 %    Lymphocytes % 17 13 - 44 %    Monocytes % 13 (H) 4.0 - 12.0 %    Eosinophils % 0 (L) 0.5 - 7.8 %    Basophils % 0 0.0 - 2.0 %    Immature Granulocytes 2 0.0 - 5.0 %    Neutrophils Absolute 4.7 1.7 - 8.2 K/UL    Lymphocytes Absolute 1.1 0.5 - 4.6 K/UL    Monocytes Absolute 0.9 0.1 - 1.3 K/UL    Eosinophils Absolute 0.0 0.0 - 0.8 K/UL    Basophils Absolute 0.0 0.0 - 0.2 K/UL    Absolute Immature Granulocyte 0.1 0.0 - 0.5 K/UL   Basic Metabolic Panel w/ Reflex to MG    Collection Time: 12/25/21  4:15 AM   Result Value Ref Range    Sodium 138 136 - 146 mmol/L    Potassium 4.3 3.5 - 5.1 mmol/L    Chloride 105 103 - 113 mmol/L    CO2 28 21 - 32 mmol/L    Anion Gap 5 2 - 11 mmol/L    Glucose 131 (H) 65 - 100 mg/dL    BUN 17 8 - 23 MG/DL    Creatinine 3.66 (L) 0.8 - 1.5 MG/DL    Est, Glom Filt Rate >60 >60 ml/min/1.37m2    Calcium 8.9 8.3 - 10.4 MG/DL       Allergies   Allergen Reactions    Amoxicillin Rash    Codeine Nausea And Vomiting       There is no immunization history on file for this patient.    Recent Vital Data:  Patient Vitals for the past 24 hrs:   Temp Pulse Resp BP SpO2   12/25/21 1124 97.3 F (36.3 C) 81 18 118/73 95 %   12/25/21 0755 97.7 F (36.5 C) 65 18 119/78 94 %   12/25/21 0601 97.2 F (36.2 C) 59 18 118/76 --   12/25/21 0048 98.1 F (36.7 C) 82 18 124/81 95 %   12/24/21 2052 98.2 F (36.8 C) 98 18 118/75 94 %   12/24/21 1507 97.7 F (36.5 C) 69 18 116/67 91 %       Oxygen Therapy  SpO2: 95 %  Pulse Oximeter Device Mode: Intermittent  O2 Device: None (Room air)  O2 Flow Rate (L/min): 2 L/min    Estimated body mass index is 27.67 kg/m as calculated from the following:    Height as of this encounter: 1.753 m ( ).    Weight as of this encounter: 85 kg (187 lb 6.3 oz).    Intake/Output  Summary (Last 24 hours) at 12/25/2021 1353  Last data filed at 12/24/2021 1731  Gross per 24 hour   Intake 600 ml   Output --   Net 600 ml         Physical Exam:    General:    Well nourished.   No overt distress  Head:  Normocephalic, atraumatic  Eyes:  Sclerae appear normal.  Pupils equally round.    HENT:  Nares appear normal, no drainage.  Moist mucous membranes  Neck:  No restricted ROM.  Trachea midline  CV:   RRR.   No JVD  Lungs:   Coarse bilat.  Respirations even, unlabored  Abdomen:    nondistended.    Extremities: Warm and dry.   No edema.    Skin:     No rashes.  Normal coloration  Neuro:  CN II-XII grossly intact.  Psych:  Normal mood and affect.    Signed:  Gloriann Loan, MD    Part of this note may have been written by using a voice dictation software.  The note has been proof read but may still contain some grammatical/other typographical errors.

## 2021-12-25 NOTE — Psychotherapy (Deleted)
Discharge instructions reviewed with patient and wife. Patient provided with O2 tank, O2 tubing, discharge paperwork and copy of PCP referral. All new medications reviewed, all questions answered. Pt instructed to wear 3LNC while OOB and none while at rest. Patient transported in wheelchair with O2 tank to discharge area, wife transported home.

## 2021-12-25 NOTE — Progress Notes (Addendum)
Discharge instructions reviewed with patient and wife. Patient provided with O2 tank, O2 tubing, discharge paperwork and copy of PCP referral. All new medications reviewed, all questions answered. Pt instructed to wear 3LNC while OOB and none while at rest, according to RT walk test performed. Patient transported in wheelchair with O2 tank to discharge area, wife transported pt home.

## 2021-12-25 NOTE — Care Coordination-Inpatient (Signed)
Pt is for discharge home today with family. Pt and spouse reported to CM not in agreement with discharge. CM explained to pt and spouse, pt medically stable for discharge per MD. MD and RN aware. CM completed referral to [facility] for PCP follow-up, [facility] for RN, and DME of 02 through [facility].        12/25/21 Jeffrey Wright is:   (significant other listed)   Social/Functional History   Lives With Significant other   Home Equipment None   Receives Help From Friend(s)   ADL Assistance Independent   Homemaking Assistance Independent   Homemaking Responsibilities No   Ambulation Assistance Independent   Secretary/administrator Yes   Services At/After Discharge   Transition of Care Consult ([facility]) Discharge Planning;DME/Supply [facility] Discharge DME   Mode of Transport at Discharge Other (see comment)  (family)   Confirm Follow Up Transport Family   Condition of Participation: Discharge Planning   The Plan for Transition of Care is related to the following treatment goals: Pt to discharge home with DME of 02 with family.   The Patient and/or Patient Representative was provided with a Choice of Provider? Patient   The Patient and/Or Patient Representative agree with the Discharge Plan? No   Freedom of Choice list was provided with basic dialogue that supports the patient's individualized plan of care/goals, treatment preferences, and shares the quality data associated with the providers?  Yes

## 2021-12-25 NOTE — Plan of Care (Signed)
Problem: Discharge Planning  Goal: Discharge to home or other facility with appropriate resources  Outcome: Adequate for Discharge  Flowsheets (Taken 12/25/2021 0755)  Discharge to home or other facility with appropriate resources: Identify barriers to discharge with patient and caregiver

## 2021-12-27 LAB — CULTURE, BLOOD 1
Culture: NO GROWTH
Culture: NO GROWTH

## 2022-01-06 ENCOUNTER — Emergency Department: Payer: MEDICAID

## 2022-01-06 ENCOUNTER — Encounter

## 2022-01-06 ENCOUNTER — Emergency Department: Admit: 2022-01-07 | Payer: MEDICAID

## 2022-01-06 DIAGNOSIS — J189 Pneumonia, unspecified organism: Secondary | ICD-10-CM

## 2022-01-06 DIAGNOSIS — R0789 Other chest pain: Secondary | ICD-10-CM

## 2022-01-06 NOTE — ED Triage Notes (Signed)
Pt presents to the ER with EMS.  Pt accompianed by SO.  Pt and/or family reports cough, fever.  Pt reports he was diagnosed with PNA today and is on zpak. Pt reports being admitted approx 1 week ago with COVID and is on home oxygen.

## 2022-01-06 NOTE — ED Provider Notes (Incomplete)
Emergency Department Provider Note       PCP: No, Pcp   Age: 62 y.o.   Sex: male     DISPOSITION       No diagnosis found.    Medical Decision Making     Complexity of Problems Addressed:  1 or more acute illnesses that pose a threat to life or bodily function.     Data Reviewed and Analyzed:   I independently ordered and reviewed each unique test.  {external source:53765}   {Historian (state who, why needed, what they said):56592}    I independently ordered and interpreted the ED EKG in the absence of a Cardiologist.    Rate: 85  EKG Interpretation: EKG Interpretation: sinus rhythm, no evidence of arrhythmia  ST Segments: Normal ST segments - NO STEMI   Normal EKG  I interpreted the X-rays bilateral patchy perihilar infiltrates, seems improved overall when compared with chest x-ray from 3 weeks.    Discussion of management or test interpretation.  ***  {Admitted or Consultants involved.:58337}    Risk of Complications and/or Morbidity of Patient Management:  {ZOXW:96045}         Is this patient to be included in the SEP-1 core measure due to severe sepsis or septic shock? {SEP1 yes/no:60681}     History      Patient diagnosed with COVID and hospitalized in mid to late December for COVID.  Currently finished Zithromax yesterday and still on cefdinir for recent diagnosis of pneumonia.  Patient on 3 L nasal cannula since discharge from hospital for COVID.  Tonight at dinner he felt lightheaded and began having chest tightness and shortness of breath.  For several days since hospital discharge so perhaps longer he has had leg swelling.  He denies past medical history of MI, stenting, bypass surgery or congestive heart failure           Review of Systems   Constitutional:  Positive for fever. Negative for chills.   HENT:  Negative for congestion and rhinorrhea.    Respiratory:  Positive for cough and shortness of breath.    Cardiovascular:  Positive for chest pain and leg swelling.   Gastrointestinal:  Negative for  abdominal pain, diarrhea, nausea and vomiting.   Endocrine: Negative for polydipsia and polyuria.   Genitourinary:  Negative for dysuria, frequency and hematuria.   Musculoskeletal:  Negative for back pain and myalgias.   Skin:  Negative for color change and rash.   Neurological:  Negative for weakness and numbness.   All other systems reviewed and are negative.      Physical Exam     Vitals signs and nursing note reviewed:  Vitals:    01/06/22 2005   BP: (!) 151/106   Pulse: 100   Resp: 19   Temp: 99.3 F (37.4 C)   TempSrc: Oral   SpO2: 98%   Weight: 86.2 kg (190 lb)   Height: 1.753 m (5\' 9" )      Physical Exam  Vitals and nursing note reviewed.   Constitutional:       Appearance: Normal appearance.   HENT:      Head: Normocephalic and atraumatic.      Nose: Nose normal.      Mouth/Throat:      Mouth: Mucous membranes are moist.   Eyes:      Conjunctiva/sclera: Conjunctivae normal.      Pupils: Pupils are equal, round, and reactive to light.   Neck:  Comments: Hepatojugular reflux  Cardiovascular:      Rate and Rhythm: Normal rate and regular rhythm.      Pulses: Normal pulses.      Heart sounds: Normal heart sounds.   Pulmonary:      Effort: Pulmonary effort is normal.      Breath sounds: Normal breath sounds. No rhonchi or rales.   Abdominal:      General: There is no distension.      Palpations: Abdomen is soft.      Tenderness: There is no abdominal tenderness. There is no guarding or rebound.   Musculoskeletal:         General: Normal range of motion.      Cervical back: Neck supple.      Right lower leg: Edema present.      Left lower leg: Edema present.   Lymphadenopathy:      Cervical: No cervical adenopathy.   Skin:     General: Skin is warm and dry.   Neurological:      Mental Status: He is alert and oriented to person, place, and time.   Psychiatric:         Behavior: Behavior normal.          Procedures     Procedures    Orders Placed This Encounter   Procedures    XR CHEST PORTABLE    CBC with  Auto Differential    Comprehensive Metabolic Panel    Magnesium    Troponin T    Brain Natriuretic Peptide    Cardiac Monitor - ED Only    Continuous Pulse Oximetry    Initiate Oxygen Therapy Protocol    EKG 12 Lead    Saline lock IV        Medications given during this emergency department visit:  Medications - No data to display    New Prescriptions    No medications on file        Past Medical History:   Diagnosis Date    BPH (benign prostatic hyperplasia)     Chronic low back pain with right-sided sciatica     History of herpes genitalis     Internal hemorrhoids     Venous insufficiency of both lower extremities         Past Surgical History:   Procedure Laterality Date    CERVICAL FUSION      anterior cervical diskectomy / fusion C4-5, C5-6    HX OPEN REDUCTION INTERNAL FIXATION      femur    SEPTOPLASTY  1983        Social History     Socioeconomic History    Marital status: Single   Tobacco Use    Smoking status: Never    Smokeless tobacco: Never   Substance and Sexual Activity    Alcohol use: Not Currently    Drug use: Never     Social Determinants of Health     Food Insecurity: No Food Insecurity (12/19/2021)    Hunger Vital Sign     Worried About Running Out of Food in the Last Year: Never true     Ran Out of Food in the Last Year: Never true   Transportation Needs: No Transportation Needs (12/19/2021)    PRAPARE - Therapist, art (Medical): No     Lack of Transportation (Non-Medical): No   Housing Stability: Low Risk  (12/19/2021)    Housing Stability Vital Sign  Unable to Pay for Housing in the Last Year: No     Number of Places Lived in the Last Year: 1     Unstable Housing in the Last Year: No        Previous Medications    ACYCLOVIR (ZOVIRAX) 400 MG TABLET    Take 1 tablet by mouth in the morning and at bedtime    AMLODIPINE (NORVASC) 5 MG TABLET    Take 1 tablet by mouth daily    BARICITINIB 4 MG TABS    Take 4 mg by mouth daily for 12 days    GUAIFENESIN-DEXTROMETHORPHAN  (ROBITUSSIN DM) 100-10 MG/5ML SYRUP    Take 10 mLs by mouth every 4 hours as needed for Cough    METHOCARBAMOL (ROBAXIN) 750 MG TABLET    Take 1 tablet by mouth 4 times daily    OMEPRAZOLE (PRILOSEC) 40 MG DELAYED RELEASE CAPSULE    Take 1 capsule by mouth daily    ONDANSETRON (ZOFRAN-ODT) 8 MG TBDP DISINTEGRATING TABLET    Place 1 tablet under the tongue every 8 hours as needed for Nausea or Vomiting        Results for orders placed or performed during the hospital encounter of 01/06/22   XR CHEST PORTABLE    Narrative    Chest X-ray    INDICATION: Shortness of breath.    COMPARISON: December 19, 2021.    AP/PA view of the chest was obtained.    FINDINGS: Interval developing bilateral perihilar interstitial groundglass  opacification more on the right than the left.  The heart size is normal.  The  bony thorax is intact. Linear atelectasis with Kerley B line at the left  lingular region. The mediastinum and hemidiaphragms are stable.      Impression    Interval developing bilateral perihilar pulmonary vascular  congestion.    No other significant changes compared to prior study with no evidence of  pneumonia or pneumothorax or pleural effusions.          XR CHEST PORTABLE   Final Result   Interval developing bilateral perihilar pulmonary vascular   congestion.      No other significant changes compared to prior study with no evidence of   pneumonia or pneumothorax or pleural effusions.                           Voice dictation software was used during the making of this note.  This software is not perfect and grammatical and other typographical errors may be present.  This note has not been completely proofread for errors.

## 2022-01-07 ENCOUNTER — Inpatient Hospital Stay: Admit: 2022-01-07 | Payer: MEDICAID

## 2022-01-07 ENCOUNTER — Inpatient Hospital Stay
Admit: 2022-01-07 | Discharge: 2022-01-07 | Disposition: A | Discharge status: Other Acute Inpatient Hospital | Payer: MEDICAID | Attending: Emergency Medicine

## 2022-01-07 ENCOUNTER — Emergency Department: Payer: MEDICAID

## 2022-01-07 ENCOUNTER — Inpatient Hospital Stay
Admit: 2022-01-07 | Discharge: 2022-01-07 | Disposition: A | Discharge status: Home / Self Care | Payer: MEDICAID | Attending: Emergency Medicine

## 2022-01-07 DIAGNOSIS — Z711 Person with feared health complaint in whom no diagnosis is made: Secondary | ICD-10-CM

## 2022-01-07 DIAGNOSIS — R053 Chronic cough: Secondary | ICD-10-CM

## 2022-01-07 LAB — CBC WITH AUTO DIFFERENTIAL
Absolute Immature Granulocyte: 0.1 10*3/uL (ref 0.0–0.5)
Basophils %: 1 % (ref 0.0–2.0)
Basophils Absolute: 0.1 10*3/uL (ref 0.0–0.2)
Eosinophils %: 2 % (ref 0.5–7.8)
Eosinophils Absolute: 0.1 10*3/uL (ref 0.0–0.8)
Hematocrit: 41.2 % (ref 41.1–50.3)
Hemoglobin: 13.6 g/dL (ref 13.6–17.2)
Immature Granulocytes: 1 % (ref 0.0–5.0)
Lymphocytes %: 16 % (ref 13–44)
Lymphocytes Absolute: 1.3 10*3/uL (ref 0.5–4.6)
MCH: 32.2 PG (ref 26.1–32.9)
MCHC: 33 g/dL (ref 31.4–35.0)
MCV: 97.6 FL (ref 82.0–102.0)
MPV: 8.8 FL — ABNORMAL LOW (ref 9.4–12.3)
Monocytes %: 10 % (ref 4.0–12.0)
Monocytes Absolute: 0.8 10*3/uL (ref 0.1–1.3)
Neutrophils %: 71 % (ref 43–78)
Neutrophils Absolute: 5.6 10*3/uL (ref 1.7–8.2)
Platelets: 337 10*3/uL (ref 150–450)
RBC: 4.22 M/uL — ABNORMAL LOW (ref 4.23–5.60)
RDW: 13.4 % (ref 11.9–14.6)
WBC: 7.8 10*3/uL (ref 4.3–11.1)
nRBC: 0 10*3/uL (ref 0.0–0.2)

## 2022-01-07 LAB — D-DIMER, QUANTITATIVE: D-Dimer, Quant: 0.6 ug/ml(FEU) — ABNORMAL HIGH (ref <0.56)

## 2022-01-07 LAB — COMPREHENSIVE METABOLIC PANEL
ALT: 89 U/L — ABNORMAL HIGH (ref 13.0–61.0)
AST: 33 U/L (ref 15–37)
Albumin/Globulin Ratio: 0.9 (ref 0.4–1.6)
Albumin: 3.6 g/dL (ref 3.2–4.6)
Alk Phosphatase: 269 U/L — ABNORMAL HIGH (ref 45.0–117.0)
Anion Gap: 11 mmol/L (ref 2–11)
BUN: 9 MG/DL (ref 8–23)
CO2: 26 mmol/L (ref 21–32)
Calcium: 9.6 MG/DL (ref 8.3–10.4)
Chloride: 104 mmol/L (ref 98–107)
Creatinine: 0.59 MG/DL — ABNORMAL LOW (ref 0.8–1.5)
Est, Glom Filt Rate: >60 mL/min/{1.73_m2} (ref >60)
Globulin: 3.8 g/dL (ref 2.8–4.5)
Glucose: 125 mg/dL — ABNORMAL HIGH (ref 65–100)
Potassium: 4.1 mmol/L (ref 3.5–5.1)
Sodium: 141 mmol/L (ref 133–143)
Total Bilirubin: 0.5 MG/DL (ref 0.2–1.1)
Total Protein: 7.4 g/dL (ref 6.4–8.2)

## 2022-01-07 LAB — TROPONIN T: Troponin T: 7 ng/L (ref 0–22)

## 2022-01-07 LAB — BRAIN NATRIURETIC PEPTIDE: NT Pro-BNP: 107 PG/ML (ref 0–450)

## 2022-01-07 LAB — MAGNESIUM: Magnesium: 2 mg/dL (ref 1.2–2.6)

## 2022-01-07 MED ORDER — TECHNETIUM 99M DTPA
Freq: Once | Status: AC | PRN
Start: 2022-01-07 — End: 2022-01-07
  Administered 2022-01-07: 13:00:00 39.6 via RESPIRATORY_TRACT

## 2022-01-07 MED ORDER — IOPAMIDOL 76 % IV SOLN
76 % | Freq: Once | INTRAVENOUS | Status: DC | PRN
Start: 2022-01-07 — End: 2022-01-07

## 2022-01-07 MED ORDER — BENZONATATE 200 MG PO CAPS
200 MG | ORAL_CAPSULE | Freq: Three times a day (TID) | ORAL | 0 refills | Status: AC | PRN
Start: 2022-01-07 — End: 2022-01-17

## 2022-01-07 MED ORDER — METHYLPREDNISOLONE SODIUM SUCC 125 MG IJ SOLR
125 | INTRAMUSCULAR | Status: AC
Start: 2022-01-07 — End: 2022-01-06
  Administered 2022-01-07: 05:00:00 40 mg via INTRAVENOUS

## 2022-01-07 MED ORDER — DIPHENHYDRAMINE HCL 50 MG/ML IJ SOLN
50 MG/ML | INTRAMUSCULAR | Status: AC
Start: 2022-01-07 — End: 2022-01-06
  Administered 2022-01-07: 05:00:00 25 mg via INTRAVENOUS

## 2022-01-07 MED ORDER — TECHNETIUM TO 99M ALBUMIN AGGREGATED
Freq: Once | Status: AC | PRN
Start: 2022-01-07 — End: 2022-01-07
  Administered 2022-01-07: 13:00:00 6.2 via INTRAVENOUS

## 2022-01-07 MED FILL — DIPHENHYDRAMINE HCL 50 MG/ML IJ SOLN: 50 MG/ML | INTRAMUSCULAR | Qty: 1

## 2022-01-07 MED FILL — METHYLPREDNISOLONE SODIUM SUCC 125 MG IJ SOLR: 125 MG | INTRAMUSCULAR | Qty: 125

## 2022-01-07 NOTE — ED Notes (Signed)
Patient escorted with this RN to CT due to patient's fear of feeling of choking during contrast injection. Upon getting the patient to CT with premeds on board, patient decided he was not comfortable getting the CT scan due to a "choking" sensation 10-15 years ago when he had a CT back then. Patient brought back to room and Dr. Bishop Limbo notified.

## 2022-01-07 NOTE — ED Notes (Signed)
I have reviewed discharge instructions with the patient.  The patient verbalized understanding.    Patient left ED via Discharge Method: ambulatory to Home with girlfriend  Opportunity for questions and clarification provided.       Patient given 1 scripts.         To continue your aftercare when you leave the hospital, you may receive an automated call from our care team to check in on how you are doing.  This is a free service and part of our promise to provide the best care and service to meet your aftercare needs." If you have questions, or wish to unsubscribe from this service please call [phone].  Thank you for Choosing our [facility] Emergency Department.       Annamarie Dawley, RN  01/07/22 0930

## 2022-01-07 NOTE — ED Triage Notes (Signed)
Patient arrives via EMS from [place] for a vq scan for r/o PE. Patient O2 sat 96% on room air.

## 2022-01-07 NOTE — Discharge Instructions (Addendum)
Use the Tessalon Perles as needed for cough and try doing the doorway stretching exercises for chest tightness.    You need to schedule follow-up appoint with your family doctor this week for repeat evaluation.

## 2022-01-07 NOTE — ED Provider Notes (Signed)
Emergency Department Provider Note       PCP: No, Pcp   Age: 62 y.o.   Sex: male     DISPOSITION Decision To Discharge 01/07/2022 09:01:23 AM       ICD-10-CM    1. Feared condition not demonstrated  Z71.1     NO PE NOTED      2. Chronic cough  R05.3           Medical Decision Making     Complexity of Problems Addressed:  1 or more chronic illnesses with a severe exacerbation or progression.    Data Reviewed and Analyzed:  I independently ordered and reviewed each unique test.             Discussion of management or test interpretation.  DDX:    Acute exacerbation asthma, COPD, CHF, COVID-19, influenza    Bronchitis, aspiration pneumonia, pneumonia,    PE, rib fracture, rib dysfunction, pleurisy, pneumothorax, aspiration of foreign body        Risk of Complications and/or Morbidity of Patient Management:  Prescription drug management performed.  Shared medical decision making was utilized in creating the patients health plan today.    ED Course as of 01/07/22 0902   Wed Jan 07, 2022   0900 I updated the patient regarding the findings here in the emergency department.  No acute emergent need for admission identified here in the ER.  I talked to him about how long COVID can cause symptoms on for months after having the acute disease.  We talked about using the Tessalon Perles and doing doorway stretching exercises. [KH]      ED Course User Index  [KH] Jeffrey Milch, DO       History       62 year old male diagnosed with COVID a couple of weeks ago, spent 6 days in the hospital, was subsequently discharged and as an outpatient placed on Zithromax and a cephalosporin.  Patient finished Zithromax yesterday and is still on the cephalosporin.  He went to the [facility] ER with chest discomfort and was found to have an elevated D-dimer and was sent here for VQ scan as he has a distant history of possible IV contrast dye allergy.  The understanding at this time is if the patient's VQ is negative he will be discharged  home on his current regimen.    The history is provided by the patient, the EMS personnel and a caregiver.        Review of Systems   Constitutional:  Negative for chills and fever.   Respiratory:  Positive for cough and shortness of breath.    Cardiovascular:  Positive for chest pain. Negative for palpitations and leg swelling.   Gastrointestinal:  Negative for abdominal pain, nausea and vomiting.       Physical Exam     Vitals signs and nursing note reviewed:  Vitals:    01/07/22 0714 01/07/22 0717 01/07/22 0722 01/07/22 0727   BP: 125/81      Pulse:       Resp:       Temp:       TempSrc:       SpO2: 93% 93% 93% 94%   Weight:       Height:          Physical Exam  Vitals and nursing note reviewed.   Constitutional:       General: He is not in acute distress.  HENT:  Head: Normocephalic and atraumatic.      Right Ear: External ear normal.      Left Ear: External ear normal.      Nose: Nose normal.      Mouth/Throat:      Mouth: Mucous membranes are moist.   Eyes:      Extraocular Movements: Extraocular movements intact.      Conjunctiva/sclera: Conjunctivae normal.      Pupils: Pupils are equal, round, and reactive to light.   Cardiovascular:      Rate and Rhythm: Normal rate and regular rhythm.      Heart sounds: No murmur heard.  Pulmonary:      Effort: Pulmonary effort is normal.      Breath sounds: Normal breath sounds.   Abdominal:      General: Bowel sounds are normal.      Palpations: Abdomen is soft.      Tenderness: There is no abdominal tenderness.   Musculoskeletal:         General: No tenderness. Normal range of motion.      Cervical back: Normal range of motion and neck supple.      Right lower leg: No edema.      Left lower leg: No edema.   Skin:     General: Skin is warm and dry.   Neurological:      General: No focal deficit present.      Mental Status: He is alert and oriented to person, place, and time.   Psychiatric:         Mood and Affect: Mood is anxious.         Behavior: Behavior normal.           Procedures     Procedures    Orders Placed This Encounter   Procedures    NM LUNG VENT/PERFUSION (VQ)        Medications given during this emergency department visit:  Medications - No data to display    New Prescriptions    BENZONATATE (TESSALON) 200 MG CAPSULE    Take 1 capsule by mouth 3 times daily as needed for Cough        Past Medical History:   Diagnosis Date    BPH (benign prostatic hyperplasia)     Chronic low back pain with right-sided sciatica     History of herpes genitalis     Internal hemorrhoids     Venous insufficiency of both lower extremities         Past Surgical History:   Procedure Laterality Date    CERVICAL FUSION      anterior cervical diskectomy / fusion C4-5, C5-6    HX OPEN REDUCTION INTERNAL FIXATION      femur    SEPTOPLASTY  1983        Social History     Socioeconomic History    Marital status: Single   Tobacco Use    Smoking status: Never    Smokeless tobacco: Never   Substance and Sexual Activity    Alcohol use: Not Currently    Drug use: Never     Social Determinants of Health     Food Insecurity: No Food Insecurity (12/19/2021)    Hunger Vital Sign     Worried About Running Out of Food in the Last Year: Never true     Ran Out of Food in the Last Year: Never true   Transportation Needs: No Transportation Needs (12/19/2021)    PRAPARE -  Therapist, art (Medical): No     Lack of Transportation (Non-Medical): No   Housing Stability: Low Risk  (12/19/2021)    Housing Stability Vital Sign     Unable to Pay for Housing in the Last Year: No     Number of Places Lived in the Last Year: 1     Unstable Housing in the Last Year: No        Previous Medications    ACYCLOVIR (ZOVIRAX) 400 MG TABLET    Take 1 tablet by mouth in the morning and at bedtime    AMLODIPINE (NORVASC) 5 MG TABLET    Take 1 tablet by mouth daily    GUAIFENESIN-DEXTROMETHORPHAN (ROBITUSSIN DM) 100-10 MG/5ML SYRUP    Take 10 mLs by mouth every 4 hours as needed for Cough    METHOCARBAMOL  (ROBAXIN) 750 MG TABLET    Take 1 tablet by mouth 4 times daily    OMEPRAZOLE (PRILOSEC) 40 MG DELAYED RELEASE CAPSULE    Take 1 capsule by mouth daily    ONDANSETRON (ZOFRAN-ODT) 8 MG TBDP DISINTEGRATING TABLET    Place 1 tablet under the tongue every 8 hours as needed for Nausea or Vomiting        Results for orders placed or performed during the hospital encounter of 01/07/22   NM LUNG VENT/PERFUSION (VQ)    Narrative    LUNG VENTILATION/PERFUSION SCINTIGRAPHY:            CLINICAL HISTORY: Short of breath, atypical chest pain, elevated D-dimer with  recent diagnosis of COVID suggesting pulmonary emboli    TECHNIQUE: 39.6 mCi of technetium 6m DTPA aerosol were administered by  inhalation, and ventilation images were obtained in eight obliquities.   Corresponding perfusion images were obtained after subsequent injection of 6.2  mCi of technetium 35m macroaggregated albumin IV.    COMPARISON: Chest x-ray 01/06/2022 suggested developing bilateral perihilar  pulmonary vascular congestion and bilateral perihilar groundglass opacities    FINDINGS: No moderate or large mismatched perfusion defect is seen to suggest  acute pulmonary embolism.      Impression    No pulmonary emboli.  Relatively good ventilation and perfusion.   Results for orders placed or performed during the hospital encounter of 01/06/22   XR CHEST PORTABLE    Narrative    Chest X-ray    INDICATION: Shortness of breath.    COMPARISON: December 19, 2021.    AP/PA view of the chest was obtained.    FINDINGS: Interval developing bilateral perihilar interstitial groundglass  opacification more on the right than the left.  The heart size is normal.  The  bony thorax is intact. Linear atelectasis with Kerley B line at the left  lingular region. The mediastinum and hemidiaphragms are stable.      Impression    Interval developing bilateral perihilar pulmonary vascular  congestion.    No other significant changes compared to prior study with no evidence  of  pneumonia or pneumothorax or pleural effusions.     CBC with Auto Differential   Result Value Ref Range    WBC 7.8 4.3 - 11.1 K/uL    RBC 4.22 (L) 4.23 - 5.60 M/uL    Hemoglobin 13.6 13.6 - 17.2 g/dL    Hematocrit 56.7 01.4 - 50.3 %    MCV 97.6 82.0 - 102.0 FL    MCH 32.2 26.1 - 32.9 PG    MCHC 33.0 31.4 - 35.0 g/dL  RDW 13.4 11.9 - 14.6 %    Platelets 337 150 - 450 K/uL    MPV 8.8 (L) 9.4 - 12.3 FL    nRBC 0.00 0.0 - 0.2 K/uL    Differential Type AUTOMATED      Neutrophils % 71 43 - 78 %    Lymphocytes % 16 13 - 44 %    Monocytes % 10 4.0 - 12.0 %    Eosinophils % 2 0.5 - 7.8 %    Basophils % 1 0.0 - 2.0 %    Immature Granulocytes 1 0.0 - 5.0 %    Neutrophils Absolute 5.6 1.7 - 8.2 K/UL    Lymphocytes Absolute 1.3 0.5 - 4.6 K/UL    Monocytes Absolute 0.8 0.1 - 1.3 K/UL    Eosinophils Absolute 0.1 0.0 - 0.8 K/UL    Basophils Absolute 0.1 0.0 - 0.2 K/UL    Absolute Immature Granulocyte 0.1 0.0 - 0.5 K/UL   Comprehensive Metabolic Panel   Result Value Ref Range    Sodium 141 133 - 143 mmol/L    Potassium 4.1 3.5 - 5.1 mmol/L    Chloride 104 98 - 107 mmol/L    CO2 26 21 - 32 mmol/L    Anion Gap 11 2 - 11 mmol/L    Glucose 125 (H) 65 - 100 mg/dL    BUN 9 8 - 23 MG/DL    Creatinine 0.59 (L) 0.8 - 1.5 MG/DL    Est, Glom Filt Rate >60 >60 ml/min/1.26m2    Calcium 9.6 8.3 - 10.4 MG/DL    Total Bilirubin 0.5 0.2 - 1.1 MG/DL    ALT 89 (H) 13.0 - 61.0 U/L    AST 33 15 - 37 U/L    Alk Phosphatase 269 (H) 45.0 - 117.0 U/L    Total Protein 7.4 6.4 - 8.2 g/dL    Albumin 3.6 3.2 - 4.6 g/dL    Globulin 3.8 2.8 - 4.5 g/dL    Albumin/Globulin Ratio 0.9 0.4 - 1.6     Magnesium   Result Value Ref Range    Magnesium 2.0 1.2 - 2.6 mg/dL   Troponin T   Result Value Ref Range    Troponin T 7.0 0 - 22 ng/L   Brain Natriuretic Peptide   Result Value Ref Range    NT Pro-BNP 107 0 - 450 PG/ML   D-Dimer, Quantitative   Result Value Ref Range    D-Dimer, Quant 0.60 (H) <0.56 ug/ml(FEU)        NM LUNG VENT/PERFUSION (VQ)   Final Result   No  pulmonary emboli.   Relatively good ventilation and perfusion.                        Voice dictation software was used during the making of this note.  This software is not perfect and grammatical and other typographical errors may be present.  This note has not been completely proofread for errors.     Keene Breath, DO  01/07/22 [phone]

## 2022-01-07 NOTE — ED Notes (Signed)
TRANSFER - OUT REPORT:    Verbal report given to Joe on Roman Dubuc  being transferred to [facility] ED for ordered procedure       Report consisted of patient's Situation, Background, Assessment and   Recommendations(SBAR).     Information from the following report(s) Nurse Handoff Report, ED Encounter Summary, ED SBAR, [facility], and Recent Results was reviewed with the receiving nurse.    Lines:   Peripheral IV 01/06/22 Right Antecubital (Active)   Site Assessment Clean, dry & intact 01/06/22 2058   Line Status Blood return noted;Flushed;Normal saline locked;Specimen collected 01/06/22 2058   Phlebitis Assessment No symptoms 01/06/22 2058   Infiltration Assessment 0 01/06/22 2058   Dressing Status New dressing applied 01/06/22 2058   Dressing Type Transparent 01/06/22 2058        Opportunity for questions and clarification was provided.      Patient transported with:  [employer]

## 2022-01-07 NOTE — ED Notes (Signed)
PT taken to VQ scan at this time       Annamarie Dawley, RN  01/07/22 [phone]

## 2022-01-12 ENCOUNTER — Encounter: Payer: MEDICAID | Attending: Internal Medicine

## 2022-01-14 ENCOUNTER — Encounter

## 2022-03-30 ENCOUNTER — Encounter

## 2022-04-10 LAB — HEMOGLOBIN A1C
Estimated Avg Glucose, External: 126 mg/dL
Hemoglobin A1C, External: 6 % — ABNORMAL HIGH (ref 4.8–5.6)

## 2022-09-13 LAB — HEMOGLOBIN A1C
Estimated Avg Glucose, External: 120 mg/dL
Hemoglobin A1C, External: 5.8 % — ABNORMAL HIGH (ref <5.7)

## 2023-02-03 LAB — HEMOGLOBIN A1C
Estimated Avg Glucose, External: 123 mg/dL
Hemoglobin A1C, External: 5.9 % — ABNORMAL HIGH (ref 4.8–5.6)

## 2023-07-14 ENCOUNTER — Encounter
